# Patient Record
Sex: Female | Born: 1995 | Race: Black or African American | Hispanic: No | Smoking: Current every day smoker
Health system: Southern US, Community
[De-identification: ages and names within clinical notes are randomized; demographics above are authoritative.]

## PROBLEM LIST (undated history)

## (undated) ENCOUNTER — Emergency Department (HOSPITAL_COMMUNITY): Admission: EM | Payer: Medicaid Other | Source: Home / Self Care

## (undated) DIAGNOSIS — L309 Dermatitis, unspecified: Secondary | ICD-10-CM

## (undated) DIAGNOSIS — N83209 Unspecified ovarian cyst, unspecified side: Secondary | ICD-10-CM

## (undated) DIAGNOSIS — F32A Depression, unspecified: Secondary | ICD-10-CM

## (undated) DIAGNOSIS — F329 Major depressive disorder, single episode, unspecified: Secondary | ICD-10-CM

## (undated) DIAGNOSIS — T7840XA Allergy, unspecified, initial encounter: Secondary | ICD-10-CM

## (undated) DIAGNOSIS — J45909 Unspecified asthma, uncomplicated: Secondary | ICD-10-CM

## (undated) DIAGNOSIS — F419 Anxiety disorder, unspecified: Secondary | ICD-10-CM

## (undated) HISTORY — PX: OVARIAN CYST REMOVAL: SHX89

## (undated) HISTORY — DX: Unspecified ovarian cyst, unspecified side: N83.209

## (undated) HISTORY — DX: Allergy, unspecified, initial encounter: T78.40XA

---
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
## 2013-01-17 ENCOUNTER — Encounter: Payer: Self-pay | Admitting: Pediatrics

## 2013-01-17 ENCOUNTER — Ambulatory Visit (INDEPENDENT_AMBULATORY_CARE_PROVIDER_SITE_OTHER): Payer: Self-pay | Admitting: Clinical

## 2013-01-17 ENCOUNTER — Ambulatory Visit (INDEPENDENT_AMBULATORY_CARE_PROVIDER_SITE_OTHER): Payer: Self-pay | Admitting: Pediatrics

## 2013-01-17 VITALS — BP 98/60 | Ht 63.47 in | Wt 119.3 lb

## 2013-01-17 DIAGNOSIS — R319 Hematuria, unspecified: Secondary | ICD-10-CM

## 2013-01-17 DIAGNOSIS — L209 Atopic dermatitis, unspecified: Secondary | ICD-10-CM

## 2013-01-17 DIAGNOSIS — G4700 Insomnia, unspecified: Secondary | ICD-10-CM

## 2013-01-17 DIAGNOSIS — L2089 Other atopic dermatitis: Secondary | ICD-10-CM

## 2013-01-17 DIAGNOSIS — F411 Generalized anxiety disorder: Secondary | ICD-10-CM

## 2013-01-17 DIAGNOSIS — R69 Illness, unspecified: Secondary | ICD-10-CM

## 2013-01-17 DIAGNOSIS — Z113 Encounter for screening for infections with a predominantly sexual mode of transmission: Secondary | ICD-10-CM

## 2013-01-17 LAB — POCT URINALYSIS DIPSTICK: Urobilinogen, UA: NEGATIVE

## 2013-01-17 MED ORDER — TRIAMCINOLONE ACETONIDE 0.1 % EX CREA: TOPICAL_CREAM | Freq: Two times a day (BID) | CUTANEOUS | Status: DC

## 2013-01-17 NOTE — Patient Instructions (Addendum)
Eczema Atopic dermatitis, or eczema, is an inherited type of sensitive skin. Often people with eczema have a family history of allergies, asthma, or hay fever. It causes a red itchy rash and dry scaly skin. The itchiness may occur before the skin rash and may be very intense. It is not contagious. Eczema is generally worse during the cooler winter months and often improves with the warmth of summer. Eczema usually starts showing signs in infancy. Some children outgrow eczema, but it may last through adulthood. Flare-ups may be caused by:  Eating something or contact with something you are sensitive or allergic to.  Stress. DIAGNOSIS  The diagnosis of eczema is usually based upon symptoms and medical history. TREATMENT  Eczema cannot be cured, but symptoms usually can be controlled with treatment or avoidance of allergens (things to which you are sensitive or allergic to).  Controlling the itching and scratching.  Use over-the-counter antihistamines as directed for itching. It is especially useful at night when the itching tends to be worse.  Use over-the-counter steroid creams as directed for itching.  Scratching makes the rash and itching worse and may cause impetigo (a skin infection) if fingernails are contaminated (dirty).  Keeping the skin well moisturized with creams every day. This will seal in moisture and help prevent dryness. Lotions containing alcohol and water can dry the skin and are not recommended.  Limiting exposure to allergens.  Recognizing situations that cause stress.  Developing a plan to manage stress. HOME CARE INSTRUCTIONS   Take prescription and over-the-counter medicines as directed by your caregiver.  Do not use anything on the skin without checking with your caregiver.  Keep baths or showers short (5 minutes) in warm (not hot) water. Use mild cleansers for bathing. You may add non-perfumed bath oil to the bath water. It is best to avoid soap and bubble  bath.  Immediately after a bath or shower, when the skin is still damp, apply a moisturizing ointment to the entire body. This ointment should be a petroleum ointment. This will seal in moisture and help prevent dryness. The thicker the ointment the better. These should be unscented.  Keep fingernails cut short and wash hands often. If your child has eczema, it may be necessary to put soft gloves or mittens on your child at night.  Dress in clothes made of cotton or cotton blends. Dress lightly, as heat increases itching.  Avoid foods that may cause flare-ups. Common foods include cow's milk, peanut butter, eggs and wheat.  Keep a child with eczema away from anyone with fever blisters. The virus that causes fever blisters (herpes simplex) can cause a serious skin infection in children with eczema. SEEK MEDICAL CARE IF:   Itching interferes with sleep.  The rash gets worse or is not better within one week following treatment.  The rash looks infected (pus or soft yellow scabs).  You or your child has an oral temperature above 102 F (38.9 C).  Your baby is older than 3 months with a rectal temperature of 100.5 F (38.1 C) or higher for more than 1 day.  The rash flares up after contact with someone who has fever blisters. SEEK IMMEDIATE MEDICAL CARE IF:   Your baby is older than 3 months with a rectal temperature of 102 F (38.9 C) or higher.  Your baby is older than 3 months or younger with a rectal temperature of 100.4 F (38 C) or higher. Document Released: 06/06/2000 Document Revised: 09/01/2011 Document Reviewed: 04/11/2009 ExitCare  Patient Information 2014 [place], [place].  **Use Kenalog (triamcinolone cream) twice daily for flares. Do not use this on the face. You can use over the counter hydrocortisone cream on the face if needed. Continue to use Eucerin and Vaseline after showers. **You will be contacted with abnormal results.

## 2013-01-17 NOTE — Progress Notes (Addendum)
CC: New patient, eczema all over  HPI: Deanna [name] is a 17 yo F w/ h/o eczema since infancy who presents with diffuse eczematous rash. Per patient, she has previously been managed with Kenalog and prednisone prescribed in the ER during flares (last one month ago). As an infant, her eczema was diffuse. As she grew up, it improved to involve only her elbows, back of knees, and neck. Now it has been more diffuse for last year, involving the usual places and also now the scalp, back, face. She has been seen in the ED one month ago where she was prescribed Kenalog and a short course of steroids. These helped when she used them, but she has since run out of the medications. The symptoms returned within 1-2 weeks. She does report drainage from the rashes. It mostly appears bloody but sometimes has pus. Denies fevers but reports night sweats, which aren't new. One year ago she started using new laundry detergent. No other environmental changes. When she showers, she uses unscented [employer]. After showering, she uses Eucerin and Vaseline. She generally showers every three days, which she started doing when the eczema got worse. This has not helped.   She also reports difficulty sleeping for several years. She states that she is up through the night and goes to sleep for about two hours in the mornings. This is a change from six months ago when she slept for about four hours per day. When she is awake at night, she is often anxious. She worries some for her mother, who "goes clubbing a lot in dangerous places where she could get shot." She feels tired during the day. She sometimes takes 4x Benadryl tablets to help her sleep. She did this yesterday AM and slept till 10 PM. She says that she drinks coffee infrequently and otherwise avoids caffeine. She does take "cat naps" throughout the day. She saw a therapist for this issue about 3-4 years ago. She was diagnosed with depression and was prescribed some medicines "for  sleep", which helped. She cannot recall the name of these medications. Her father's side of the family suffers from insomnia. Both parents carry the diagnosis of bipolar disorder.   She has been wetting the bed x2 weeks. She has not had incontinence during the day though she reports urgency and frequency. She has clots when she pees during the day for the last week. No dysuria. Reports monthly periods that last 5 days at a time. Not currently on her period.  ROS: Increased thirst, weight gain.  PMH: Past Medical History  Diagnosis Date  . Eczema   . Asthma   . Allergy   . Ovarian cyst     resolved     PSH: Past Surgical History  Procedure Laterality Date  . Ovarian cyst removal       MEDICATIONS: Outpatient Encounter Prescriptions as of 01/17/2013  Medication Sig Dispense Refill  . albuterol (PROVENTIL HFA;VENTOLIN HFA) 108 (90 BASE) MCG/ACT inhaler Inhale 2 puffs into the lungs every 6 (six) hours as needed for wheezing or shortness of breath.       . triamcinolone cream (KENALOG) 0.1 % Apply topically 2 (two) times daily.  30 g  0   No facility-administered encounter medications on file as of 01/17/2013.     ALLERGIES: Allergies  Allergen Reactions  . Dairy Aid (Lactase) Hives       . Penicillins Rash     FH: Family History  Problem Relation Age of Onset  .  Asthma Mother   . Bipolar disorder Mother   . Asthma Father   . Hypertension Father   . Bipolar disorder Father   . Hypertension Paternal Grandmother   . Diabetes Paternal Grandmother   . Asthma Paternal Grandmother      SH: History   Social History  . Marital Status: Single   Occupational History  . Student   Social History Main Topics  . Smoking status: Never Smoker   . Alcohol Use: No  . Drug Use: No  . Sexually Active: Not Currently -- Female partner(s)     PHYSICAL EXAM: VS: BP 98/60  Ht 5' 3.47" (1.612 m)  Wt 119 lb 4.3 oz (54.1 kg)  BMI 20.82 kg/m2 Physical Examination: General  appearance - alert, well appearing, and in no distress Mental status - alert, oriented to person, place, and time, somewhat flat affect Eyes - pupils equal and reactive, extraocular eye movements intact, sclera anicteric Nose - normal and patent, no erythema, discharge or polyps Mouth - mucous membranes moist, pharynx normal without lesions Neck - supple, no significant adenopathy Lymphatics - no palpable lymphadenopathy Chest - clear to auscultation, no wheezes, rales or rhonchi, symmetric air entry Heart - normal rate, regular rhythm, normal S1, S2, no murmurs, rubs, clicks or gallops Abdomen - NABS, soft, nontender, nondistended, no masses or organomegaly Skin - multiple eczematous patches on posterior neck, back, chest, abdomen, arms, axillae, knees bilaterally; few excoriated areas; no purulence  SCREENING: PHQ-9: 22 - has had thoughts that she would be better off dead- last was 2 weeks ago, no plan  LABS: U/A (01/17/13): - Color: red - Clarity: cloudy - SG: 1.030 - pH: 5 - LE: 2+ - Nitrite: negative - Protein: 2+ - Glucose: normal - Ketones: neg - Urobilinogen: neg - Bilirubin: neg - Blood: 250  ASSESSMENT AND PLAN: Deanna [name] is a 17 yo F w/ h/o asthma and eczema who presents today for eczema flare and to establish care. On further history, she also suffers from significant insomnia, anxiety, and depression. She also has symptoms and labs c/w UTI.  **Atopic dermatitis: Diffuse atopic dermatitis. No e/o superimposed bacterial infection. - Prescribed Kenalog cream to be used twice daily during flares - Facial atopic dermatitis is relatively mild; recommended OTC hydrocortisone BID prn. If no improvement, can try more potent topical steroid. - Encouraged her to continue to use Eucerin and Vaseline.  **UTI, nocturnal enuresis: Symptoms and labs c/w UTI. Hematuria is likely related to those, though abnormal menstruation could also be confused for hematuria. STI could also be  causing vaginal bleeding that could be confused for hematuria. Nocturnal enuresis could be caused by UTI, diabetes mellitus, medications (though she is not currently taking any medications or substances that should cause enuresis), psychiatric issues. No glucose in the urine, which is reassuring.  - Urinalysis result returned after she had been discharged from the office. Cipro 250 mg BID x3 days has been prescribed. I attempted to reach the patient at the number listed in Epic [phone]) and one on file at the pharmacy [phone]). Neither number was connected anymore. Patient had not yet picked up her Kenalog prescription, so she will hopefully be able to pick up the Cipro as well. She is to be seen in clinic on 8/8 for f/u. - Urine culture sent - Screening for GC/Chlamydia  - Consider screening for DM in the future following treatment for UTI if nocturnal enuresis persists. No glucosuria, which is reassuring.  **Anxiety/depression: Based on her PHQ-9  score, she has severe depression. She has had passive suicidal thoughts, last two weeks ago, but is not currently having suicidal thoughts and has no plans. She has seen a therapist in the past and is not currently interested in pursuing therapy. Her friend is on an anti-depressant, which is not helping, so Deanna [name] thinks that medication may not be helpful for her either. She met with [facility] today, who helped obtain this information. - Follow-up in 1 week with Deanna [name] for physical and to check in. She will also meet with Deanna [name] at that appointment. - She will be referred to adolescent medicine (Deanna [name]) at that visit for assistance with further management of anxiety and depression.  **Insomnia: Suspect that this is related to anxiety and depression as well as poor sleep hygiene, including daytime naps. - Discussed importance of good sleep hygiene; however, I believe that this will only improve with treatment of underlying depression and  anxiety.  **Healthcare maintenance: - She is agreeable to screening for GC/Chlamydia - F/u 8/8 for physical exam  Deanna [name], M.D. Med-Peds PGY-3        ADDENDUM: Urine culture results reviewed. Culture was positive for E. Coli s/t ciprofloxacin, which was the antibiotic prescribed on 7/28.

## 2013-01-17 NOTE — Progress Notes (Signed)
Referring Provider: Dr. Leotis Shames & Dr. Renae Gloss Length of visit: 3:30pm -4:00pm (30 min)  PRESENTING CONCERNS:  Yasenia presented for an acute visit for her eczema.  During the visit, she shared with the physician her depressive symptoms including feeling depressed, difficulty sleeping, and little interest in doing things.  Gresia reported she cannot sleep because of her eczema.  GOALS:  Increase her hours of sleep by treating her eczema.  INTERVENTIONS:  LCSW built rapport and assessed for current symptoms of depression & anxiety.  LCSW had Emile complete the PHQ-SADS & reviewed it with her.  LCSW assessed for suicide risk and informed her of the various resources available to her including the crisis centers in [place].  LCSW also obtained information from Kenzie's girlfriend who was present, with Mandie's permission.  SCREENS/ASSESSMENT TOOLS COMPLETED: PHQ-SADS Results PHQ-15 for Somatic Complaints = 13 (High) GAD-7 for Anxiety = 7 (Moderate) PHQ-9 for Depression = 22 (Severe) Anxiety Attacks = None Reported it is "extremely difficult" for her to do her work, take care of things at home or get along with other people.   OUTCOME:  Haadiya was completing the PHQ-SADS when LCSW entered the room.  Becka was having difficulty reading the questions and had Gwen, her girlfriend, help her.  Novia reported she's had difficulty concentrating and has had difficulty in school with reading.  Esra is currently not in school, she dropped out of [employer].  She is living with [facility] & Gwen's mother.  Jazell reported she's been depressed since 8th grade when she was diagnosed with depression.  Remmi reported she had therapy at that time and was advised to also take medications but she chose not to since she thought it would make her "retarded."  Sarely thought that if she took the medication she would have to be in special classes because of her anger problems at that time.   Kharlie reported she does not remember what type of medication it was.  Philamena reported severe symptoms of depression.  Zenora did report thoughts of hurting herself about two weeks ago but denied any current suicidal ideations.  Alaysiah denied any attempts to hurt herself and any plans to do it.  Jadalynn reported she wants to get more sleep and it would help if her eczema was treated.  Damoni did not want counseling at this time but open to LCSW following up with her at the next visit.    Drucilla was open to establishing care at [facility] with a primary care physician and consulting with the Adolescent Specialist.  PLAN:  Scheduled a physical exam with Dr. Jena Gauss and a follow up with this LCSW on 01/28/13.  LCSW to assess further for major depressive disorder.

## 2013-01-18 LAB — GC/CHLAMYDIA PROBE AMP: GC Probe RNA: NEGATIVE

## 2013-01-18 NOTE — Progress Notes (Signed)
I saw and evaluated this patient,performing key elements of the service.I developed the management plan that is described in Dr Pandelidis's note,and I agree with the content.  Olakunle B. Camay Pedigo, MD  

## 2013-01-18 NOTE — Progress Notes (Signed)
I saw and evaluated this patient,performing key elements of the service.I developed the management plan that is described in Dr Pandelidis's note,and I agree with the content.  Olakunle B. Bronwyn Belasco, MD  

## 2013-01-20 LAB — URINE CULTURE

## 2013-01-28 ENCOUNTER — Ambulatory Visit: Payer: Self-pay | Admitting: Pediatrics

## 2013-01-28 ENCOUNTER — Other Ambulatory Visit: Payer: Self-pay | Admitting: Clinical

## 2013-02-04 ENCOUNTER — Telehealth: Payer: Self-pay | Admitting: Pediatrics

## 2013-02-04 NOTE — Telephone Encounter (Signed)
Pt was due for f/u appt with me on 8/8 and No Showed.  Called pt to re-schedule appointment and confirm that she received cipro rx for E.coli UTI.  Was unable to reach her but did reach her mother (Ms. Slezak) and discussed that she missed an appointment for a physical exam and I wanted to confirm she was able to pick up her prescription (I only mentioned eczema cream); her mother stated that she will remind patient to call and re-schedule PE and f/u re: meds.

## 2013-02-16 ENCOUNTER — Encounter: Payer: Self-pay | Admitting: Pediatrics

## 2013-10-25 ENCOUNTER — Emergency Department (HOSPITAL_COMMUNITY)
Admission: EM | Admit: 2013-10-25 | Discharge: 2013-10-25 | Disposition: A | Discharge status: Home / Self Care | Payer: Medicaid Other | Attending: Emergency Medicine | Admitting: Emergency Medicine

## 2013-10-25 ENCOUNTER — Encounter (HOSPITAL_COMMUNITY): Payer: Self-pay | Admitting: Emergency Medicine

## 2013-10-25 DIAGNOSIS — Z8742 Personal history of other diseases of the female genital tract: Secondary | ICD-10-CM | POA: Insufficient documentation

## 2013-10-25 DIAGNOSIS — J45909 Unspecified asthma, uncomplicated: Secondary | ICD-10-CM | POA: Insufficient documentation

## 2013-10-25 DIAGNOSIS — Z880 Allergy status to penicillin: Secondary | ICD-10-CM | POA: Insufficient documentation

## 2013-10-25 DIAGNOSIS — T4995XA Adverse effect of unspecified topical agent, initial encounter: Secondary | ICD-10-CM | POA: Insufficient documentation

## 2013-10-25 DIAGNOSIS — T783XXA Angioneurotic edema, initial encounter: Secondary | ICD-10-CM

## 2013-10-25 DIAGNOSIS — R0602 Shortness of breath: Secondary | ICD-10-CM | POA: Insufficient documentation

## 2013-10-25 DIAGNOSIS — Z872 Personal history of diseases of the skin and subcutaneous tissue: Secondary | ICD-10-CM | POA: Insufficient documentation

## 2013-10-25 MED ORDER — PREDNISONE 20 MG PO TABS
40.0000 mg | ORAL_TABLET | Freq: Once | ORAL | Status: AC
  Administered 2013-10-25: 40 mg via ORAL
  Filled 2013-10-25: qty 2

## 2013-10-25 MED ORDER — FAMOTIDINE 20 MG PO TABS
10.0000 mg | ORAL_TABLET | Freq: Once | ORAL | Status: AC
  Administered 2013-10-25: 10 mg via ORAL
  Filled 2013-10-25: qty 1

## 2013-10-25 MED ORDER — FAMOTIDINE 20 MG PO TABS: 20.0000 mg | ORAL_TABLET | Freq: Two times a day (BID) | ORAL | Status: DC

## 2013-10-25 MED ORDER — DIPHENHYDRAMINE HCL 25 MG PO TABS: 25.0000 mg | ORAL_TABLET | Freq: Four times a day (QID) | ORAL | Status: DC

## 2013-10-25 MED ORDER — EPINEPHRINE 0.3 MG/0.3ML IJ SOAJ
0.3000 mg | Freq: Once | INTRAMUSCULAR | Status: AC
  Administered 2013-10-25: 0.3 mg via INTRAMUSCULAR
  Filled 2013-10-25: qty 0.3

## 2013-10-25 MED ORDER — PREDNISONE 10 MG PO TABS: 20.0000 mg | ORAL_TABLET | Freq: Every day | ORAL | Status: DC

## 2013-10-25 MED ORDER — DIPHENHYDRAMINE HCL 25 MG PO CAPS
25.0000 mg | ORAL_CAPSULE | Freq: Once | ORAL | Status: AC
  Administered 2013-10-25: 25 mg via ORAL
  Filled 2013-10-25: qty 1

## 2013-10-25 NOTE — Discharge Instructions (Signed)
Angioedema Angioedema is a sudden swelling of tissues, often of the skin. It can occur on the face or genitals or in the abdomen or other body parts. The swelling usually develops over a short period and gets better in 24 to 48 hours. It often begins during the night and is found when the person wakes up. The person may also get red, itchy patches of skin (hives). Angioedema can be dangerous if it involves swelling of the air passages.  Depending on the cause, episodes of angioedema may only happen once, come back in unpredictable patterns, or repeat for several years and then gradually fade away.  CAUSES  Angioedema can be caused by an allergic reaction to various triggers. It can also result from nonallergic causes, including reactions to drugs, immune system disorders, viral infections, or an abnormal gene that is passed to you from your parents (hereditary). For some people with angioedema, the cause is unknown.  Some things that can trigger angioedema include:   Foods.   Medicines, such as ACE inhibitors, ARBs, nonsteroidal anti-inflammatory agents, or estrogen.   Latex.   Animal saliva.   Insect stings.   Dyes used in X-rays.   Mild injury.   Dental work.  Surgery.  Stress.   Sudden changes in temperature.   Exercise. SIGNS AND SYMPTOMS   Swelling of the skin.  Hives. If these are present, there is also intense itching.  Redness in the affected area.   Pain in the affected area.  Swollen lips or tongue.  Breathing problems. This may happen if the air passages swell.  Wheezing. If internal organs are involved, there may be:   Nausea.   Abdominal pain.   Vomiting.   Difficulty swallowing.   Difficulty passing urine. DIAGNOSIS   Your health care provider will examine the affected area and take a medical and family history.  Various tests may be done to help determine the cause. Tests may include:  Allergy skin tests to see if the problem  is an allergic reaction.   Blood tests to check for hereditary angioedema.   Tests to check for underlying diseases that could cause the condition.   A review of your medicines, including over the counter medicines, may be done. TREATMENT  Treatment will depend on the cause of the angioedema. Possible treatments include:   Removal of anything that triggered the condition (such as stopping certain medicines).   Medicines to treat symptoms or prevent attacks. Medicines given may include:   Antihistamines.   Epinephrine injection.   Steroids.   Hospitalization may be required for severe attacks. If the air passages are affected, it can be an emergency. Tubes may need to be placed to keep the airway open. HOME CARE INSTRUCTIONS   Only take over-the-counter or prescription medicines as directed by your health care provider.  If you were given medicines for emergency allergy treatment, always carry them with you.  Wear a medical bracelet as directed by your health care provider.   Avoid known triggers. SEEK MEDICAL CARE IF:   You have repeat attacks of angioedema.   Your attacks are more frequent or more severe despite preventive measures.   You have hereditary angioedema and are considering having children. It is important to discuss the risks of passing the condition on to your children with your health care provider. SEEK IMMEDIATE MEDICAL CARE IF:   You have severe swelling of the mouth, tongue, or lips.  You have difficulty breathing.   You have difficulty swallowing.     You faint. MAKE SURE YOU:  Understand these instructions.  Will watch your condition.  Will get help right away if you are not doing well or get worse. Document Released: 08/18/2001 Document Revised: 03/30/2013 Document Reviewed: 01/31/2013 [employer] Patient Information 2014 [employer].  

## 2013-10-25 NOTE — ED Notes (Signed)
Pt reports feels like lip swelling is decreasing.  No shortness of breath, throat or tongue swelling.

## 2013-10-25 NOTE — ED Notes (Signed)
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

## 2013-10-25 NOTE — ED Provider Notes (Signed)
CSN: [id number]     Arrival date & time 10/25/13  1847 History  This chart was scribed for Felicie Mornavid Demetrica Zipp, NP working with Rolland PorterMark James, MD, by Beverly MilchJ Harrison Collins ED Scribe. This patient was seen in room TR09C/TR09C and the patient's care was started at 8:37 PM.    Chief Complaint  Patient presents with  . Allergic Reaction    The history is provided by the patient. No language interpreter was used.   HPI Comments: Deanna [name] is a 18 y.o. female with a h/o asthma and eczema who presents to the Emergency Department complaining of a swollen lip that began yesterday as the result of a possible allergic reaction. Pt denies coming into contact with something she's allergic to or biting her lip. She states she is allergic to dairy products, coconut, and penicillin. She states her lower lip started as a small bump but has since swollen up. She also reports some itchiness and SOB, which has resolved since last night. She also states a lymph node on the left side of her neck has swollen up. She denies difficulty swallowing. Pt reports she tried to apply ice and heat with no results. Pt denies daily medications.    Past Medical History  Diagnosis Date  . Eczema   . Eczema   . Asthma   . Allergy   . Ovarian cyst     resolved    Past Surgical History  Procedure Laterality Date  . Ovarian cyst removal      Family History  Problem Relation Age of Onset  . Asthma Mother   . Bipolar disorder Mother   . Asthma Father   . Hypertension Father   . Bipolar disorder Father   . Hypertension Paternal Grandmother   . Diabetes Paternal Grandmother   . Asthma Paternal Grandmother     History  Substance Use Topics  . Smoking status: Never Smoker   . Smokeless tobacco: Not on file  . Alcohol Use: No    OB History   Grav Para Term Preterm Abortions TAB SAB Ect Mult Living                  Review of Systems  HENT: Positive for facial swelling. Negative for rhinorrhea, sore throat and trouble  swallowing.   Respiratory: Positive for shortness of breath. Negative for cough, choking and wheezing.   Gastrointestinal: Negative for nausea and vomiting.  Musculoskeletal: Negative for arthralgias and myalgias.  Allergic/Immunologic: Positive for food allergies.  Neurological: Negative for light-headedness and numbness.  All other systems reviewed and are negative.    Allergies  Dairy aid and Penicillins  Home Medications   Prior to Admission medications   Not on File    Triage Vitals: BP 101/62  Pulse 82  Temp(Src) 98.7 F (37.1 C) (Oral)  Resp 18  Ht 5\' 3"  (1.6 m)  Wt 117 lb (53.071 kg)  BMI 20.73 kg/m2  SpO2 100%  LMP 10/02/2013   Physical Exam  Nursing note and vitals reviewed. Constitutional: She is oriented to person, place, and time. She appears well-developed and well-nourished. No distress.  HENT:  Head: Normocephalic and atraumatic.  Mouth/Throat: No oropharyngeal exudate.  Angioedema to the left side of lower lip  Eyes: Conjunctivae and EOM are normal. Pupils are equal, round, and reactive to light. No scleral icterus.  Neck: Normal range of motion. Neck supple. No thyromegaly present.  Cardiovascular: Normal rate, regular rhythm and normal heart sounds.  Exam reveals no gallop and  no friction rub.   No murmur heard. Pulmonary/Chest: Effort normal and breath sounds normal. No stridor. She has no wheezes. She has no rales. She exhibits no tenderness.  Abdominal: Soft. Bowel sounds are normal. She exhibits no distension. There is no tenderness. There is no rebound.  Musculoskeletal: Normal range of motion. She exhibits no edema.  Lymphadenopathy:    She has no cervical adenopathy.  Neurological: She is alert and oriented to person, place, and time. She has normal reflexes. She exhibits normal muscle tone. Coordination normal.  Skin: Skin is warm and dry. No rash noted. She is not diaphoretic. No erythema.  Psychiatric: She has a normal mood and affect. Her  behavior is normal.    ED Course  Procedures (including critical care time)   DIAGNOSTIC STUDIES: Oxygen Saturation is 100% on RA, normal by my interpretation.     COORDINATION OF CARE: 8:43 PM- Pt advised of plan for treatment and pt agrees.   Labs Review Labs Reviewed - No data to display   Imaging Review No results found.    EKG Interpretation None      MDM   Final diagnoses:  None    Angioedema of lower lip.  Not on ACEI.  No respiratory involvement.  Discussed with Dr. Janace HoardJames--recommends adding IM epinephrine to treatment plan of antihistamines and steroids.   I personally performed the services described in this documentation, which was scribed in my presence. The recorded information has been reviewed and is accurate.    Jimmye Normanavid John Finis Hendricksen, NP 10/26/13 [phone]

## 2013-10-29 NOTE — ED Provider Notes (Addendum)
Medical screening examination/treatment/procedure(s) were performed by non-physician practitioner and as supervising physician I was immediately available for consultation/collaboration.   EKG Interpretation None         EKG Interpretation None        Rolland PorterMark Gerrit Rafalski, MD 10/29/13 1652  Rolland PorterMark Kostas Marrow, MD 11/12/13 [phone]

## 2013-11-02 ENCOUNTER — Emergency Department (HOSPITAL_COMMUNITY)
Admission: EM | Admit: 2013-11-02 | Discharge: 2013-11-02 | Disposition: A | Discharge status: Home / Self Care | Payer: Medicaid Other | Attending: Emergency Medicine | Admitting: Emergency Medicine

## 2013-11-02 ENCOUNTER — Emergency Department (HOSPITAL_COMMUNITY): Payer: Medicaid Other

## 2013-11-02 ENCOUNTER — Encounter (HOSPITAL_COMMUNITY): Payer: Self-pay | Admitting: Emergency Medicine

## 2013-11-02 DIAGNOSIS — S63509A Unspecified sprain of unspecified wrist, initial encounter: Secondary | ICD-10-CM | POA: Insufficient documentation

## 2013-11-02 DIAGNOSIS — IMO0002 Reserved for concepts with insufficient information to code with codable children: Secondary | ICD-10-CM | POA: Insufficient documentation

## 2013-11-02 DIAGNOSIS — Y92410 Unspecified street and highway as the place of occurrence of the external cause: Secondary | ICD-10-CM | POA: Insufficient documentation

## 2013-11-02 DIAGNOSIS — Z79899 Other long term (current) drug therapy: Secondary | ICD-10-CM | POA: Insufficient documentation

## 2013-11-02 DIAGNOSIS — Z3202 Encounter for pregnancy test, result negative: Secondary | ICD-10-CM | POA: Insufficient documentation

## 2013-11-02 DIAGNOSIS — S93409A Sprain of unspecified ligament of unspecified ankle, initial encounter: Secondary | ICD-10-CM | POA: Insufficient documentation

## 2013-11-02 DIAGNOSIS — S93402A Sprain of unspecified ligament of left ankle, initial encounter: Secondary | ICD-10-CM

## 2013-11-02 DIAGNOSIS — Y9389 Activity, other specified: Secondary | ICD-10-CM | POA: Insufficient documentation

## 2013-11-02 DIAGNOSIS — Z880 Allergy status to penicillin: Secondary | ICD-10-CM | POA: Insufficient documentation

## 2013-11-02 DIAGNOSIS — J45909 Unspecified asthma, uncomplicated: Secondary | ICD-10-CM | POA: Insufficient documentation

## 2013-11-02 DIAGNOSIS — Z872 Personal history of diseases of the skin and subcutaneous tissue: Secondary | ICD-10-CM | POA: Insufficient documentation

## 2013-11-02 DIAGNOSIS — Z8742 Personal history of other diseases of the female genital tract: Secondary | ICD-10-CM | POA: Insufficient documentation

## 2013-11-02 DIAGNOSIS — S63501A Unspecified sprain of right wrist, initial encounter: Secondary | ICD-10-CM

## 2013-11-02 LAB — POC URINE PREG, ED: Preg Test, Ur: NEGATIVE

## 2013-11-02 MED ORDER — IBUPROFEN 600 MG PO TABS: 600.0000 mg | ORAL_TABLET | Freq: Four times a day (QID) | ORAL | Status: DC | PRN
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
## 2013-11-02 NOTE — Discharge Instructions (Signed)
Please call and set up an appointment with hand specialist and orthopedics Please keep wrist and ankle in brace at all times Please rest, ice, elevate above heart level Please avoid any physical or strenuous activity Please take medications as prescribed Please continue to monitor symptoms closely and if symptoms are to worsen or change (fever greater than 101, chills, chest pain, shortness of breath, difficulty breathing, numbness, tingling, worsening changes to pain symptoms, fall, injury, loss of sensation, headache, dizziness) please report back to the ED immediately   Ankle Sprain An ankle sprain is an injury to the strong, fibrous tissues (ligaments) that hold your ankle bones together.  HOME CARE   Put ice on your ankle for 1 2 days or as told by your doctor.  Put ice in a plastic bag.  Place a towel between your skin and the bag.  Leave the ice on for 15-20 minutes at a time, every 2 hours while you are awake.  Only take medicine as told by your doctor.  Raise (elevate) your injured ankle above the level of your heart as much as possible for 2 3 days.  Use crutches if your doctor tells you to. Slowly put your own weight on the affected ankle. Use the crutches until you can walk without pain.  If you have a plaster splint:  Do not rest it on anything harder than a pillow for 24 hours.  Do not put weight on it.  Do not get it wet.  Take it off to shower or bathe.  If given, use an elastic wrap or support stocking for support. Take the wrap off if your toes lose feeling (numb), tingle, or turn cold or blue.  If you have an air splint:  Add or let out air to make it comfortable.  Take it off at night and to shower and bathe.  Wiggle your toes and move your ankle up and down often while you are wearing it. GET HELP RIGHT AWAY IF:   Your toes lose feeling (numb) or turn blue.  You have severe pain that is increasing.  You have rapidly increasing bruising or  puffiness (swelling).  Your toes feel very cold.  You lose feeling in your foot.  Your medicine does not help your pain. MAKE SURE YOU:   Understand these instructions.  Will watch your condition.  Will get help right away if you are not doing well or get worse. Document Released: 11/26/2007 Document Revised: 03/03/2012 Document Reviewed: 12/22/2011 [employer] Patient Information 2014 [place], [place].  Wrist Pain Wrist injuries are frequent in adults and children. A sprain is an injury to the ligaments that hold your bones together. A strain is an injury to muscle or muscle cord-like structures (tendons) from stretching or pulling. Generally, when wrists are moderately tender to touch following a fall or injury, a break in the bone (fracture) may be present. Most wrist sprains or strains are better in 3 to 5 days, but complete healing may take several weeks. HOME CARE INSTRUCTIONS   Put ice on the injured area.  Put ice in a plastic bag.  Place a towel between your skin and the bag.  Leave the ice on for 15-20 minutes, 03-04 times a day, for the first 2 days.  Keep your arm raised above the level of your heart whenever possible to reduce swelling and pain.  Rest the injured area for at least 48 hours or as directed by your caregiver.  If a splint or elastic bandage  has been applied, use it for as long as directed by your caregiver or until seen by a caregiver for a follow-up exam.  Only take over-the-counter or prescription medicines for pain, discomfort, or fever as directed by your caregiver.  Keep all follow-up appointments. You may need to follow up with a specialist or have follow-up X-rays. Improvement in pain level is not a guarantee that you did not fracture a bone in your wrist. The only way to determine whether or not you have a broken bone is by X-ray. SEEK IMMEDIATE MEDICAL CARE IF:   Your fingers are swollen, very red, white, or cold and blue.  Your fingers are  numb or tingling.  You have increasing pain.  You have difficulty moving your fingers. MAKE SURE YOU:   Understand these instructions.  Will watch your condition.  Will get help right away if you are not doing well or get worse. Document Released: 03/19/2005 Document Revised: 09/01/2011 Document Reviewed: 07/31/2010 [facility] Patient Information 2014 [place]Care, [place].   Emergency Department Resource Guide 1) Find a Doctor and Pay Out of Pocket Although you won't have to find out who is covered by your insurance plan, it is a good idea to ask around and get recommendations. You will then need to call the office and see if the doctor you have chosen will accept you as a new patient and what types of options they offer for patients who are self-pay. Some doctors offer discounts or will set up payment plans for their patients who do not have insurance, but you will need to ask so you aren't surprised when you get to your appointment.  2) Contact Your Local Health Department Not all health departments have doctors that can see patients for sick visits, but many do, so it is worth a call to see if yours does. If you don't know where your local health department is, you can check in your phone book. The CDC also has a tool to help you locate your state's health department, and many state websites also have listings of all of their local health departments.  3) Find a Walk-in Clinic If your illness is not likely to be very severe or complicated, you may want to try a walk in clinic. These are popping up all over the country in pharmacies, drugstores, and shopping centers. They're usually staffed by nurse practitioners or physician assistants that have been trained to treat common illnesses and complaints. They're usually fairly quick and inexpensive. However, if you have serious medical issues or chronic medical problems, these are probably not your best option.  No Primary Care Doctor: - Call  Health Connect at  [phone] - they can help you locate a primary care doctor that  accepts your insurance, provides certain services, etc. - Physician Referral Service- [phone]  Chronic Pain Problems: Organization         Address  Phone   Notes  [facility]  [phone] Patients need to be referred by their primary care doctor.   Medication Assistance: Organization         Address  Phone   Notes  [facility] Hospital[facility] Program [address] [place], [place] [phone] --Must be a resident of [place] -- Must have NO insurance coverage whatsoever (no Medicaid/ Medicare, etc.) -- The pt. MUST have a primary care doctor that directs their care regularly and follows them in the community   [facility]  [phone]  [employer]  [phone]    Agencies that provide inexpensive medical care: Organization         Address  Phone   Notes  [facility]  [phone]   [facility]    [phone]   [facility] [address] [place], [place] [address] [phone]   [facility] [address] [place] [place] [phone]   Planned Parenthood    [phone]   [facility]    [phone]   [facility]  [address], [place] Phone:  [phone], Fax:  [phone] Hours of Operation:  9 am - 6 pm, M-F.  Also accepts Medicaid/Medicare and self-pay.  [facility]  [address], [place] Phone: [phone], Fax: [phone]. Hours of Operation:  8:30 am - 5:30 pm, M-F.  Also accepts Medicaid and self-pay.  [facility] [place] [address] [place] Phone: [phone]   [facility] [address] Natasha Bence [place], [place] [phone], Ext. [phone] Mondays & Thursdays: 7-9 AM.  First 15 patients are seen on a first come, first serve  basis.    Medicaid-accepting [facility] Providers:  Organization         Address  Phone   Notes  [facility] [address], [place] [phone] Also accepts self-pay patients.  [facility] [address] Laurell Josephs [place], [place]  [phone]   [facility] [address], [place] [phone]   [facility] [address] [place] [phone]   Renaye Rakers [address], [place]   [phone] Only accepts [place] Access [place] patients after they have their name applied to their card.   Self-Pay (no insurance) in [facility]:  Organization         Address  Phone   Notes  Sickle Cell Patients, [facility] [address] [place], [place] [phone]   [facility] [address] [place], [place] [phone]   [facility]  [address],  [phone]   [facility]/Dr. Osei-Bonsu  [address] [place] or [address], [place] [phone] Phone number for both [place] and [place] locations is the same.  [facility] [address], [place] [phone]   [facility] [address], [place] or [address] [phone]   [facility] [address], [place] [phone], phone; [phone], fax Sees patients 1st and 3rd Saturday of every month.  Must not qualify for public or private insurance (i.e. Medicaid, Medicare, [place] Health Choice, Veterans' Benefits)  Household income should be no more than 200% of the poverty level The clinic cannot treat you if you are pregnant or think you are pregnant  Sexually transmitted diseases are not treated at the clinic.    Dental Care: Organization         Address  Phone  Notes  [facility] Department of [facility] [address] [place], [place] [phone] Accepts children up to age 81 who are enrolled in [place] or [place] Health Choice; pregnant women with a Medicaid card;  and children who have applied for Medicaid or [place] Health Choice, but were declined, whose parents can pay a reduced fee at time of service.  [facility] of [facility]  [address], [place] [phone] Accepts children up to age 21 who are enrolled in [place] or [place] Health Choice; pregnant women with a Medicaid card; and children who have applied for Medicaid or [place] Health Choice, but were declined, whose parents can pay a reduced fee at time of service.  [facility]  [address] [place], [place] [phone] Patients are seen by appointment only. Walk-ins are not accepted. [facility] will see patients 20 years of age and older. Monday - Tuesday (8am-5pm) Most Wednesdays (8:30-5pm) $30 per visit, cash only  [facility] Adult Dental Access PROGRAM  [address], [facility] [phone] Patients are seen by appointment only. Walk-ins are not accepted. [facility] will see patients 38 years of age and older. One Wednesday Evening (Monthly: Volunteer Based).  $30 per visit, cash only  [employer]  [phone] for adults; Children under age 33, call Graduate Pediatric Dentistry at [phone]. Children aged 12-14, please call [phone] to request a pediatric application.  Dental services are provided in all areas of dental care including fillings, crowns and bridges, complete and partial dentures, implants, gum treatment, root canals, and extractions. Preventive care is also provided. Treatment is provided to both adults and children. Patients are selected via a lottery and there is often a waiting list.   [facility] [address] [place]  [phone]  [email or web address]   [facility] [address] [place], [place] [phone], Ext. [phone] Second and Fourth Thursday of each month, opens at 6:30 AM; Clinic ends at 9 AM.  Patients are seen on a first-come first-served basis, and a limited number are seen during each clinic.   [facility]  [address] Ether Griffins [place], [place] [phone]   Eligibility Requirements You must have lived in [place], [place], or [place] counties for at least the last three months.   You cannot be eligible for state or federal sponsored [place], including [employer], [place], or [employer].   You generally cannot be eligible for healthcare insurance through your employer.    How to apply: Eligibility screenings are held every Tuesday and Wednesday afternoon from 1:00 pm until 4:00 pm. You do not need an appointment for the interview!  [facility] [address], [place], [place] [phone]   [facility] Health Department  [phone]   [facility] Health Department  [phone]   [facility] Health Department  [phone]    Behavioral Health Resources in the Community: Intensive Outpatient Programs Organization         Address  Phone  Notes  [facility] Services [address], [place], [place] [phone]   [facility] Outpatient [address], [place], [place] [phone]   [facility] [address] [place], [place]  [phone]   [facility] [address],  [place], [place] [phone] or [phone]   Substance Abuse Resources Organization         Address  Phone  Notes  [facility] Drug Services  [phone]   [facility]  [phone]   [facility]  [phone]   [facility]  [phone]   Residential &  Outpatient Substance Abuse Program  [phone]   Psychological Services Organization          Address  Phone  Notes  [facility] Behavioral Health  [phone]   [facility] Services  [phone]   [facility] [phone] [address], [place] [phone] or [phone]    Mobile Crisis Teams Organization         Address  Phone  Notes  [facility], Mobile Crisis Care Unit  [phone]   [facility]  [address] [place], [place] [phone]   Doristine Locks [address] [place] [phone]    Self-Help/Support Groups Organization         Address  Phone             Notes  [facility] [place] - variety of support groups  [phone]- [phone] Call for more information  Narcotics Anonymous (NA), [facility] [address], [employer] [place]  2 meetings at this location   Statistician         Address  Phone  Notes  [facility] [address],    [place]  [phone]   [facility]  [address], [place] [address], [place], [place] [phone]   [facility] [address] [place], [place] [phone] Admissions: 8am-3pm M-F  [facility] [address],    [place], [place] [phone]   [facility] [address] [place], [place], [place] [phone]   [facility] [address],  [place], [place] [phone]   [facility] - Intensive Outpatient [address] Laurell Josephs [address], [place], [place] [phone]   [facility] ([facility] [address] [place]  [place], [place] [phone] or [phone]   [facility] ([facility]) [address] [place], [place] [phone] Accepts Medicaid  [facility] [place] [address]  [place] [phone] Substance Abuse/Addiction Treatment   [facility] Organization         Address  Phone  Notes  [facility]  [phone]   Angie Fava, PhD [address] Ervin Knack [place], [place]   [phone] or [phone]   [facility] Behavioral   [address] [place], [place] [phone]   [facility] [address] [place], [place] [phone] Insurance/Medicaid/sponsorship through [facility] and Families [address]                                    [place], [place] [phone] Therapy/tele-psych/case  [facility] [address], [place] [phone]    Dr. Lolly Mustache  [phone]   [facility]  [employer] [facility] Dept. 1) [address] [place] 2) [address], [place] 3)  [address], [place] [phone]  [phone]   [facility] Child Abuse Hotline 234 828 2837 or [phone] (After Hours)
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
## 2013-11-05 NOTE — ED Provider Notes (Signed)
Medical screening examination/treatment/procedure(s) were performed by non-physician practitioner and as supervising physician I was immediately available for consultation/collaboration.    Celene KrasJon R Paschal Blanton, MD 11/05/13 [phone]

## 2016-03-26 ENCOUNTER — Emergency Department (HOSPITAL_COMMUNITY)
Admission: EM | Admit: 2016-03-26 | Discharge: 2016-03-27 | Disposition: A | Discharge status: Home / Self Care | Payer: Self-pay | Attending: Emergency Medicine | Admitting: Emergency Medicine

## 2016-03-26 ENCOUNTER — Emergency Department (HOSPITAL_COMMUNITY): Payer: Self-pay

## 2016-03-26 ENCOUNTER — Encounter (HOSPITAL_COMMUNITY): Payer: Self-pay | Admitting: *Deleted

## 2016-03-26 DIAGNOSIS — J209 Acute bronchitis, unspecified: Secondary | ICD-10-CM | POA: Insufficient documentation

## 2016-03-26 DIAGNOSIS — J45909 Unspecified asthma, uncomplicated: Secondary | ICD-10-CM | POA: Insufficient documentation

## 2016-03-26 NOTE — ED Triage Notes (Signed)
The pt has had a cough and a cold for 3 days productive cough yellow thick  No temp  C/o aching all over her body also  lmp now

## 2016-03-27 NOTE — ED Notes (Signed)
Pt departed in NAD.  

## 2016-03-27 NOTE — ED Provider Notes (Signed)
[facility]PT Provider Note   CSN: [id number] Arrival date & time: 03/26/16  2212     History   Chief Complaint Chief Complaint  Patient presents with  . Cough    HPI MAGARET JUSTO is a 20 y.o. female.  The history is provided by the patient. No language interpreter was used.  Cough    ALLISEN PIDGEON is a 20 y.o. female who presents to the Emergency Department complaining of cough.  She reports 3 days of nasal congestion, laryngitis, cough. She has subjective fevers at home. She has multiple sick contacts with similar symptoms. She has been trying Mucinex at home with no significant change in her symptoms. No abdominal pain, nausea, vomiting, diarrhea. She was recently treated with a dose of steroids for eczema. Past Medical History:  Diagnosis Date  . Allergy   . Asthma   . Eczema   . Eczema   . Ovarian cyst    resolved    Patient Active Problem List   Diagnosis Date Noted  . OVARIAN CYST, RIGHT 06/30/2008  . ECZEMA, ATOPIC DERMATITIS 08/20/2006    Past Surgical History:  Procedure Laterality Date  . OVARIAN CYST REMOVAL      OB History    No data available       Home Medications    Prior to Admission medications   Medication Sig Start Date End Date Taking? Authorizing Provider  diphenhydrAMINE (BENADRYL) 25 MG tablet Take 1 tablet (25 mg total) by mouth every 6 (six) hours. 10/25/13   Felicie Morn, NP  famotidine (PEPCID) 20 MG tablet Take 1 tablet (20 mg total) by mouth 2 (two) times daily. 10/25/13   Felicie Morn, NP  ibuprofen (ADVIL,MOTRIN) 600 MG tablet Take 1 tablet (600 mg total) by mouth every 6 (six) hours as needed. 11/02/13   Marissa Sciacca, PA-C  predniSONE (DELTASONE) 10 MG tablet Take 2 tablets (20 mg total) by mouth daily. 10/25/13   Felicie Morn, NP    Family History Family History  Problem Relation Age of Onset  . Asthma Mother   . Bipolar disorder Mother   . Asthma Father   . Hypertension Father   . Bipolar disorder Father   .  Hypertension Paternal Grandmother   . Diabetes Paternal Grandmother   . Asthma Paternal Grandmother     Social History Social History  Substance Use Topics  . Smoking status: Never Smoker  . Smokeless tobacco: Never Used  . Alcohol use No     Allergies   Dairy aid [lactase] and Penicillins   Review of Systems Review of Systems  Respiratory: Positive for cough.   All other systems reviewed and are negative.    Physical Exam Updated Vital Signs BP 111/72 (BP Location: Right Arm)   Pulse 90   Temp 98.4 F (36.9 C) (Oral)   Resp 14   LMP 03/26/2016 Comment: reviewed BEFORE imaging  SpO2 100%   Physical Exam  Constitutional: She is oriented to person, place, and time. She appears well-developed and well-nourished.  HENT:  Head: Normocephalic and atraumatic.  Right Ear: External ear normal.  Left Ear: External ear normal.  Mouth/Throat: Oropharynx is clear and moist. No oropharyngeal exudate.  Neck: Neck supple.  Cardiovascular: Normal rate and regular rhythm.   No murmur heard. Pulmonary/Chest: Effort normal and breath sounds normal. No respiratory distress.  Abdominal: Soft. There is no tenderness. There is no rebound and no guarding.  Musculoskeletal: She exhibits no edema or tenderness.  Neurological: She is  alert and oriented to person, place, and time.  Skin: Skin is warm and dry.  Psychiatric: She has a normal mood and affect. Her behavior is normal.  Nursing note and vitals reviewed.    ED Treatments / Results  Labs (all labs ordered are listed, but only abnormal results are displayed) Labs Reviewed - No data to display  EKG  EKG Interpretation None       Radiology Dg Chest 2 View  Result Date: 03/27/2016 CLINICAL DATA:  Productive cough for 3 days. EXAM: CHEST  2 VIEW COMPARISON:  None. FINDINGS: Cardiomediastinal silhouette is normal. No pleural effusions or focal consolidations. Mild bronchitic changes. Trachea projects midline and there is  no pneumothorax. Soft tissue planes and included osseous structures are non-suspicious. IMPRESSION: Mild bronchitic changes without focal consolidation. Electronically Signed   By: Awilda Metroourtnay  Bloomer M.D.   On: 03/27/2016 01:16    Procedures Procedures (including critical care time)  Medications Ordered in ED Medications - No data to display   Initial Impression / Assessment and Plan / ED Course  I have reviewed the triage vital signs and the nursing notes.  Pertinent labs & imaging results that were available during my care of the patient were reviewed by me and considered in my medical decision making (see chart for details).  Clinical Course    Patient here for fever, cough. She is nontoxic appearing on examination with no respiratory distress. No evidence of acute bacterial infectious process. His custom care for viral bronchitis with fever control, antitussives over-the-counter, outpatient follow-up and return precautions.  Final Clinical Impressions(s) / ED Diagnoses   Final diagnoses:  Acute bronchitis, unspecified organism    New Prescriptions New Prescriptions   No medications on file     Tilden FossaElizabeth Syriah Delisi, MD 03/27/16 [phone]

## 2016-06-01 ENCOUNTER — Emergency Department (HOSPITAL_COMMUNITY)
Admission: EM | Admit: 2016-06-01 | Discharge: 2016-06-02 | Disposition: A | Discharge status: Other Acute Inpatient Hospital | Payer: Federal, State, Local not specified - Other | Attending: Emergency Medicine | Admitting: Emergency Medicine

## 2016-06-01 ENCOUNTER — Inpatient Hospital Stay (HOSPITAL_COMMUNITY)
Admission: AD | Admit: 2016-06-01 | Payer: Federal, State, Local not specified - Other | Source: Intra-hospital | Admitting: Psychiatry

## 2016-06-01 DIAGNOSIS — T50902A Poisoning by unspecified drugs, medicaments and biological substances, intentional self-harm, initial encounter: Secondary | ICD-10-CM

## 2016-06-01 DIAGNOSIS — T43022A Poisoning by tetracyclic antidepressants, intentional self-harm, initial encounter: Secondary | ICD-10-CM | POA: Insufficient documentation

## 2016-06-01 DIAGNOSIS — Z79899 Other long term (current) drug therapy: Secondary | ICD-10-CM | POA: Insufficient documentation

## 2016-06-01 DIAGNOSIS — F319 Bipolar disorder, unspecified: Secondary | ICD-10-CM | POA: Diagnosis present

## 2016-06-01 DIAGNOSIS — J45909 Unspecified asthma, uncomplicated: Secondary | ICD-10-CM | POA: Insufficient documentation

## 2016-06-01 DIAGNOSIS — T434X2A Poisoning by butyrophenone and thiothixene neuroleptics, intentional self-harm, initial encounter: Secondary | ICD-10-CM | POA: Insufficient documentation

## 2016-06-01 LAB — RAPID URINE DRUG SCREEN, HOSP PERFORMED
AMPHETAMINES: NOT DETECTED
Barbiturates: NOT DETECTED
Benzodiazepines: NOT DETECTED
Cocaine: NOT DETECTED
OPIATES: NOT DETECTED
Tetrahydrocannabinol: NOT DETECTED

## 2016-06-01 LAB — CBC
HCT: 40.5 % (ref 36.0–46.0)
Hemoglobin: 14 g/dL (ref 12.0–15.0)
MCH: 30.9 pg (ref 26.0–34.0)
MCHC: 34.6 g/dL (ref 30.0–36.0)
MCV: 89.4 fL (ref 78.0–100.0)
PLATELETS: 164 10*3/uL (ref 150–400)
RBC: 4.53 MIL/uL (ref 3.87–5.11)
RDW: 12 % (ref 11.5–15.5)
WBC: 7.4 10*3/uL (ref 4.0–10.5)

## 2016-06-01 LAB — CBG MONITORING, ED: GLUCOSE-CAPILLARY: 103 mg/dL — AB (ref 65–99)

## 2016-06-01 LAB — ACETAMINOPHEN LEVEL
Acetaminophen (Tylenol), Serum: <10 ug/mL — ABNORMAL LOW (ref 10–30)
Acetaminophen (Tylenol), Serum: <10 ug/mL — ABNORMAL LOW (ref 10–30)

## 2016-06-01 LAB — COMPREHENSIVE METABOLIC PANEL
ALT: 15 U/L (ref 14–54)
AST: 20 U/L (ref 15–41)
Albumin: 4.4 g/dL (ref 3.5–5.0)
Alkaline Phosphatase: 40 U/L (ref 38–126)
Anion gap: 9 (ref 5–15)
BILIRUBIN TOTAL: 0.4 mg/dL (ref 0.3–1.2)
BUN: 15 mg/dL (ref 6–20)
CHLORIDE: 105 mmol/L (ref 101–111)
CO2: 26 mmol/L (ref 22–32)
Calcium: 9.4 mg/dL (ref 8.9–10.3)
Creatinine, Ser: 0.81 mg/dL (ref 0.44–1.00)
GFR calc non Af Amer: >60 mL/min (ref >60)
Glucose, Bld: 102 mg/dL — ABNORMAL HIGH (ref 65–99)
POTASSIUM: 3.4 mmol/L — AB (ref 3.5–5.1)
Sodium: 140 mmol/L (ref 135–145)
TOTAL PROTEIN: 7.7 g/dL (ref 6.5–8.1)

## 2016-06-01 LAB — PREGNANCY, URINE: PREG TEST UR: NEGATIVE

## 2016-06-01 LAB — SALICYLATE LEVEL

## 2016-06-01 LAB — ETHANOL: Alcohol, Ethyl (B): <5 mg/dL (ref <5)

## 2016-06-01 MED ORDER — LORAZEPAM 1 MG PO TABS: 1.0000 mg | ORAL_TABLET | Freq: Three times a day (TID) | ORAL | Status: DC | PRN

## 2016-06-01 MED ORDER — HYDROXYZINE HCL 25 MG PO TABS
25.0000 mg | ORAL_TABLET | Freq: Every day | ORAL | Status: DC
  Filled 2016-06-01: qty 1

## 2016-06-01 MED ORDER — ONDANSETRON HCL 4 MG PO TABS: 4.0000 mg | ORAL_TABLET | Freq: Three times a day (TID) | ORAL | Status: DC | PRN

## 2016-06-01 MED ORDER — MIRTAZAPINE 30 MG PO TABS
30.0000 mg | ORAL_TABLET | Freq: Every day | ORAL | Status: DC
  Filled 2016-06-01: qty 1

## 2016-06-01 MED ORDER — NICOTINE 21 MG/24HR TD PT24: 21.0000 mg | MEDICATED_PATCH | Freq: Every day | TRANSDERMAL | Status: DC

## 2016-06-01 MED ORDER — HALOPERIDOL 2 MG PO TABS: 2.0000 mg | ORAL_TABLET | Freq: Every day | ORAL | Status: DC

## 2016-06-01 MED ORDER — IBUPROFEN 200 MG PO TABS: 600.0000 mg | ORAL_TABLET | Freq: Three times a day (TID) | ORAL | Status: DC | PRN

## 2016-06-01 MED ORDER — ZOLPIDEM TARTRATE 5 MG PO TABS: 5.0000 mg | ORAL_TABLET | Freq: Every evening | ORAL | Status: DC | PRN

## 2016-06-01 MED ORDER — ALUM & MAG HYDROXIDE-SIMETH 200-200-20 MG/5ML PO SUSP: 30.0000 mL | ORAL | Status: DC | PRN

## 2016-06-01 MED ORDER — SODIUM CHLORIDE 0.9 % IV BOLUS (SEPSIS): 1000.0000 mL | Freq: Once | INTRAVENOUS | Status: DC

## 2016-06-01 MED ORDER — SODIUM CHLORIDE 0.9 % IV BOLUS (SEPSIS)
1000.0000 mL | Freq: Once | INTRAVENOUS | Status: AC
  Administered 2016-06-01: 1000 mL via INTRAVENOUS

## 2016-06-01 NOTE — ED Notes (Addendum)
Poison Control called and stated to watch patient, if patient keeps falling asleep and not alert put back on heart monitor.  Will call back around 8 pm tonight.  Nonie Hoyerenise Mills, RN

## 2016-06-01 NOTE — [facility] Counselor (Signed)
Clinician met with Dr. Matilde Bash, RN (charge nurse); Raquel Sarna RN and it was recommended that the pt has an AM Psychiatric Evaluation. Pt refuses to sign voluntarily paperwork, per Dr. Canary Brim pt denies SI. Updated disposition discussed with [place], AC.   Edd Fabian, MS, [facility], [facility] Triage Specialist [phone]

## 2016-06-01 NOTE — ED Provider Notes (Signed)
[facility]-EMERGENCY DEPT Provider Note   CSN: [id number] Arrival date & time: 06/01/16  0410     History   Chief Complaint Chief Complaint  Patient presents with  . Ingestion    HPI Deanna [name] is a 20 y.o. female.  The history is provided by the patient.  Ingestion  This is a new problem. Episode onset: unknown time ago. The problem occurs constantly. The problem has not changed since onset.Pertinent negatives include no chest pain and no abdominal pain. Nothing aggravates the symptoms. Nothing relieves the symptoms.  Patient presents with intentional overdose She reports she just "trying to sleep" due to multiple arguments with family She reports taking an unknown quantity of haldol and remeron She is unsure what time she took the medications  She reports vomiting She denies any pain complaints She denies any alcohol or drug use  Per nursing report, prior to arrival there were reports she expressed desire to harm herself  Past Medical History:  Diagnosis Date  . Allergy   . Asthma   . Eczema   . Eczema   . Ovarian cyst    resolved    Patient Active Problem List   Diagnosis Date Noted  . OVARIAN CYST, RIGHT 06/30/2008  . ECZEMA, ATOPIC DERMATITIS 08/20/2006    Past Surgical History:  Procedure Laterality Date  . OVARIAN CYST REMOVAL      OB History    No data available       Home Medications    Prior to Admission medications   Medication Sig Start Date End Date Taking? Authorizing Provider  haloperidol (HALDOL) 2 MG tablet Take 2 mg by mouth daily.   Yes Historical Provider, MD  hydrOXYzine (ATARAX/VISTARIL) 25 MG tablet Take 25 mg by mouth at bedtime. 03/21/16  Yes Historical Provider, MD  mirtazapine (REMERON) 30 MG tablet Take 30 mg by mouth at bedtime.   Yes Historical Provider, MD  diphenhydrAMINE (BENADRYL) 25 MG tablet Take 1 tablet (25 mg total) by mouth every 6 (six) hours. Patient not taking: Reported on 06/01/2016 10/25/13   Felicie Mornavid Smith, NP   famotidine (PEPCID) 20 MG tablet Take 1 tablet (20 mg total) by mouth 2 (two) times daily. Patient not taking: Reported on 06/01/2016 10/25/13   Felicie Mornavid Smith, NP  ibuprofen (ADVIL,MOTRIN) 600 MG tablet Take 1 tablet (600 mg total) by mouth every 6 (six) hours as needed. Patient not taking: Reported on 06/01/2016 11/02/13   Marissa Sciacca, PA-C  predniSONE (DELTASONE) 10 MG tablet Take 2 tablets (20 mg total) by mouth daily. Patient not taking: Reported on 06/01/2016 10/25/13   Felicie Mornavid Smith, NP    Family History Family History  Problem Relation Age of Onset  . Asthma Mother   . Bipolar disorder Mother   . Asthma Father   . Hypertension Father   . Bipolar disorder Father   . Hypertension Paternal Grandmother   . Diabetes Paternal Grandmother   . Asthma Paternal Grandmother     Social History Social History  Substance Use Topics  . Smoking status: Never Smoker  . Smokeless tobacco: Never Used  . Alcohol use No     Allergies   Dairy aid [lactase] and Penicillins   Review of Systems Review of Systems  Constitutional: Negative for fever.  Cardiovascular: Negative for chest pain.  Gastrointestinal: Positive for vomiting. Negative for abdominal pain.  All other systems reviewed and are negative.    Physical Exam Updated Vital Signs BP 91/67   Pulse 75   Resp  22   SpO2 99%   Physical Exam CONSTITUTIONAL: sleeping but easily arousable HEAD: Normocephalic/atraumatic EYES: EOMI/PERRL, no nystagmus ENMT: Mucous membranes moist NECK: supple no meningeal signs SPINE/BACK:entire spine nontender CV: S1/S2 noted, no murmurs/rubs/gallops noted LUNGS: Lungs are clear to auscultation bilaterally, no apparent distress ABDOMEN: soft, nontender, no rebound or guarding, bowel sounds noted throughout abdomen NEURO: Pt is sleeping but arousable, appropriate, answers all questions appropriately, moves all extremitiesx4.  No facial droop.   EXTREMITIES: pulses normal/equal, full  ROM SKIN: warm, color normal PSYCH: no abnormalities of mood noted, alert and oriented to situation   ED Treatments / Results  Labs (all labs ordered are listed, but only abnormal results are displayed) Labs Reviewed  COMPREHENSIVE METABOLIC PANEL - Abnormal; Notable for the following:       Result Value   Potassium 3.4 (*)    Glucose, Bld 102 (*)    All other components within normal limits  ACETAMINOPHEN LEVEL - Abnormal; Notable for the following:    Acetaminophen (Tylenol), Serum <10 (*)    All other components within normal limits  CBG MONITORING, ED - Abnormal; Notable for the following:    Glucose-Capillary 103 (*)    All other components within normal limits  ETHANOL  SALICYLATE LEVEL  CBC  RAPID URINE DRUG SCREEN, HOSP PERFORMED  ACETAMINOPHEN LEVEL  PREGNANCY, URINE  POC URINE PREG, ED    EKG  EKG Interpretation  Date/Time:  Sunday June 01 2016 04:22:28 EST Ventricular Rate:  77 PR Interval:    QRS Duration: 64 QT Interval:  377 QTC Calculation: 427 R Axis:   50 Text Interpretation:  Sinus rhythm Borderline T abnormalities, anterior leads ?u waves No previous ECGs available Confirmed by Bebe ShaggyWICKLINE  MD, Duron Meister ([id number]) on 06/01/2016 4:49:42 AM       Radiology No results found.  Procedures Procedures (including critical care time)  Medications Ordered in ED Medications  sodium chloride 0.9 % bolus 1,000 mL (1,000 mLs Intravenous Refused 06/01/16 0530)  sodium chloride 0.9 % bolus 1,000 mL (0 mLs Intravenous Stopped 06/01/16 0559)     Initial Impression / Assessment and Plan / ED Course  I have reviewed the triage vital signs and the nursing notes.  Pertinent labs & imaging results that were available during my care of the patient were reviewed by me and considered in my medical decision making (see chart for details).  Clinical Course     5:26 AM  Pt unsure when she took meds, but apparently it had been reported she took the meds around  3am Poison center recommends OBS for 6 hrs in the ED 5:50 AM Pt resting comfortably and easily arousable Continue to monitor BP 117/68   Pulse 75   Temp 98.6 F (37 C) (Oral)   Resp 22   SpO2 98%  7:17 AM Pt resting comfortably She is easily arousable Vitals appropriate BP 101/63 (BP Location: Left Arm)   Pulse 72   Temp 98.6 F (37 C) (Oral)   Resp 20   SpO2 97%  She only received 300ml IVNS prior to IV infiltrating However, pt refuses IV and is currently stable Will continue to monitor At signout to dr isaacs, f/u on repeat APAP.  She will be medically cleared at 9am   Final Clinical Impressions(s) / ED Diagnoses   Final diagnoses:  Intentional drug overdose, initial encounter [facility](HCC)    New Prescriptions New Prescriptions   No medications on file     Zadie Rhineonald Koa Zoeller, MD 06/01/16 [phone]

## 2016-06-01 NOTE — ED Notes (Addendum)
Poison control notified of ingestion of Haldol and Remeron at appx 0300 this morning. Recommendations are EKG, 4hr Tylenol level post ingestion, benzodiazepines as needed, and 6hr observation time on cardiac monitoring. MD notified of recommendations.

## 2016-06-01 NOTE — ED Notes (Signed)
Pt refused to speak to TTS
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
## 2016-06-01 NOTE — BH Assessment (Signed)
[facility] Assessment Progress Note    Case was staffed with Link SnufferHobson NP who recommended patient be admitted inpatient once medically cleared.

## 2016-06-01 NOTE — ED Notes (Signed)
Patient knows we need urine but says she is unable to urinate at this time

## 2016-06-01 NOTE — ED Notes (Signed)
Bed: RESA Expected date:  Expected time:  Means of arrival:  Comments: 20 yo F/OD

## 2016-06-01 NOTE — ED Notes (Signed)
Patient ate dinner meal and went back to sleep

## 2016-06-01 NOTE — [facility] Counselor (Signed)
Clinician attempted to complete pt's support paperwork however, pt refused. Pt wanted to know why she needed impatient treatment. Clinician explained the process, if the recommendation if inpatient treatment and she refuses to sign in voluntarily then she may be involuntarily committed. Pt reported, she has to work tomorrow, wanted to make a phone call and to speak to her nurse. Clinician discussed the updated information to pt's Verlon AuLeslie, RN and Camelia Engerri, Charity fundraiserN (charge nurse).  Gwinda Passereylese D Bennett, MS, [facility], [facility] Triage Specialist [phone]

## 2016-06-01 NOTE — ED Notes (Signed)
Offered bathroom to patient again and she refused to use the bathroom

## 2016-06-01 NOTE — ED Notes (Cosign Needed)
Received sign out from Dr. Bebe ShaggyWickline.  Pt here with intentional ingestion of Haldol and Remeron "just trying to sleep" after multiple arguments with family.  Unknown quantity or time of ingestion. Labs are unremarkable.  She denies active SI/HI.  She is now medically cleared. Will consult TTS for further management of psychiatric illness.  Results for orders placed or performed during the hospital encounter of 06/01/16  Comprehensive metabolic panel  Result Value Ref Range   Sodium 140 135 - 145 mmol/L   Potassium 3.4 (L) 3.5 - 5.1 mmol/L   Chloride 105 101 - 111 mmol/L   CO2 26 22 - 32 mmol/L   Glucose, Bld 102 (H) 65 - 99 mg/dL   BUN 15 6 - 20 mg/dL   Creatinine, Ser 1.610.81 0.44 - 1.00 mg/dL   Calcium 9.4 8.9 - 09.610.3 mg/dL   Total Protein 7.7 6.5 - 8.1 g/dL   Albumin 4.4 3.5 - 5.0 g/dL   AST 20 15 - 41 U/L   ALT 15 14 - 54 U/L   Alkaline Phosphatase 40 38 - 126 U/L   Total Bilirubin 0.4 0.3 - 1.2 mg/dL   GFR calc non Af Amer >60 >60 mL/min   GFR calc Af Amer >60 >60 mL/min   Anion gap 9 5 - 15  Ethanol  Result Value Ref Range   Alcohol, Ethyl (B) <5 <5 mg/dL  Salicylate level  Result Value Ref Range   Salicylate Lvl <7.0 2.8 - 30.0 mg/dL  Acetaminophen level  Result Value Ref Range   Acetaminophen (Tylenol), Serum <10 (L) 10 - 30 ug/mL  cbc  Result Value Ref Range   WBC 7.4 4.0 - 10.5 K/uL   RBC 4.53 3.87 - 5.11 MIL/uL   Hemoglobin 14.0 12.0 - 15.0 g/dL   HCT 04.540.5 40.936.0 - 81.146.0 %   MCV 89.4 78.0 - 100.0 fL   MCH 30.9 26.0 - 34.0 pg   MCHC 34.6 30.0 - 36.0 g/dL   RDW 91.412.0 78.211.5 - 95.615.5 %   Platelets 164 150 - 400 K/uL  Rapid urine drug screen (hospital performed)  Result Value Ref Range   Opiates NONE DETECTED NONE DETECTED   Cocaine NONE DETECTED NONE DETECTED   Benzodiazepines NONE DETECTED NONE DETECTED   Amphetamines NONE DETECTED NONE DETECTED   Tetrahydrocannabinol NONE DETECTED NONE DETECTED   Barbiturates NONE DETECTED NONE DETECTED  Acetaminophen level  Result  Value Ref Range   Acetaminophen (Tylenol), Serum <10 (L) 10 - 30 ug/mL  Pregnancy, urine  Result Value Ref Range   Preg Test, Ur NEGATIVE NEGATIVE  CBG monitoring, ED  Result Value Ref Range   Glucose-Capillary 103 (H) 65 - 99 mg/dL   No results found.    Fayrene HelperBowie Ivette Castronova, PA-C 06/01/16 [phone]

## 2016-06-01 NOTE — ED Notes (Addendum)
Pt declined another IV at this time. Dr. Bebe ShaggyWickline informed

## 2016-06-01 NOTE — ED Notes (Addendum)
Per EMS- Pt took approximately 50 pills of haldol and remeron. Pt took pills because she was angry, but not intent to harm. [employer] stated that pt got into argument with girlfriend recently and made statements about hurting herself. Alert and oriented currently, drowsy but easily arousable.

## 2016-06-01 NOTE — ED Notes (Signed)
Lab claims dark green tube was never received ( for repeat aceta level).   New dark green sent to lab.

## 2016-06-02 ENCOUNTER — Inpatient Hospital Stay (HOSPITAL_COMMUNITY)
Admission: AD | Admit: 2016-06-02 | Discharge: 2016-06-05 | DRG: 885 | Disposition: A | Discharge status: Home / Self Care | Payer: Federal, State, Local not specified - Other | Source: Intra-hospital | Attending: Psychiatry | Admitting: Psychiatry

## 2016-06-02 ENCOUNTER — Encounter (HOSPITAL_COMMUNITY): Payer: Self-pay | Admitting: *Deleted

## 2016-06-02 DIAGNOSIS — Z8249 Family history of ischemic heart disease and other diseases of the circulatory system: Secondary | ICD-10-CM

## 2016-06-02 DIAGNOSIS — F411 Generalized anxiety disorder: Secondary | ICD-10-CM | POA: Diagnosis present

## 2016-06-02 DIAGNOSIS — F319 Bipolar disorder, unspecified: Principal | ICD-10-CM | POA: Diagnosis present

## 2016-06-02 DIAGNOSIS — Z818 Family history of other mental and behavioral disorders: Secondary | ICD-10-CM

## 2016-06-02 DIAGNOSIS — F17200 Nicotine dependence, unspecified, uncomplicated: Secondary | ICD-10-CM | POA: Diagnosis present

## 2016-06-02 DIAGNOSIS — F3130 Bipolar disorder, current episode depressed, mild or moderate severity, unspecified: Secondary | ICD-10-CM | POA: Diagnosis present

## 2016-06-02 DIAGNOSIS — Z915 Personal history of self-harm: Secondary | ICD-10-CM

## 2016-06-02 DIAGNOSIS — Z79899 Other long term (current) drug therapy: Secondary | ICD-10-CM

## 2016-06-02 DIAGNOSIS — Z825 Family history of asthma and other chronic lower respiratory diseases: Secondary | ICD-10-CM | POA: Diagnosis not present

## 2016-06-02 DIAGNOSIS — Z880 Allergy status to penicillin: Secondary | ICD-10-CM

## 2016-06-02 DIAGNOSIS — T50901A Poisoning by unspecified drugs, medicaments and biological substances, accidental (unintentional), initial encounter: Secondary | ICD-10-CM

## 2016-06-02 DIAGNOSIS — Z91011 Allergy to milk products: Secondary | ICD-10-CM

## 2016-06-02 DIAGNOSIS — Z833 Family history of diabetes mellitus: Secondary | ICD-10-CM

## 2016-06-02 DIAGNOSIS — F259 Schizoaffective disorder, unspecified: Secondary | ICD-10-CM | POA: Diagnosis present

## 2016-06-02 HISTORY — DX: Major depressive disorder, single episode, unspecified: F32.9

## 2016-06-02 HISTORY — DX: Depression, unspecified: F32.A

## 2016-06-02 HISTORY — DX: Anxiety disorder, unspecified: F41.9

## 2016-06-02 MED ORDER — HYDROXYZINE HCL 25 MG PO TABS
25.0000 mg | ORAL_TABLET | Freq: Every day | ORAL | Status: DC
Start: 2016-06-02 — End: 2016-06-04
  Administered 2016-06-02 – 2016-06-03 (×2): 25 mg via ORAL
  Filled 2016-06-02 (×5): qty 1

## 2016-06-02 MED ORDER — ONDANSETRON HCL 4 MG PO TABS: 4.0000 mg | ORAL_TABLET | Freq: Three times a day (TID) | ORAL | Status: DC | PRN

## 2016-06-02 MED ORDER — CARBAMAZEPINE 200 MG PO TABS: 200.0000 mg | ORAL_TABLET | Freq: Two times a day (BID) | ORAL | Status: DC

## 2016-06-02 MED ORDER — NICOTINE 21 MG/24HR TD PT24
21.0000 mg | MEDICATED_PATCH | TRANSDERMAL | Status: DC
  Administered 2016-06-02: 21 mg via TRANSDERMAL
  Filled 2016-06-02 (×3): qty 1

## 2016-06-02 MED ORDER — NICOTINE 21 MG/24HR TD PT24
21.0000 mg | MEDICATED_PATCH | Freq: Every day | TRANSDERMAL | Status: DC
  Filled 2016-06-02: qty 1

## 2016-06-02 MED ORDER — MAGNESIUM HYDROXIDE 400 MG/5ML PO SUSP: 30.0000 mL | Freq: Every day | ORAL | Status: DC | PRN

## 2016-06-02 MED ORDER — IBUPROFEN 600 MG PO TABS: 600.0000 mg | ORAL_TABLET | Freq: Three times a day (TID) | ORAL | Status: DC | PRN

## 2016-06-02 MED ORDER — ALUM & MAG HYDROXIDE-SIMETH 200-200-20 MG/5ML PO SUSP: 30.0000 mL | ORAL | Status: DC | PRN

## 2016-06-02 MED ORDER — ACETAMINOPHEN 325 MG PO TABS: 650.0000 mg | ORAL_TABLET | Freq: Four times a day (QID) | ORAL | Status: DC | PRN

## 2016-06-02 MED ORDER — CARBAMAZEPINE 200 MG PO TABS
200.0000 mg | ORAL_TABLET | Freq: Two times a day (BID) | ORAL | Status: DC
  Administered 2016-06-02 – 2016-06-03 (×2): 200 mg via ORAL
  Filled 2016-06-02 (×5): qty 1

## 2016-06-02 NOTE — Progress Notes (Signed)
06/02/16 1359:  LRT went to pt room to offer activities, pt was sleep.  Caroll RancherMarjette Luci Bellucci, LRT/CTRS

## 2016-06-02 NOTE — Tx Team (Signed)
Initial Treatment Plan 06/02/2016 5:55 PM Deanna FlemingsNykeshia D Brase FAO:[id number]    PATIENT STRESSORS: Financial difficulties Marital or family conflict Medication change or noncompliance Occupational concerns   PATIENT STRENGTHS: Ability for insight Active sense of humor Average or above average intelligence Capable of independent living MetallurgistCommunication skills Financial means General fund of knowledge Motivation for treatment/growth Physical Health Supportive family/friends Work skills   PATIENT IDENTIFIED PROBLEMS: "depression"  "anxiety"  "panic"  "suicide thoughts"               DISCHARGE CRITERIA:  Ability to meet basic life and health needs Improved stabilization in mood, thinking, and/or behavior Medical problems require only outpatient monitoring Motivation to continue treatment in a less acute level of care Need for constant or close observation no longer present Reduction of life-threatening or endangering symptoms to within safe limits Safe-care adequate arrangements made Verbal commitment to aftercare and medication compliance  PRELIMINARY DISCHARGE PLAN: Attend aftercare/continuing care group Attend PHP/IOP Outpatient therapy Return to previous living arrangement Return to previous work or school arrangements  PATIENT/FAMILY INVOLVEMENT: This treatment plan has been presented to and reviewed with the patient, Deanna Flemingsykeshia D [name].  The patient and family have been given the opportunity to ask questions and make suggestions.  Quintella ReichertKnight, Thelia Tanksley Salt LickShephard, RN 06/02/2016, 5:55 PM

## 2016-06-02 NOTE — Plan of Care (Signed)
Problem: Safety: Goal: Periods of time without injury will increase Outcome: Progressing Patient remains safe on the unit at this time. Patient able to verbalize needs to staff. Patient on q15 min safety checks. Patient is a low fall risk. VSS.

## 2016-06-02 NOTE — Progress Notes (Signed)
Nursing Progress Note 7p-7a  D) Patient presents with flat affect and appears depressed. Patient is isolating in her bed and states "I am tired". Patient is minimal during interaction but cooperative with medication administration. Patient denies SI/HI/AVH and pain. Patient contracts for safety at this time.   A) Emotional support given. Patient on q15 min safety checks. Opportunities for questions or concerns presented to patient. Patient encouraged to identify a new goal for tomorrow.   R) Patient receptive to interaction with nurse. Patient remains safe on the unit at this time. Patient is resting in bed without complaints. Will continue to monitor.

## 2016-06-02 NOTE — BH Assessment (Signed)
[facility] Assessment Progress Note  Per Thedore MinsMojeed Akintayo, MD, this pt requires psychiatric hospitalization at this time.  Deanna Heinrichina Tate, RN, [id number].  Pt has signed Voluntary Admission and Consent for Treatment, as well as Consent to Release Information to no one, and signed forms [facility].  Pt's nurse, Diane, has been notified, and agrees to send original paperwork along with pt via [facility] [place], and to call report to [phone].  Deanna Canninghomas Kitai Purdom, MA Triage Specialist [phone]

## 2016-06-02 NOTE — Consult Note (Signed)
[facility] Consult   Reason for Consult:  Psychiatric evaluation Referring Physician:  EDP Patient Identification: Deanna [name] MRN:  [id number] Principal Diagnosis: Bipolar 1 disorder [facility]) Diagnosis:   Patient Active Problem List   Diagnosis Date Noted  . Bipolar 1 disorder (Luis Lopez) [F31.9] 06/02/2016    Priority: High  . OVARIAN CYST, RIGHT [N83.209] 06/30/2008  . ECZEMA, ATOPIC DERMATITIS [L20.89] 08/20/2006    Total Time spent with patient: 45 minutes  Subjective:   Deanna [name] is a 20 y.o. female patient admitted with Haldol overdose.  HPI: Patient who reports history of Bipolar disorder, she states that she took a bunch of Haldol after an argument with her girlfriend. Per [employer] reports, patient reportedly made a  statements about hurting herself. Patient refused to elaborate on the circumstances that lead to medication overdose. She denies drugs and alcohol abuse.  Past Psychiatric History: Bipolar disorder  Risk to Self: Suicidal Ideation: No Suicidal Intent: No Is patient at risk for suicide?: No (per patient) Suicidal Plan?: No Access to Means: No What has been your use of drugs/alcohol within the last 12 months?: denies How many times?: 0 Other Self Harm Risks: na Triggers for Past Attempts: Unknown Intentional Self Injurious Behavior: None Risk to Others: Homicidal Ideation: No Thoughts of Harm to Others: No Current Homicidal Intent: No Current Homicidal Plan: No Access to Homicidal Means: No Identified Victim:  (na) History of harm to others?: No Assessment of Violence: None Noted Violent Behavior Description:  (na) Does patient have access to weapons?: No Criminal Charges Pending?: No Does patient have a court date: No Prior Inpatient Therapy: Prior Inpatient Therapy: No Prior Therapy Dates: na Prior Therapy Facilty/Provider(s): na Reason for Treatment: na Prior Outpatient Therapy: Prior Outpatient Therapy: Yes Prior Therapy Dates:  2017 Prior Therapy Facilty/Provider(s): [facility] Reason for Treatment: MH issues Does patient have an ACCT team?: No Does patient have Intensive In-House Services?  : No Does patient have [facility] services? : Yes Does patient have P4[facility] services?: No  Past Medical History:  Past Medical History:  Diagnosis Date  . Allergy   . Asthma   . Eczema   . Eczema   . Ovarian cyst    resolved    Past Surgical History:  Procedure Laterality Date  . OVARIAN CYST REMOVAL     Family History:  Family History  Problem Relation Age of Onset  . Asthma Mother   . Bipolar disorder Mother   . Asthma Father   . Hypertension Father   . Bipolar disorder Father   . Hypertension Paternal Grandmother   . Diabetes Paternal Grandmother   . Asthma Paternal Grandmother    Family Psychiatric  History:  Social History:  History  Alcohol Use No     History  Drug Use No    Social History   Social History  . Marital status: Single    Spouse name: N/A  . Number of children: N/A  . Years of education: N/A   Social History Main Topics  . Smoking status: Never Smoker  . Smokeless tobacco: Never Used  . Alcohol use No  . Drug use: No  . Sexual activity: Not Currently    Partners: Female   Other Topics Concern  . Not on file   Social History Narrative  . No narrative on file   Additional Social History:    Allergies:   Allergies  Allergen Reactions  . D[employer] [place]       .  Penicillins Rash    Labs:  Results for orders placed or performed during the hospital encounter of 06/01/16 (from the past 48 hour(s))  Comprehensive metabolic panel     Status: Abnormal   Collection Time: 06/01/16  4:27 AM  Result Value Ref Range   Sodium 140 135 - 145 mmol/L   Potassium 3.4 (L) 3.5 - 5.1 mmol/L   Chloride 105 101 - 111 mmol/L   CO2 26 22 - 32 mmol/L   Glucose, Bld 102 (H) 65 - 99 mg/dL   BUN 15 6 - 20 mg/dL   Creatinine, Ser 0.81 0.44 - 1.00 mg/dL   Calcium 9.4 8.9 - 10.3  mg/dL   Total Protein 7.7 6.5 - 8.1 g/dL   Albumin 4.4 3.5 - 5.0 g/dL   AST 20 15 - 41 U/L   ALT 15 14 - 54 U/L   Alkaline Phosphatase 40 38 - 126 U/L   Total Bilirubin 0.4 0.3 - 1.2 mg/dL   GFR calc non Af Amer >60 >60 mL/min   GFR calc Af Amer >60 >60 mL/min    Comment: (NOTE) The eGFR has been calculated using the CKD EPI equation. This calculation has not been validated in all clinical situations. eGFR's persistently <60 mL/min signify possible Chronic Kidney Disease.    Anion gap 9 5 - 15  Ethanol     Status: None   Collection Time: 06/01/16  4:27 AM  Result Value Ref Range   Alcohol, Ethyl (B) <5 <5 mg/dL    Comment:        LOWEST DETECTABLE LIMIT FOR SERUM ALCOHOL IS 5 mg/dL FOR MEDICAL PURPOSES ONLY   Salicylate level     Status: None   Collection Time: 06/01/16  4:27 AM  Result Value Ref Range   Salicylate Lvl <4.9 2.8 - 30.0 mg/dL  Acetaminophen level     Status: Abnormal   Collection Time: 06/01/16  4:27 AM  Result Value Ref Range   Acetaminophen (Tylenol), Serum <10 (L) 10 - 30 ug/mL    Comment:        THERAPEUTIC CONCENTRATIONS VARY SIGNIFICANTLY. A RANGE OF 10-30 ug/mL MAY BE AN EFFECTIVE CONCENTRATION FOR MANY PATIENTS. HOWEVER, SOME ARE BEST TREATED AT CONCENTRATIONS OUTSIDE THIS RANGE. ACETAMINOPHEN CONCENTRATIONS >150 ug/mL AT 4 HOURS AFTER INGESTION AND >50 ug/mL AT 12 HOURS AFTER INGESTION ARE OFTEN ASSOCIATED WITH TOXIC REACTIONS.   cbc     Status: None   Collection Time: 06/01/16  4:27 AM  Result Value Ref Range   WBC 7.4 4.0 - 10.5 K/uL   RBC 4.53 3.87 - 5.11 MIL/uL   Hemoglobin 14.0 12.0 - 15.0 g/dL   HCT 40.5 36.0 - 46.0 %   MCV 89.4 78.0 - 100.0 fL   MCH 30.9 26.0 - 34.0 pg   MCHC 34.6 30.0 - 36.0 g/dL   RDW 12.0 11.5 - 15.5 %   Platelets 164 150 - 400 K/uL  CBG monitoring, ED     Status: Abnormal   Collection Time: 06/01/16  4:30 AM  Result Value Ref Range   Glucose-Capillary 103 (H) 65 - 99 mg/dL  Rapid urine drug screen  (hospital performed)     Status: None   Collection Time: 06/01/16  5:42 AM  Result Value Ref Range   Opiates NONE DETECTED NONE DETECTED   Cocaine NONE DETECTED NONE DETECTED   Benzodiazepines NONE DETECTED NONE DETECTED   Amphetamines NONE DETECTED NONE DETECTED   Tetrahydrocannabinol NONE DETECTED NONE DETECTED   Barbiturates NONE  DETECTED NONE DETECTED    Comment:        DRUG SCREEN FOR MEDICAL PURPOSES ONLY.  IF CONFIRMATION IS NEEDED FOR ANY PURPOSE, NOTIFY LAB WITHIN 5 DAYS.        LOWEST DETECTABLE LIMITS FOR URINE DRUG SCREEN Drug Class       Cutoff (ng/mL) Amphetamine      1000 Barbiturate      200 Benzodiazepine   728 Tricyclics       206 Opiates          300 Cocaine          300 THC              50   Pregnancy, urine     Status: None   Collection Time: 06/01/16  5:42 AM  Result Value Ref Range   Preg Test, Ur NEGATIVE NEGATIVE    Comment:        THE SENSITIVITY OF THIS METHODOLOGY IS >20 mIU/mL.   Acetaminophen level     Status: Abnormal   Collection Time: 06/01/16  7:40 AM  Result Value Ref Range   Acetaminophen (Tylenol), Serum <10 (L) 10 - 30 ug/mL    Comment:        THERAPEUTIC CONCENTRATIONS VARY SIGNIFICANTLY. A RANGE OF 10-30 ug/mL MAY BE AN EFFECTIVE CONCENTRATION FOR MANY PATIENTS. HOWEVER, SOME ARE BEST TREATED AT CONCENTRATIONS OUTSIDE THIS RANGE. ACETAMINOPHEN CONCENTRATIONS >150 ug/mL AT 4 HOURS AFTER INGESTION AND >50 ug/mL AT 12 HOURS AFTER INGESTION ARE OFTEN ASSOCIATED WITH TOXIC REACTIONS.     Current Facility-Administered Medications  Medication Dose Route Frequency Provider Last Rate Last Dose  . alum & mag hydroxide-simeth (MAALOX/MYLANTA) 200-200-20 MG/5ML suspension 30 mL  30 mL Oral PRN Domenic Moras, PA-C      . carbamazepine (TEGRETOL) tablet 200 mg  200 mg Oral BID PC Carlota Philley, MD      . hydrOXYzine (ATARAX/VISTARIL) tablet 25 mg  25 mg Oral QHS Domenic Moras, PA-C      . ibuprofen (ADVIL,MOTRIN) tablet 600 mg  600 mg  Oral Q8H PRN Domenic Moras, PA-C      . nicotine (NICODERM CQ - dosed in mg/24 hours) patch 21 mg  21 mg Transdermal Daily Domenic Moras, PA-C      . ondansetron [facility]) tablet 4 mg  4 mg Oral Q8H PRN Domenic Moras, PA-C      . sodium chloride 0.9 % bolus 1,000 mL  1,000 mL Intravenous Once Ripley Fraise, MD       Current Outpatient Prescriptions  Medication Sig Dispense Refill  . haloperidol (HALDOL) 2 MG tablet Take 2 mg by mouth daily.    . hydrOXYzine (ATARAX/VISTARIL) 25 MG tablet Take 25 mg by mouth at bedtime.  0  . mirtazapine (REMERON) 30 MG tablet Take 30 mg by mouth at bedtime.    . diphenhydrAMINE (BENADRYL) 25 MG tablet Take 1 tablet (25 mg total) by mouth every 6 (six) hours. (Patient not taking: Reported on 06/01/2016) 20 tablet 0  . famotidine (PEPCID) 20 MG tablet Take 1 tablet (20 mg total) by mouth 2 (two) times daily. (Patient not taking: Reported on 06/01/2016) 20 tablet 0  . ibuprofen (ADVIL,MOTRIN) 600 MG tablet Take 1 tablet (600 mg total) by mouth every 6 (six) hours as needed. (Patient not taking: Reported on 06/01/2016) 30 tablet 0  . predniSONE (DELTASONE) 10 MG tablet Take 2 tablets (20 mg total) by mouth daily. (Patient not taking: Reported on 06/01/2016) 14 tablet 0  Musculoskeletal: Strength & Muscle Tone: within normal limits Gait & Station: normal Patient leans: N/A  Psychiatric Specialty Exam: Physical Exam  Psychiatric: Her speech is normal. Her affect is angry. She is agitated and combative. Cognition and memory are normal. She expresses impulsivity. She expresses suicidal ideation.    Review of Systems  Constitutional: Negative.   HENT: Negative.   Eyes: Negative.   Respiratory: Negative.   Cardiovascular: Negative.   Gastrointestinal: Negative.   Genitourinary: Negative.   Musculoskeletal: Negative.   Skin: Negative.   Neurological: Negative.   Endo/Heme/Allergies: Negative.   Psychiatric/Behavioral: Positive for suicidal ideas.    Blood  pressure 106/58, pulse 77, temperature 98.2 F (36.8 C), temperature source Oral, resp. rate 19, height _0  (1.6 m), weight 68 kg (150 lb), SpO2 95 %.Body mass index is 26.57 kg/m.  General Appearance: Casual  Eye Contact:  Minimal  Speech:  Clear and Coherent  Volume:  Increased  Mood:  Angry and Irritable  Affect:  Labile  Thought Process:  Coherent and Descriptions of Associations: Intact  Orientation:  Full (Time, Place, and Person)  Thought Content:  Logical  Suicidal Thoughts:  patient refused to answer  Homicidal Thoughts:  No  Memory:  Immediate;   Fair Recent;   Fair Remote;   Fair  Judgement:  Poor  Insight:  Shallow  Psychomotor Activity:  Increased  Concentration:  Concentration: Fair and Attention Span: Fair  Recall:  [employer] of Knowledge:  Good  Language:  Good  Akathisia:  No  Handed:  Right  AIMS (if indicated):     Assets:  Communication Skills Desire for Improvement  ADL's:  Intact  Cognition:  WNL  Sleep:   fair     Treatment Plan Summary: Daily contact with patient to assess and evaluate symptoms and progress in treatment and Medication management  Start Tegretol 200 mg twice daily for Bipolar disorder  Disposition: Recommend psychiatric Inpatient admission when medically cleared. Supportive therapy provided about ongoing stressors.  Corena Pilgrim, MD 06/02/2016 11:10 AM

## 2016-06-02 NOTE — ED Notes (Signed)
Introduced self to patient. Pt oriented to unit expectations.  Assessed pt for:  A) Anxiety &/or agitation: Pt does not appear to be anxious and does not report anxiety. She is calm and cooperative and is very sleepy. She is able to be alert and appropriate.   S) Safety: Safety maintained with q-15-minute checks and hourly rounds by staff.  A) ADLs: Pt able to perform ADLs independently.  P) Pick-Up (room cleanliness): Pt's room clean and free of clutter.

## 2016-06-02 NOTE — ED Notes (Signed)
Pt transported to [facility] by [facility]. All belongings returned to pt who signed for same.  

## 2016-06-02 NOTE — Progress Notes (Signed)
20 yr old female, voluntary patient, [facility]  History bipolar.  [facility]  Note in chart that patient is on probation but patient continued to say there were no problems.  That she took a few pills to help her sleep after she argued with her girlfriend.  Note in chart staetd she OD on 50 pills haldol and remeron.  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  High school graduate.  Never married, no children.  No surgeries.  [facility], dermatologist, [address] [place], [place] sees [place] for asthma/eczema.  Denied physical, sexual, verbal abuse.  Tattoos on upper back, L leg/knee, R thigh, upper chest, R lower abdomen, R arm upper and lower, L ring finger.   Denied hearing, dental, vision problems.  Smokes black mild daily.  Denied heroin, cocaine, THC, no drug or alcohol use.   Fall risk information reviewed and given to patient, low fall risk.  [employer] 22 has cell phone, shoe strings, blue jacket, blue pants. Patient has been cooperative and pleasant.  Oriented to unit and given food/drink.

## 2016-06-02 NOTE — Plan of Care (Signed)
Problem: Education: Goal: Ability to make informed decisions regarding treatment will improve Outcome: Progressing Nurse discussed suicidal thoughts/depression/anxiety/coping skills with patient.    

## 2016-06-03 DIAGNOSIS — T43022A Poisoning by tetracyclic antidepressants, intentional self-harm, initial encounter: Secondary | ICD-10-CM | POA: Diagnosis not present

## 2016-06-03 DIAGNOSIS — T1491XA Suicide attempt, initial encounter: Secondary | ICD-10-CM | POA: Diagnosis not present

## 2016-06-03 DIAGNOSIS — Z880 Allergy status to penicillin: Secondary | ICD-10-CM

## 2016-06-03 DIAGNOSIS — Z818 Family history of other mental and behavioral disorders: Secondary | ICD-10-CM | POA: Diagnosis not present

## 2016-06-03 DIAGNOSIS — T434X2A Poisoning by butyrophenone and thiothixene neuroleptics, intentional self-harm, initial encounter: Secondary | ICD-10-CM | POA: Diagnosis not present

## 2016-06-03 DIAGNOSIS — Z825 Family history of asthma and other chronic lower respiratory diseases: Secondary | ICD-10-CM

## 2016-06-03 DIAGNOSIS — Z91011 Allergy to milk products: Secondary | ICD-10-CM

## 2016-06-03 DIAGNOSIS — Z833 Family history of diabetes mellitus: Secondary | ICD-10-CM

## 2016-06-03 DIAGNOSIS — Z8249 Family history of ischemic heart disease and other diseases of the circulatory system: Secondary | ICD-10-CM

## 2016-06-03 MED ORDER — MIRTAZAPINE 7.5 MG PO TABS: 7.5000 mg | ORAL_TABLET | Freq: Every day | ORAL | Status: DC

## 2016-06-03 MED ORDER — MIRTAZAPINE 15 MG PO TABS
15.0000 mg | ORAL_TABLET | Freq: Every day | ORAL | Status: DC
  Administered 2016-06-03 – 2016-06-04 (×2): 15 mg via ORAL
  Filled 2016-06-03 (×3): qty 1

## 2016-06-03 NOTE — H&P (Signed)
Psychiatric Admission Assessment Adult  Patient Identification: Deanna [name] MRN:  [id number] Date of Evaluation:  06/03/2016 Chief Complaint:  " I took some pills " Principal Diagnosis: overdose- undetermined intent  Diagnosis:   Patient Active Problem List   Diagnosis Date Noted  . Bipolar 1 disorder (HCC) [F31.9] 06/02/2016  . Bipolar affective disorder, current episode depressed (HCC) [F31.30] 06/02/2016  . OVARIAN CYST, RIGHT [N83.209] 06/30/2008  . ECZEMA, ATOPIC DERMATITIS [L20.89] 08/20/2006   History of Present Illness: 20 year old female, reports she overdosed on prescribed medications ( Remeron and Haloperidol). States she took " about five of each",was not trying to commit suicide, but was frustrated, angry , and " just  wanted to get some sleep- that's why I took them". She states that after taking them she felt " weird, with jerking movements that scared me", so asked to go to hospital. As per ED notes, [facility] stated patient had made suicidal statements in the context of argument with SO. At this time she denies any suicidal ideations and denies feeling depressed . Denies neuro-vegetative symptoms of depression. Patient states she has been diagnosed with Schizoaffective Disorder in the past, but at this time denies any clear history of mania or of depressive episodes . She describes a long history of feeling vaguely paranoid, but without hallucinations or any actual delusional ideations. She describes history of intermittent explosiveness, angry outbursts, which she states were significant but have improved as she gets older. Associated Signs/Symptoms: Depression Symptoms:  Denies any neuro-vegetative symptoms- sleep, appetite, energy level all within normal, denies anhedonia.  (Hypo) Manic Symptoms:  Does not endorse  Anxiety Symptoms:  Denies  Psychotic Symptoms:  Does not endorse hallucinations, no delusions expressed  PTSD Symptoms: Denies  Total Time spent with patient:  45 minutes  Past Psychiatric History: denies psychiatric admissions, has never attempted suicide, denies history of self cutting or self injurious ideations, states she was diagnosed with Schizoaffective Disorder a few months ago. Of note, denies any history of hallucinations, but states she often feels " paranoid, like feeling that there are people in the house ". Denies episodes of significant or severe depression, describes brief mood swings  Denies panic or agoraphobia, denies excessive anxiety, denies PTSD  Describes history of angry outbursts, explosiveness, but does not endorse any clear history of mania or hypomania. She had been prescribed Depakote, Tegretol in the past, but did not tolerate them well . Had been prescribed Remeron, Haloperidol, Vistaril recently   Is the patient at risk to self? Yes.    Has the patient been a risk to self in the past 6 months? No.  Has the patient been a risk to self within the distant past? No.  Is the patient a risk to others? No.  Has the patient been a risk to others in the past 6 months? No.  Has the patient been a risk to others within the distant past? No.   Prior Inpatient Therapy:  denies  Prior Outpatient Therapy:  [facility]  Alcohol Screening: 1. How often do you have a drink containing alcohol?: Never 9. Have you or someone else been injured as a result of your drinking?: No 10. Has a relative or friend or a doctor or another health worker been concerned about your drinking or suggested you cut down?: No Alcohol Use Disorder Identification Test Final Score (AUDIT): 0 Brief Intervention: AUDIT score less than 7 or less-screening does not suggest unhealthy drinking-brief intervention not indicated Substance Abuse History in the  last 12 months:  Denies alcohol or drug abuse  Consequences of Substance Abuse: Denies  Previous Psychotropic Medications: Haloperidol, Remeron, Vistaril x several months. In the past tried Depakote , caused hair  loss, and Tegretol, caused worsening eczema. Psychological Evaluations:  No  Past Medical History:  Past Medical History:  Diagnosis Date  . Allergy   . Anxiety   . Asthma   . Depression   . Eczema   . Eczema   . Ovarian cyst    resolved    Past Surgical History:  Procedure Laterality Date  . OVARIAN CYST REMOVAL     Family History:  Parents alive, separated, has three siblings  Family History  Problem Relation Age of Onset  . Asthma Mother   . Bipolar disorder Mother   . Asthma Father   . Hypertension Father   . Bipolar disorder Father   . Hypertension Paternal Grandmother   . Diabetes Paternal Grandmother   . Asthma Paternal Grandmother    Family Psychiatric  History: denies any psychiatric illness or substance abuse in family, denies any history of suicides or suicide attempts in family. Tobacco Screening: smokes one cigar and 1-2 cigarettes per day Social History: single,  no children, employed at a [employer], lives with roommate, currently on parole , was incarcerated up to July 2017.  History  Alcohol Use No     History  Drug Use No    Additional Social History:      Pain Medications: ibuprofen Prescriptions: benadryl  pepcid   haldol   vistaril   remeron  prednisone Over the Counter: ibuprofen History of alcohol / drug use?: No history of alcohol / drug abuse Longest period of sobriety (when/how long): none Negative Consequences of Use: Work / Programmer, multimedia (denied drug/alcohol use) Withdrawal Symptoms: Other (Comment) (denied drug/alcohol use)  Allergies:   Allergies  Allergen Reactions  . D[employer]       . Penicillins Rash    .Marland KitchenHas patient had a PCN reaction causing immediate rash, facial/tongue/throat swelling, SOB or lightheadedness with hypotension: No Has patient had a PCN reaction causing severe rash involving mucus membranes or skin necrosis: No Has patient had a PCN reaction that required hospitalization No Has patient had a  PCN reaction occurring within the last 10 years: No If all of the above answers are "NO", then may proceed with Cephalosporin use.    Lab Results: No results found for this or any previous visit (from the past 48 hour(s)).  Blood Alcohol level:  Lab Results  Component Value Date   ETH <5 06/01/2016    Metabolic Disorder Labs:  No results found for: HGBA1C, MPG No results found for: PROLACTIN No results found for: CHOL, TRIG, HDL, CHOLHDL, VLDL, LDLCALC  Current Medications: Current Facility-Administered Medications  Medication Dose Route Frequency Provider Last Rate Last Dose  . acetaminophen (TYLENOL) tablet 650 mg  650 mg Oral Q6H PRN Charm Rings, NP      . alum & mag hydroxide-simeth (MAALOX/MYLANTA) 200-200-20 MG/5ML suspension 30 mL  30 mL Oral PRN Charm Rings, NP      . carbamazepine (TEGRETOL) tablet 200 mg  200 mg Oral BID PC Charm Rings, NP   200 mg at 06/03/16 1610  . hydrOXYzine (ATARAX/VISTARIL) tablet 25 mg  25 mg Oral QHS Charm Rings, NP   25 mg at 06/02/16 2114  . ibuprofen (ADVIL,MOTRIN) tablet 600 mg  600 mg Oral Q8H PRN Charm Rings, NP      .  magnesium hydroxide (MILK OF MAGNESIA) suspension 30 mL  30 mL Oral Daily PRN Charm Rings, NP      . ondansetron [facility]) tablet 4 mg  4 mg Oral Q8H PRN Charm Rings, NP       PTA Medications: Prescriptions Prior to Admission  Medication Sig Dispense Refill Last Dose  . haloperidol (HALDOL) 2 MG tablet Take 2 mg by mouth daily.   05/31/2016 at Unknown time  . hydrOXYzine (ATARAX/VISTARIL) 25 MG tablet Take 25 mg by mouth at bedtime.  0 05/31/2016 at Unknown time  . mirtazapine (REMERON) 30 MG tablet Take 30 mg by mouth at bedtime.   05/31/2016 at Unknown time  . diphenhydrAMINE (BENADRYL) 25 MG tablet Take 1 tablet (25 mg total) by mouth every 6 (six) hours. (Patient not taking: Reported on 06/03/2016) 20 tablet 0 Not Taking at Unknown time  . famotidine (PEPCID) 20 MG tablet Take 1 tablet (20 mg total) by  mouth 2 (two) times daily. (Patient not taking: Reported on 06/03/2016) 20 tablet 0 Not Taking at Unknown time  . ibuprofen (ADVIL,MOTRIN) 600 MG tablet Take 1 tablet (600 mg total) by mouth every 6 (six) hours as needed. (Patient not taking: Reported on 06/03/2016) 30 tablet 0 Not Taking at Unknown time  . predniSONE (DELTASONE) 10 MG tablet Take 2 tablets (20 mg total) by mouth daily. (Patient not taking: Reported on 06/03/2016) 14 tablet 0 Not Taking at Unknown time    Musculoskeletal: Strength & Muscle Tone: within normal limits Gait & Station: normal Patient leans: N/A  Psychiatric Specialty Exam: Physical Exam  Review of Systems  HENT: Positive for congestion.   Eyes: Positive for blurred vision.  Respiratory: Negative.   Cardiovascular: Negative.   Gastrointestinal: Negative.   Genitourinary: Negative.   Musculoskeletal: Negative.   Skin:       eczema  Neurological: Negative for seizures.  Endo/Heme/Allergies: Negative.   All other systems reviewed and are negative.   Blood pressure 110/76, pulse (!) 130, temperature 98.6 F (37 C), temperature source Oral, resp. rate 16, height 5\' 3"  (1.6 m), weight 148 lb (67.1 kg), last menstrual period 05/23/2016, SpO2 100 %.Body mass index is 26.22 kg/m.  General Appearance: Fairly Groomed  Eye Contact:  Good  Speech:  Normal Rate  Volume:  Normal  Mood:  denies depression, describes mood as " fine "   Affect:  appropriate, reactive, mildly irritable   Thought Process:  Linear  Orientation:  Full (Time, Place, and Person)  Thought Content:  no hallucinations, no delusions, not internally preoccupied  Suicidal Thoughts:  No- denies any suicidal or self injurious ideations, denies any homicidal or violent ideations  Homicidal Thoughts:  No  Memory:  recent and remote grossly intact   Judgement:  Fair  Insight:  Fair  Psychomotor Activity:  Normal  Concentration:  Concentration: Good and Attention Span: Good  Recall:  Good  Fund  of Knowledge:  Good  Language:  Good  Akathisia:  Negative  Handed:  Right  AIMS (if indicated):     Assets:  Desire for Improvement Resilience  ADL's:  Intact  Cognition:  WNL  Sleep:  Number of Hours: 6.5    Treatment Plan Summary: Daily contact with patient to assess and evaluate symptoms and progress in treatment, Medication management, Plan inpatient admission and medications as below  Observation Level/Precautions:  15 minute checks  Laboratory:  As needed   Psychotherapy:milieu, support, group therapy    Medications:  We discussed at length  As above, patient states she feels her symptoms - primarily vague paranoia and explosive , angry episodes- have improved significantly as she gets older, and are no longer as significant or distressing to her. She does not want to continue Tegretol ( started yesterday) or resume haloperidol , states " I don't need them and I don't need the side effects that come with them". She does state Remeron was helping her- for sleep and  Felt better on it , without side effects. D/C Tegretol Restart Remeron 15 mgrs QHS  Continue Vistaril PRNs for anxiety  Consultations:  As needed   Discharge Concerns:-    Estimated LOS:4 days   Other:     Physician Treatment Plan for Primary Diagnosis: overdose- undetermined intent  Long Term Goal(s): Improvement in symptoms so as ready for discharge  Short Term Goals: Ability to verbalize feelings will improve, Ability to disclose and discuss suicidal ideas, Ability to demonstrate self-control will improve and Ability to identify and develop effective coping behaviors will improve  Physician Treatment Plan for Secondary Diagnosis: Active Problems:   Bipolar affective disorder, current episode depressed (HCC)  Long Term Goal(s): Improvement in symptoms so as ready for discharge  Short Term Goals: Ability to verbalize feelings will improve, Ability to disclose and discuss suicidal ideas, Ability to demonstrate  self-control will improve and Ability to identify and develop effective coping behaviors will improve  I certify that inpatient services furnished can reasonably be expected to improve the patient's condition.    Nehemiah MassedOBOS, Johnjoseph Rolfe, MD 12/12/20178:53 AM

## 2016-06-03 NOTE — [facility] Group Notes (Addendum)
[facility] LCSW Group Therapy  06/03/2016   1:15 PM   Type of Therapy:  Group Therapy  Participation Level:  Minimal  Participation Quality:  Limited  Affect:  Depressed and Flat  Cognitive:  Alert and Oriented  Insight:  Developing/Improving and Engaged  Engagement in Therapy:  Developing/Improving and Engaged  Modes of Intervention:  Clarification, Confrontation, Discussion, Education, Exploration, Limit-setting, Orientation, Problem-solving, Rapport Building, Dance movement psychotherapisteality Testing, Socialization and Support  Summary of Progress/Problems: The topic for group therapy was feelings about diagnosis.  Pt actively participated in group discussion on their past and current diagnosis and how they feel towards this.  Pt also identified how society and family members judge them, based on their diagnosis as well as stereotypes and stigmas.  Patient engaged in therapeutic letter writing activity but participated minimally in discussion despite CSW encouragement.   Samuella BruinKristin Willodene Stallings, MSW, LCSW Clinical Social Worker [facility] [phone]

## 2016-06-03 NOTE — Tx Team (Signed)
Interdisciplinary Treatment and Diagnostic Plan Update  06/03/2016 Time of Session: 10am Deanna [name] MRN: [id number]  Principal Diagnosis: Active Problems:   Bipolar affective disorder, current episode depressed (HCC)   Current Medications:  Current Facility-Administered Medications  Medication Dose Route Frequency Provider Last Rate Last Dose  . acetaminophen (TYLENOL) tablet 650 mg  650 mg Oral Q6H PRN Charm RingsJamison Y Lord, NP      . alum & mag hydroxide-simeth (MAALOX/MYLANTA) 200-200-20 MG/5ML suspension 30 mL  30 mL Oral PRN Charm RingsJamison Y Lord, NP      . hydrOXYzine (ATARAX/VISTARIL) tablet 25 mg  25 mg Oral QHS Charm RingsJamison Y Lord, NP   25 mg at 06/02/16 2114  . ibuprofen (ADVIL,MOTRIN) tablet 600 mg  600 mg Oral Q8H PRN Charm RingsJamison Y Lord, NP      . magnesium hydroxide (MILK OF MAGNESIA) suspension 30 mL  30 mL Oral Daily PRN Charm RingsJamison Y Lord, NP      . mirtazapine (REMERON) tablet 15 mg  15 mg Oral QHS Rockey SituFernando A Cobos, MD      . ondansetron (ZOFRAN) tablet 4 mg  4 mg Oral Q8H PRN Charm RingsJamison Y Lord, NP       PTA Medications: Prescriptions Prior to Admission  Medication Sig Dispense Refill Last Dose  . haloperidol (HALDOL) 2 MG tablet Take 2 mg by mouth daily.   05/31/2016 at Unknown time  . hydrOXYzine (ATARAX/VISTARIL) 25 MG tablet Take 25 mg by mouth at bedtime.  0 05/31/2016 at Unknown time  . mirtazapine (REMERON) 30 MG tablet Take 30 mg by mouth at bedtime.   05/31/2016 at Unknown time  . diphenhydrAMINE (BENADRYL) 25 MG tablet Take 1 tablet (25 mg total) by mouth every 6 (six) hours. (Patient not taking: Reported on 06/03/2016) 20 tablet 0 Not Taking at Unknown time  . famotidine (PEPCID) 20 MG tablet Take 1 tablet (20 mg total) by mouth 2 (two) times daily. (Patient not taking: Reported on 06/03/2016) 20 tablet 0 Not Taking at Unknown time  . ibuprofen (ADVIL,MOTRIN) 600 MG tablet Take 1 tablet (600 mg total) by mouth every 6 (six) hours as needed. (Patient not taking: Reported on 06/03/2016)  30 tablet 0 Not Taking at Unknown time  . predniSONE (DELTASONE) 10 MG tablet Take 2 tablets (20 mg total) by mouth daily. (Patient not taking: Reported on 06/03/2016) 14 tablet 0 Not Taking at Unknown time    Patient Stressors: Financial difficulties Marital or family conflict Medication change or noncompliance Occupational concerns  Patient Strengths: Ability for insight Active sense of humor Average or above average intelligence Capable of independent living MetallurgistCommunication skills Financial means General fund of knowledge Motivation for treatment/growth Physical Health Supportive family/friends Work skills  Treatment Modalities: Medication Management, Group therapy, Case management,  1 to 1 session with clinician, Psychoeducation, Recreational therapy.   Physician Treatment Plan for Primary Diagnosis: Active Problems:   Bipolar affective disorder, current episode depressed (HCC)  Long Term Goal(s): Improvement in symptoms so as ready for discharge Improvement in symptoms so as ready for discharge   Short Term Goals: Ability to verbalize feelings will improve Ability to disclose and discuss suicidal ideas Ability to demonstrate self-control will improve Ability to identify and develop effective coping behaviors will improve Ability to verbalize feelings will improve Ability to disclose and discuss suicidal ideas Ability to demonstrate self-control will improve Ability to identify and develop effective coping behaviors will improve     Medication Management: Evaluate patient's response, side effects, and tolerance of medication regimen.  Therapeutic Interventions: 1 to 1 sessions, Unit Group sessions and Medication administration.  Evaluation of Outcomes: Progressing   RN Treatment Plan for Primary Diagnosis:  Bipolar affective disorder, current episode depressed (HCC) Long Term Goal(s): Knowledge of disease and therapeutic regimen to maintain health will  improve  Short Term Goals: Ability to remain free from injury will improve, Ability to disclose and discuss suicidal ideas, Ability to identify and develop effective coping behaviors will improve and Compliance with prescribed medications will improve  Medication Management: RN will administer medications as ordered by provider, will assess and evaluate patient's response and provide education to patient for prescribed medication. RN will report any adverse and/or side effects to prescribing provider.  Therapeutic Interventions: 1 on 1 counseling sessions, Psychoeducation, Medication administration, Evaluate responses to treatment, Monitor vital signs and CBGs as ordered, Perform/monitor CIWA, COWS, AIMS and Fall Risk screenings as ordered, Perform wound care treatments as ordered.  Evaluation of Outcomes: Progressing   LCSW Treatment Plan for Primary Diagnosis:  Bipolar affective disorder, current episode depressed (HCC) Long Term Goal(s): Safe transition to appropriate next level of care at discharge, Engage patient in therapeutic group addressing interpersonal concerns.  Short Term Goals: Engage patient in aftercare planning with referrals and resources, Increase social support, Increase emotional regulation, Identify triggers associated with mental health/substance abuse issues and Increase skills for wellness and recovery  Therapeutic Interventions: Assess for all discharge needs, 1 to 1 time with Social worker, Explore available resources and support systems, Assess for adequacy in community support network, Educate family and significant other(s) on suicide prevention, Complete Psychosocial Assessment, Interpersonal group therapy.  Evaluation of Outcomes: Progressing   Progress in Treatment :  Attending groups: Continuing to assess  Participating in groups: Continuing to assess  Taking medication as prescribed: Yes, MD continuing to assess for appropriate medication  regimen  Toleration medication: Yes  Family/Significant other contact made: No, patient has declined for collateral contact   Patient understands diagnosis: Yes  Discussing patient identified problems/goals with staff: Yes  Medical problems stabilized or resolved: Yes  Denies suicidal/homicidal ideation: Yes, denies  Issues/concerns per patient self-inventory: None reported  Other: N/A  New problem(s) identified: None reported at this time    New Short Term/Long Term Goal(s): None at this time    Discharge Plan or Barriers: Patient plans to return home [facility]    Reason for Continuation of Hospitalization: Anxiety Depression Medication stabilization Suicidal Ideations Withdrawal symptoms  Estimated Length of Stay: 2-3 days    Attendees: Patient: 06/03/2016    Physician: Dr. Jama Flavorsobos 06/03/2016    Nursing: Gilmer MorElizabeth I., Joslyn Devonaroline Beaudry, RN 06/03/2016    RN Care Manager: Onnie BoerJennifer Clark, RN 06/03/2016    Social Worker: Belenda CruiseKristin Nary Sneed, LCSW 06/03/2016    Recreational Therapist:  06/03/2016    Other: Armandina StammerAgnes Nwoko, NP; Gray BernhardtMay Augustin, NP;  06/03/2016     Samuella BruinKristin Pansie Guggisberg, LCSW Clinical Social Worker [facility] [phone]

## 2016-06-03 NOTE — [facility] Suicide Risk Assessment (Signed)
[facility] INPATIENT:  Family/Significant Other Suicide Prevention Education  Suicide Prevention Education:  Patient Refusal for Family/Significant Other Suicide Prevention Education: The patient Deanna [name] has refused to provide written consent for family/significant other to be provided Family/Significant Other Suicide Prevention Education during admission and/or prior to discharge.  Physician notified. SPE reviewed with patient and brochure provided. Patient encouraged to return to hospital if having suicidal thoughts, patient verbalized his/her understanding and has no further questions at this time.   Deanna [name] 06/03/2016, 11:07 AM

## 2016-06-03 NOTE — [facility] Suicide Risk Assessment (Signed)
[facility]   Nursing information obtained from:  Patient Demographic factors:  Adolescent or young adult, Low socioeconomic status Current Mental Status:  NA Loss Factors:  Loss of significant relationship, Financial problems / change in socioeconomic status Historical Factors:  NA Risk Reduction Factors:  Employed  Total Time spent with patient: 45 minutes Principal Problem: overdose, undetermined intent  Diagnosis:   Patient Active Problem List   Diagnosis Date Noted  . Bipolar 1 disorder (HCC) [F31.9] 06/02/2016  . Bipolar affective disorder, current episode depressed (HCC) [F31.30] 06/02/2016  . OVARIAN CYST, RIGHT [N83.209] 06/30/2008  . ECZEMA, ATOPIC DERMATITIS [L20.89] 08/20/2006     Continued Clinical Symptoms:  Alcohol Use Disorder Identification Test Final Score (AUDIT): 0 The "Alcohol Use Disorders Identification Test", Guidelines for Use in Primary Care, Second Edition.  World Science writerHealth Organization [facility](WHO). Score between 0-7:  no or low risk or alcohol related problems. Score between 8-15:  moderate risk of alcohol related problems. Score between 16-19:  high risk of alcohol related problems. Score 20 or above:  warrants further diagnostic evaluation for alcohol dependence and treatment.   CLINICAL FACTORS:  20 year old female, admitted following an overdose on prescribed Haldol and Remeron. At this time denies suicidal intention, states she was trying to get some sleep, but ED notes indicate patient had made suicidal statements in the context of argument with SO. Patient has been diagnosed with Schizoaffective Disorder in the past, but at this time denies any clear history of severe depressive episodes, manic or hypomanic episodes, or of overt psychotic symptoms.      Psychiatric Specialty Exam: Physical Exam  ROS  Blood pressure 110/76, pulse (!) 130, temperature 98.6 F (37 C), temperature source Oral, resp. rate 16, height 5\' 3"  (1.6  m), weight 148 lb (67.1 kg), last menstrual period 05/23/2016, SpO2 100 %.Body mass index is 26.22 kg/m.  See admit note MSE    COGNITIVE FEATURES THAT CONTRIBUTE TO RISK:  Closed-mindedness and Loss of executive function    SUICIDE RISK:  Moderate   PLAN OF CARE: Patient will be admitted to inpatient psychiatric unit for stabilization and safety. Will provide and encourage milieu participation. Provide medication management and maked adjustments as needed.  Will follow daily.    I certify that inpatient services furnished can reasonably be expected to improve the patient's condition.  Nehemiah MassedOBOS, Sharene Krikorian, MD 06/03/2016, 10:53 AM

## 2016-06-03 NOTE — Progress Notes (Signed)
Patient ID: Deanna [name], female   DOB: 12/23/1995, 20 y.o.   MRN: [id number]  Pt currently presents with a flat affect and guarded behavior. Pt forwards little to Clinical research associatewriter. Pt reports that she does not have a goal for today or tomorrow and does not wish to be here. Pt states "I have to get back to my job." Pt reports good sleep with current medication regimen. Pt remains on the phone for most of the night tonight.  Pt provided with medications per providers orders. Pt's labs and vitals were monitored throughout the night. Pt supported emotionally and encouraged to express concerns and questions. Pt educated on medications.  Pt's safety ensured with 15 minute and environmental checks. Pt currently denies SI/HI and A/V hallucinations. Pt verbally agrees to seek staff if SI/HI or A/VH occurs and to consult with staff before acting on any harmful thoughts. Will continue POC.

## 2016-06-03 NOTE — Progress Notes (Signed)
D: Patient presents with flat, blunted affect; her mood is depressed.  She rates her depression as a 5; denies any hopelessness.  Patient denies any thoughts of self harm.  Patient maintains that she was trying to hurt herself when she took the medications.  Patient is observed in the dayroom interacting with her peers.  She tends to isolate at times.  Her goal today is to "get back to work."   A: Continue to monitor medication management and MD orders.  Safety checks completed every 15 minutes per protocol.  Offer support and encouragement as needed. R: Patient is receptive to staff; her behavior is appropriate.

## 2016-06-03 NOTE — [facility] Group Notes (Signed)
Recovery Group:  This group is centered on recovery and using coping skills.  It is also goal oriented and led by nursing staff.  Patient invited; declined to attend.  Patient is isolative and withdrawn.

## 2016-06-03 NOTE — [facility] Counselor (Signed)
Adult Comprehensive Assessment  Patient ID: Deanna [name], female   DOB: [birth year], 20 y.o.   MRN: [id number]  Information Source: Information source: Patient  Current Stressors:  Educational / Learning stressors: N/A Employment / Job issues: Works at [employer] since Sept. 2017 Family Relationships: Gets along well with family Surveyor, quantityinancial / Lack of resources (include bankruptcy): Denies Housing / Lack of housing: [place] Physical health (include injuries & life threatening diseases): Denies Social relationships: Denies Substance abuse: Denies Bereavement / Loss: Denies  Living/Environment/Situation:  Living Arrangements: Non-relatives/Friends Living conditions (as described by patient or guardian): [place] How long has patient lived in current situation?: Nov. 2017 What is atmosphere in current home: Comfortable  Family History:  Marital status: Single Are you sexually active?: No Does patient have children?: No  Childhood History:  By whom was/is the patient raised?: Both parents Description of patient's relationship with caregiver when they were a child: Good relationship with parents Patient's description of current relationship with people who raised him/her: Good relationship with parents Does patient have siblings?: Yes Number of Siblings: 3 Description of patient's current relationship with siblings: Gets along well with siblings Did patient suffer any verbal/emotional/physical/sexual abuse as a child?: No Did patient suffer from severe childhood neglect?: No Has patient ever been sexually abused/assaulted/raped as an adolescent or adult?: No Was the patient ever a victim of a crime or a disaster?: No Witnessed domestic violence?: No Has patient been effected by domestic violence as an adult?: No  Education:  Highest grade of school patient has completed: 12 Currently a  student?: No  Employment/Work Situation:   Employment situation: Employed Where is patient currently employed?: Works at [employer] since Sept. 2017 Patient's job has been impacted by current illness: No What is the longest time patient has a held a job?: unknown Has patient ever been in the [employer] and Companymilitary?: No  Financial Resources:   Surveyor, quantityinancial resources: Income from employment Does patient have a representative payee or guardian?: No  Alcohol/Substance Abuse:   What has been your use of drugs/alcohol within the last 12 months?: Denies If attempted suicide, did drugs/alcohol play a role in this?: No Alcohol/Substance Abuse Treatment Hx: Denies past history  Social Support System:   Forensic psychologistatient's Community Support System: Fair Museum/gallery exhibitions officerDescribe Community Support System: Family and roommate Type of faith/religion: Unknown  Leisure/Recreation:   Leisure and Hobbies: unknown  Strengths/Needs:   What things does the patient do well?: unknown In what areas does patient struggle / problems for patient: unknown  Discharge Plan:   Does patient have access to transportation?: Yes Will patient be returning to same living situation after discharge?: Yes Currently receiving community mental health services: Yes (From Whom) Museum/gallery curator([facility]) If no, would patient like referral for services when discharged?: No Does patient have financial barriers related to discharge medications?: No  Summary/Recommendations:   Patient is a 20 year old female who presented to the hospital with a suspected overdose on prescription medications, though patient denies a suicide attempt. Primary triggers for admission include an argument with her roommate. Patient will benefit from crisis stabilization, medication evaluation, group therapy and psycho education in addition to case management for discharge planning. At discharge, it is recommended that Pt remain compliant with established discharge plan and continued treatment.   Deanna [name]  L Deanna [name]. 06/03/2016

## 2016-06-03 NOTE — Progress Notes (Signed)
At patient's request, CSW spoke with patient's employer at [employer] to inform him of patient's hospitalization.  Samuella BruinKristin Lorieann Argueta, LCSW Clinical Social Worker [facility] [phone]

## 2016-06-04 DIAGNOSIS — T43022A Poisoning by tetracyclic antidepressants, intentional self-harm, initial encounter: Secondary | ICD-10-CM | POA: Diagnosis not present

## 2016-06-04 DIAGNOSIS — T1491XA Suicide attempt, initial encounter: Secondary | ICD-10-CM | POA: Diagnosis not present

## 2016-06-04 DIAGNOSIS — Z818 Family history of other mental and behavioral disorders: Secondary | ICD-10-CM | POA: Diagnosis not present

## 2016-06-04 DIAGNOSIS — T434X2A Poisoning by butyrophenone and thiothixene neuroleptics, intentional self-harm, initial encounter: Secondary | ICD-10-CM | POA: Diagnosis not present

## 2016-06-04 MED ORDER — HYDROXYZINE HCL 25 MG PO TABS
25.0000 mg | ORAL_TABLET | Freq: Four times a day (QID) | ORAL | Status: DC | PRN
  Filled 2016-06-04: qty 10

## 2016-06-04 MED ORDER — NAPHAZOLINE-GLYCERIN 0.012-0.2 % OP SOLN
1.0000 [drp] | Freq: Four times a day (QID) | OPHTHALMIC | Status: DC | PRN
  Administered 2016-06-04: 2 [drp] via OPHTHALMIC
  Filled 2016-06-04: qty 15

## 2016-06-04 NOTE — Progress Notes (Signed)
Patient ID: Deanna FlemingsNykeshia D [name], female   DOB: 07/11/1995, 20 y.o.   MRN: [id number]  Pt currently presents with a masked affect and guarded behavior. Pt forwards little to Clinical research associatewriter. Pt reports to writer that their goal is to "go home." Pt states "I am fine, I had a good day. But I have one every day." Pt reports good sleep with current medication regimen. Pt interacts minimally with staff and peers. Resistant to care.   Pt provided with medications per providers orders. Pt's labs and vitals were monitored throughout the night. Pt supported emotionally and encouraged to express concerns and questions. Pt educated on medications.  Pt's safety ensured with 15 minute and environmental checks. Pt currently denies SI/HI and A/V hallucinations. Pt verbally agrees to seek staff if SI/HI or A/VH occurs and to consult with staff before acting on any harmful thoughts. Will continue POC.

## 2016-06-04 NOTE — Progress Notes (Signed)
[facility] MD Progress Note  06/04/2016 4:22 PM Deanna [name]  MRN:  [id number] Subjective:  Patient reports she is feeling "OK", and at this time minimizes depression, denies any suicidal ideations . States she is hoping for discharge soon, because " I have a job and I do not want to lose it."  At this time denies psychotic symptoms or feeling paranoid. States " I think I felt paranoid after I got out of jail I was accustomed to being in a bay with 30 other women, and then I went to being alone after I got out". States her vague feelings of paranoia have subsided, and at this time denies feeling fearful or paranoid. Denies medication side effects. Objective : I have discussed case with treatment team and have met with patient . Patient presents with improved range of affect. Denies suicidal ideations, denies psychotic symptoms, and does not appear internally preoccupied. Presents calm and better related today. She is focused on being discharged soon as she states that if she does not return to work soon she may lose her job and that her probation status requires she not be homeless. States " If I lose my job, I would end up being homeless , and I don't want to go back to jail". No disruptive or agitated behaviors on unit. Limited milieu participation.  . Principal Problem: Overdose- undetermined intent  Diagnosis:   Patient Active Problem List   Diagnosis Date Noted  . Bipolar 1 disorder (HCC) [F31.9] 06/02/2016  . Bipolar affective disorder, current episode depressed (HCC) [F31.30] 06/02/2016  . OVARIAN CYST, RIGHT [N83.209] 06/30/2008  . ECZEMA, ATOPIC DERMATITIS [L20.89] 08/20/2006   Total Time spent with patient: 20 minutes  Past Medical History:  Past Medical History:  Diagnosis Date  . Allergy   . Anxiety   . Asthma   . Depression   . Eczema   . Eczema   . Ovarian cyst    resolved    Past Surgical History:  Procedure Laterality Date  . OVARIAN CYST REMOVAL     Family  History:  Family History  Problem Relation Age of Onset  . Asthma Mother   . Bipolar disorder Mother   . Asthma Father   . Hypertension Father   . Bipolar disorder Father   . Hypertension Paternal Grandmother   . Diabetes Paternal Grandmother   . Asthma Paternal Grandmother     Social History:  History  Alcohol Use No     History  Drug Use No    Social History   Social History  . Marital status: Single    Spouse name: N/A  . Number of children: N/A  . Years of education: N/A   Social History Main Topics  . Smoking status: Current Every Day Smoker    Packs/day: 1.00    Years: 5.00    Types: Cigarettes  . Smokeless tobacco: Never Used  . Alcohol use No  . Drug use: No  . Sexual activity: Yes    Partners: Female    Birth control/ protection: None   Other Topics Concern  . None   Social History Narrative  . None   Additional Social History:    Pain Medications: ibuprofen Prescriptions: benadryl  pepcid   haldol   vistaril   remeron  prednisone Over the Counter: ibuprofen History of alcohol / drug use?: No history of alcohol / drug abuse Longest period of sobriety (when/how long): none Negative Consequences of Use: Work / School (denied drug/alcohol use)   Withdrawal Symptoms: Other (Comment) (denied drug/alcohol use)  Sleep: Good  Appetite:  Good  Current Medications: Current Facility-Administered Medications  Medication Dose Route Frequency Provider Last Rate Last Dose  . acetaminophen (TYLENOL) tablet 650 mg  650 mg Oral Q6H PRN Jamison Y Lord, NP      . alum & mag hydroxide-simeth (MAALOX/MYLANTA) 200-200-20 MG/5ML suspension 30 mL  30 mL Oral PRN Jamison Y Lord, NP      . hydrOXYzine (ATARAX/VISTARIL) tablet 25 mg  25 mg Oral QHS Jamison Y Lord, NP   25 mg at 06/03/16 2129  . ibuprofen (ADVIL,MOTRIN) tablet 600 mg  600 mg Oral Q8H PRN Jamison Y Lord, NP      . magnesium hydroxide (MILK OF MAGNESIA) suspension 30 mL  30 mL Oral Daily PRN Jamison Y Lord,  NP      . mirtazapine (REMERON) tablet 15 mg  15 mg Oral QHS  A , MD   15 mg at 06/03/16 2129  . ondansetron (ZOFRAN) tablet 4 mg  4 mg Oral Q8H PRN Jamison Y Lord, NP        Lab Results: No results found for this or any previous visit (from the past 48 hour(s)).  Blood Alcohol level:  Lab Results  Component Value Date   ETH <5 06/01/2016    Metabolic Disorder Labs: No results found for: HGBA1C, MPG No results found for: PROLACTIN No results found for: CHOL, TRIG, HDL, CHOLHDL, VLDL, LDLCALC  Physical Findings: AIMS: Facial and Oral Movements Muscles of Facial Expression: None, normal Lips and Perioral Area: None, normal Jaw: None, normal Tongue: None, normal,Extremity Movements Upper (arms, wrists, hands, fingers): None, normal Lower (legs, knees, ankles, toes): None, normal, Trunk Movements Neck, shoulders, hips: None, normal, Overall Severity Severity of abnormal movements (highest score from questions above): None, normal Incapacitation due to abnormal movements: None, normal Patient's awareness of abnormal movements (rate only patient's report): No Awareness, Dental Status Current problems with teeth and/or dentures?: No Does patient usually wear dentures?: No  CIWA:  CIWA-Ar Total: 1 COWS:  COWS Total Score: 2  Musculoskeletal: Strength & Muscle Tone: within normal limits Gait & Station: normal Patient leans: N/A  Psychiatric Specialty Exam: Physical Exam  ROS denies headache, denies chest pain, denies nausea or vomiting , reports chronic eczema  Blood pressure 105/70, pulse (!) 107, temperature 97.9 F (36.6 C), temperature source Oral, resp. rate 18, height 5' 3" (1.6 m), weight 148 lb (67.1 kg), last menstrual period 05/23/2016, SpO2 100 %.Body mass index is 26.22 kg/m.  General Appearance: improved grooming   Eye Contact:  Good  Speech:  Normal Rate  Volume:  Normal  Mood:  improving mood  Affect:  more reactive, smiles at times appropriately   Thought Process:  Linear  Orientation:  Full (Time, Place, and Person)  Thought Content:  denies hallucinations, no delusions expressed, does not appear internally preoccupied   Suicidal Thoughts:  No denies any suicidal or self injurious ideations   Homicidal Thoughts:  No denies any homicidal or violent ideations  Memory:  recent and remote grossly intact   Judgement: improving   Insight:  Fair  Psychomotor Activity:  Normal  Concentration:  Concentration: Good and Attention Span: Good  Recall:  Good  Fund of Knowledge:  Good  Language:  Good  Akathisia:  Negative  Handed:  Right  AIMS (if indicated):     Assets:  Communication Skills Desire for Improvement Resilience  ADL's:  Intact  Cognition:  WNL  Sleep:    Number of Hours: 6   Assessment - patient denies significant depression, psychotic symptoms, or any suicidal ideations. Described history of some paranoid ideations, such as feeling someone had broken in to her home, but states this feeling has subsided and attributes it to adjusting back to normal life/routine after being released from jail. At this time not presenting with any paranoia or psychosis. Focused on being discharged soon, returning to work soon  Treatment Plan Summary: Daily contact with patient to assess and evaluate symptoms and progress in treatment, Medication management, Plan inpatient treatment  and medications as below Encourage increased group and milieu participation to work on coping skills and symptom reduction Continue Remeron 15 mgrs QHS for depression Continue Vistaril 25 mgrs Q 6 hours PRN for anxiety We discussed possible antipsychotic medication management for paranoia, mood/ antidepressant  Augmentation- not interested at this time, states she does not feel she needs this type of medication Treatment team working on disposition planning options Neita Garnet, MD 06/04/2016, 4:22 PM

## 2016-06-04 NOTE — Progress Notes (Signed)
D: Patient remains flat and guarded.  She reveals little to staff except that she's "feeling ok."  Patient is focused on discharge and getting back to her job.  She rates her depression, hopelessness and anxiety as a 0.  She denies any thoughts of self harm.  She is visible on the unit and is interacting with her peers.  She reports she is sleeping well and her appetite is good.  Reports her energy level and concentration are good.  She denies any physical pain and appears to be in no distress.  Her mood is stable; she is calm and cooperative. A: Continue to monitor medication management and MD orders.  Safety checks completed every 15 minutes per protocol.  Offer support and encouragement as needed. R: Patient remains minimal with staff; she is guarded and withdrawn.  She tends to isolate at times.

## 2016-06-04 NOTE — Progress Notes (Signed)
D: Patient has been visible in the milieu today.  She remains guarded and minimal, however, she appears with brighter affect today.  Patient has been complaining of "blurred vision."  Ordered eye drops per NP and will administer for dry eyes.  Patient denies any depressive symptoms or thoughts of self harm.  Patient has been in the day room interacting with her peers.  Her goal today is "to get back to work."  Patient is focused on discharge.   A: Continue to monitor medication management and MD orders.  Safety checks completed every 15 minutes per protocol.  Offer support and encouragement as needed. R: Patient is receptive to staff; her behavior is appropriate.

## 2016-06-04 NOTE — Progress Notes (Signed)
Recreation Therapy Notes  Date: 06/04/16 Time: 0930 Location: 300 Hall Dayroom  Group Topic: Stress Management  Goal Area(s) Addresses:  Patient will verbalize importance of using healthy stress management.  Patient will identify positive emotions associated with healthy stress management.   Intervention: Calm App  Activity :  Letting Go Meditation.  LRT introduced the stress management technique of meditation.  LRT played the meditation to allow patients to fully engage in the meditation.  Patients were to follow along as meditation played.   Education:  Stress Management, Discharge Planning.   Education Outcome: Acknowledges edcuation/In group clarification offered/Needs additional education  Clinical Observations/Feedback: Pt did not attend group.   Prapti Grussing, LRT/CTRS         Hendrix Console A 06/04/2016 12:40 PM 

## 2016-06-05 DIAGNOSIS — T434X2A Poisoning by butyrophenone and thiothixene neuroleptics, intentional self-harm, initial encounter: Secondary | ICD-10-CM | POA: Diagnosis not present

## 2016-06-05 DIAGNOSIS — Z818 Family history of other mental and behavioral disorders: Secondary | ICD-10-CM | POA: Diagnosis not present

## 2016-06-05 DIAGNOSIS — T50901A Poisoning by unspecified drugs, medicaments and biological substances, accidental (unintentional), initial encounter: Secondary | ICD-10-CM

## 2016-06-05 DIAGNOSIS — T43022A Poisoning by tetracyclic antidepressants, intentional self-harm, initial encounter: Secondary | ICD-10-CM | POA: Diagnosis not present

## 2016-06-05 DIAGNOSIS — T1491XA Suicide attempt, initial encounter: Secondary | ICD-10-CM | POA: Diagnosis not present

## 2016-06-05 MED ORDER — MIRTAZAPINE 15 MG PO TABS: 15.0000 mg | ORAL_TABLET | Freq: Every day | ORAL | 0 refills | Status: DC

## 2016-06-05 MED ORDER — HYDROXYZINE HCL 25 MG PO TABS: 25.0000 mg | ORAL_TABLET | Freq: Four times a day (QID) | ORAL | 0 refills | Status: DC | PRN

## 2016-06-05 NOTE — Progress Notes (Signed)
Discharge Note:  Patient discharged home with family member.  Patient denied SI and HI.  Denied A/V hallucinations.  Suicide prevention information given and discussed with patient who stated she understood and had no questions.  Patient stated she received all her belongings, clothing, misc items, toiletries, medications.  [facility].  All required discharge information given at discharge.

## 2016-06-05 NOTE — [facility] Suicide Risk Assessment (Signed)
[facility]   Principal Problem:  Overdose /undetermined intent Discharge Diagnoses:  Patient Active Problem List   Diagnosis Date Noted  . Bipolar 1 disorder (HCC) [F31.9] 06/02/2016  . Bipolar affective disorder, current episode depressed (HCC) [F31.30] 06/02/2016  . OVARIAN CYST, RIGHT [N83.209] 06/30/2008  . ECZEMA, ATOPIC DERMATITIS [L20.89] 08/20/2006    Total Time spent with patient: 30 minutes  Musculoskeletal: Strength & Muscle Tone: within normal limits Gait & Station: normal Patient leans: N/A  Psychiatric Specialty Exam: ROS  Blood pressure 109/66, pulse (!) 103, temperature 97.8 F (36.6 C), temperature source Oral, resp. rate 18, height 5\' 3"  (1.6 m), weight 148 lb (67.1 kg), last menstrual period 05/23/2016, SpO2 100 %.Body mass index is 26.22 kg/m.  General Appearance: Well Groomed  Eye Contact::  Good  Speech:  Normal Rate409  Volume:  Normal  Mood:  improved mood, states she is feeling "OK"  Affect:  Appropriate  Thought Process:  Linear  Orientation:  Full (Time, Place, and Person)  Thought Content:  denies any hallucinations, no delusions   Suicidal Thoughts:  No denies any suicidal or self injurious ideations, denies any homicidal or violent ideations  Homicidal Thoughts:  No  Memory:  recent and remote grossly intact   Judgement:  improved  Insight:  improving  Psychomotor Activity:  Normal  Concentration:  Good  Recall:  good  Fund of Knowledge:Good  Language: Good  Akathisia:  Negative  Handed:  Right  AIMS (if indicated):     Assets:  Desire for Improvement Resilience  Sleep:  Number of Hours: 6.75  Cognition: WNL  ADL's:  Intact   Mental Status Per Nursing Assessment::   On Admission:  NA  Demographic Factors:  20 year old single female , no children, employed, lives with roommate  Loss Factors: Relationship stressors   Historical Factors: Has been diagnosed with Schizoaffective Disorder in the past, no  prior psychiatric admissions   Risk Reduction Factors:   Sense of responsibility to family, Employed, Living with another person, especially a relative and Positive coping skills or problem solving skills  Continued Clinical Symptoms:  Alert, attentive, well related, pleasant , good eye contact, mood is improved compared to admission and at this time presents euthymic, with full range of affect, no thought disorder, no suicidal ideations, no homicidal ideations, no psychotic symptoms, future oriented .  Behavior on unit calm and in good control  Cognitive Features That Contribute To Risk:  No gross cognitive deficits noted upon discharge. Is alert , attentive, and oriented x 3   Suicide Risk:  Mild:  Suicidal ideation of limited frequency, intensity, duration, and specificity.  There are no identifiable plans, no associated intent, mild dysphoria and related symptoms, good self-control (both objective and subjective assessment), few other risk factors, and identifiable protective factors, including available and accessible social support.  Follow-up Information    [facility] Follow up.   Specialty:  Behavioral Health Why:  Please go to walk-in clinic within 7 days of discharge Monday-Friday at 8am for hospital discharge appt. Please take your hospital discharge paperwork. Contact information: [address] [place] [address] [phone]           Plan Of Care/Follow-up recommendations:  Activity:  as tolerated Diet:  Regular Tests:  NA Other:  See below Patient is leaving unit in good spirits  Plans to return home Follow up as above  Nehemiah MassedOBOS, FERNANDO, MD 06/05/2016, 11:42 AM

## 2016-06-05 NOTE — Discharge Summary (Signed)
Physician Discharge Summary Note  Patient:  Deanna [name] is an 20 y.o., female MRN:  [id number] DOB:  03-Dec-1995 Patient phone:  [phone] (home)  Patient address:   [address] [place] [address],  Total Time spent with patient: 30 minutes  Date of Admission:  06/02/2016 Date of Discharge: 06/05/2016  Reason for Admission:  Overdose on prescription meds  Principal Problem: Bipolar affective disorder, current episode depressed [facility]) Discharge Diagnoses: Patient Active Problem List   Diagnosis Date Noted  . Bipolar affective disorder, current episode depressed (HCC) [F31.30] 06/02/2016    Priority: High  . Drug overdose [T50.901A]   . Bipolar 1 disorder (HCC) [F31.9] 06/02/2016  . OVARIAN CYST, RIGHT [N83.209] 06/30/2008  . ECZEMA, ATOPIC DERMATITIS [L20.89] 08/20/2006    Past Psychiatric History: see HPI  Past Medical History:  Past Medical History:  Diagnosis Date  . Allergy   . Anxiety   . Asthma   . Depression   . Eczema   . Eczema   . Ovarian cyst    resolved    Past Surgical History:  Procedure Laterality Date  . OVARIAN CYST REMOVAL     Family History:  Family History  Problem Relation Age of Onset  . Asthma Mother   . Bipolar disorder Mother   . Asthma Father   . Hypertension Father   . Bipolar disorder Father   . Hypertension Paternal Grandmother   . Diabetes Paternal Grandmother   . Asthma Paternal Grandmother    Family Psychiatric  History: see HPI Social History:  History  Alcohol Use No     History  Drug Use No    Social History   Social History  . Marital status: Single    Spouse name: N/A  . Number of children: N/A  . Years of education: N/A   Social History Main Topics  . Smoking status: Current Every Day Smoker    Packs/day: 1.00    Years: 5.00    Types: Cigarettes  . Smokeless tobacco: Never Used  . Alcohol use No  . Drug use: No  . Sexual activity: Yes    Partners: Female    Birth control/ protection: None    Other Topics Concern  . None   Social History Narrative  . None    Hospital Course:  Deanna [name], 20 year old female, reports she overdosed on prescribed medications ( Remeron and Haloperidol).  As per ED notes, [facility] stated patient had made suicidal statements in the context of argument with SO. Patient states she has been diagnosed with Schizoaffective Disorder in the past, but at this time denies any clear history of mania or of depressive episodes.  Deanna [name] was admitted for Bipolar affective disorder, current episode depressed (HCC) and crisis management.  Patient was treated with medications with their indications listed below in detail under Medication List.  Medical problems were identified and treated as needed.  Home medications were restarted as appropriate.  Improvement was monitored by observation and Deanna [name] daily report of symptom reduction.  Emotional and mental status was monitored by daily self inventory reports completed by Deanna [name] and clinical staff.  Patient reported continued improvement, denied any new concerns.  Patient had been compliant on medications and denied side effects.  Support and encouragement was provided.        Deanna [name] was evaluated by the treatment team for stability and plans for continued recovery upon discharge.  Patient was offered further treatment options  upon discharge including Residential, Intensive Outpatient and Outpatient treatment. Patient will follow up with agency listed below for medication management and counseling.  Encouraged patient to maintain satisfactory support network and home environment.  Advised to adhere to medication compliance and outpatient treatment follow up.  Prescriptions provided.       Deanna [name] motivation was an integral factor for scheduling further treatment.  Employment, transportation, bed availability, health status, family support, and any pending legal issues were also  considered during patient's hospital stay.  Upon completion of this admission the patient was both mentally and medically stable for discharge denying suicidal/homicidal ideation, auditory/visual/tactile hallucinations, delusional thoughts and paranoia.      Physical Findings: AIMS: Facial and Oral Movements Muscles of Facial Expression: None, normal Lips and Perioral Area: None, normal Jaw: None, normal Tongue: None, normal,Extremity Movements Upper (arms, wrists, hands, fingers): None, normal Lower (legs, knees, ankles, toes): None, normal, Trunk Movements Neck, shoulders, hips: None, normal, Overall Severity Severity of abnormal movements (highest score from questions above): None, normal Incapacitation due to abnormal movements: None, normal Patient's awareness of abnormal movements (rate only patient's report): No Awareness, Dental Status Current problems with teeth and/or dentures?: No Does patient usually wear dentures?: No  CIWA:  CIWA-Ar Total: 1 COWS:  COWS Total Score: 1  Musculoskeletal: Strength & Muscle Tone: within normal limits Gait & Station: normal, shuffle Patient leans: N/A  Psychiatric Specialty Exam: Physical Exam  Nursing note and vitals reviewed.   Review of Systems  Constitutional: Negative.   HENT: Negative.   Eyes: Negative.   Respiratory: Negative.   Cardiovascular: Negative.   Gastrointestinal: Negative.   Genitourinary: Negative.   Musculoskeletal: Negative.   Skin: Negative.   Neurological: Negative.  Negative for dizziness.  Endo/Heme/Allergies: Negative.   Psychiatric/Behavioral: Negative.   All other systems reviewed and are negative.   Blood pressure 103/62, pulse 79, temperature 97.8 F (36.6 C), temperature source Oral, resp. rate 18, height 5\' 3"  (1.6 m), weight 67.1 kg (148 lb), last menstrual period 05/23/2016, SpO2 100 %.Body mass index is 26.22 kg/m.    Have you used any form of tobacco in the last 30 days? (Cigarettes,  Smokeless Tobacco, Cigars, and/or Pipes): Yes  Has this patient used any form of tobacco in the last 30 days? (Cigarettes, Smokeless Tobacco, Cigars, and/or Pipes) Yes, N/A  Blood Alcohol level:  Lab Results  Component Value Date   ETH <5 06/01/2016    Metabolic Disorder Labs:  No results found for: HGBA1C, MPG No results found for: PROLACTIN No results found for: CHOL, TRIG, HDL, CHOLHDL, VLDL, LDLCALC  See Psychiatric Specialty Exam and Suicide Risk Assessment completed by Attending Physician prior to discharge.  Discharge destination:  Home  Is patient on multiple antipsychotic therapies at discharge:  No   Has Patient had three or more failed trials of antipsychotic monotherapy by history:  No  Recommended Plan for Multiple Antipsychotic Therapies: NA    Medication List    STOP taking these medications   diphenhydrAMINE 25 MG tablet Commonly known as:  BENADRYL   famotidine 20 MG tablet Commonly known as:  PEPCID   haloperidol 2 MG tablet Commonly known as:  HALDOL   ibuprofen 600 MG tablet Commonly known as:  ADVIL,MOTRIN   predniSONE 10 MG tablet Commonly known as:  DELTASONE     TAKE these medications     Indication  hydrOXYzine 25 MG tablet Commonly known as:  ATARAX/VISTARIL Take 1 tablet (25 mg total) by mouth every  6 (six) hours as needed for anxiety. What changed:  when to take this  reasons to take this  Indication:  Anxiety Neurosis   mirtazapine 15 MG tablet Commonly known as:  REMERON Take 1 tablet (15 mg total) by mouth at bedtime. What changed:  medication strength  how much to take  Indication:  Major Depressive Disorder      Follow-up Information    [facility]   Specialty:  Behavioral Health Why:  Please go to walk-in clinic within 7 days of discharge Monday-Friday at 8am for hospital discharge appt. Please take your hospital discharge paperwork. Contact informationElpidio Eric: [address] [place]  [id number] [phone]           Follow-up recommendations:  Activity:  as tol Diet:  as tol  Comments:  1.  Take all your medications as prescribed.   2.  Report any adverse side effects to outpatient provider. 3.  Patient instructed to not use alcohol or illegal drugs while on prescription medicines. 4.  In the event of worsening symptoms, instructed patient to call 911, the crisis hotline or go to nearest emergency room for evaluation of symptoms.  Signed: Lindwood QuaSheila May Agustin, NP [facility] 06/05/2016, 2:53 PM   Patient seen, Suicide Assessment Completed.  Disposition Plan Reviewed

## 2016-06-05 NOTE — Progress Notes (Signed)
  [facility] :  Will you be returning to the same living situation after discharge:  Yes,  Pt returning home At discharge, do you have transportation home?: Yes,  grandmother to pick up Do you have the ability to pay for your medications: Yes,  Pt provided with samples and prescriptions  Release of information consent forms completed and in the chart;  Patient's signature needed at discharge.  Patient to Follow up at: Follow-up Information    [facility].   Specialty:  Behavioral Health Why:  Please go to walk-in clinic within 7 days of discharge Monday-Friday at 8am for hospital discharge appt. Please take your hospital discharge paperwork. Contact information: [address] [place] [address] [phone]           Next level of care provider has access to [facility] Link:no  Safety Planning and Suicide Prevention discussed: Yes,  with Pt; declined family contact  Have you used any form of tobacco in the last 30 days? (Cigarettes, Smokeless Tobacco, Cigars, and/or Pipes): Yes  Has patient been referred to the Quitline?: Patient refused referral  Patient has been referred for addiction treatment: Yes  Verdene LennertLauren C Mischelle Reeg 06/05/2016, 10:45 AM

## 2016-06-05 NOTE — Progress Notes (Signed)
Patient denied SI and HI, contracts for safety.  Denied A/V hallucinations.  Denied pain.  Emotional support and encouragement given patient.  Safety maintained with 15 minute checks. Looking forward to discharge today.

## 2016-06-05 NOTE — [facility] Group Notes (Signed)
Patient attend group. Her day was a 10. Pt said she get to go home tomorrow. Pt states  She's  I ready to go home.

## 2016-06-05 NOTE — [facility] Group Notes (Signed)

## 2016-07-01 ENCOUNTER — Emergency Department (HOSPITAL_COMMUNITY)
Admission: EM | Admit: 2016-07-01 | Discharge: 2016-07-02 | Disposition: A | Discharge status: Home / Self Care | Payer: Medicaid Other | Attending: Emergency Medicine | Admitting: Emergency Medicine

## 2016-07-01 ENCOUNTER — Encounter (HOSPITAL_COMMUNITY): Payer: Self-pay

## 2016-07-01 DIAGNOSIS — L0390 Cellulitis, unspecified: Secondary | ICD-10-CM

## 2016-07-01 DIAGNOSIS — J45909 Unspecified asthma, uncomplicated: Secondary | ICD-10-CM | POA: Insufficient documentation

## 2016-07-01 DIAGNOSIS — F17210 Nicotine dependence, cigarettes, uncomplicated: Secondary | ICD-10-CM | POA: Insufficient documentation

## 2016-07-01 DIAGNOSIS — L03818 Cellulitis of other sites: Secondary | ICD-10-CM | POA: Insufficient documentation

## 2016-07-01 DIAGNOSIS — L309 Dermatitis, unspecified: Secondary | ICD-10-CM | POA: Insufficient documentation

## 2016-07-01 NOTE — ED Triage Notes (Signed)
Pt has complaint of rash getting beginning today. Pt has eczema and states "it could be just flaring up" Airway intact and breath sounds clear. Pt denies coming in contact with anything she's allergic too. VSS.

## 2016-07-02 MED ORDER — CLINDAMYCIN HCL 150 MG PO CAPS: 300.0000 mg | ORAL_CAPSULE | Freq: Four times a day (QID) | ORAL | 0 refills | Status: AC

## 2016-07-02 MED ORDER — METHYLPREDNISOLONE 4 MG PO TBPK: ORAL_TABLET | ORAL | 0 refills | Status: DC

## 2016-07-02 MED ORDER — DIPHENHYDRAMINE HCL 25 MG PO CAPS
25.0000 mg | ORAL_CAPSULE | Freq: Once | ORAL | Status: AC
  Administered 2016-07-02: 25 mg via ORAL
  Filled 2016-07-02: qty 1

## 2016-07-02 NOTE — ED Notes (Signed)
ED Provider at bedside. 

## 2016-07-02 NOTE — Discharge Instructions (Signed)
Please take clindamycin 2 capsules by mouth 4 times daily for 7 days. It is very important that you take Medrol Dosepak as prescribed and taper over 6 days. Continue treatment given to by dermatologist. Keep areas of rash hydrated Vaseline. Follow up with primary care physician or [facility] [place] [facility] in 2-5 days if symptoms worsen.  Contact a health care provider if: Your itching interferes with sleep. Your rash gets worse or is not better within one week of starting treatment. You have a fever. You have a rash flare-up after having contact with someone who has cold sores or fever blisters. Get help right away if: You develop pus or soft yellow scabs in the rash area. Your symptoms get worse. You feel very sleepy. You develop vomiting or diarrhea that persists. You notice red streaks coming from the infected area. Your red area gets larger or turns dark in color.

## 2016-07-02 NOTE — ED Provider Notes (Signed)
[facility]-EM[facility]Y DEPT Provider Note   CSN: [id number] Arrival date & time: 07/01/16  2146     History   Chief Complaint Chief Complaint  Patient presents with  . Rash  . Eczema    HPI Deanna [name] is a 21 y.o. female with history of eczema presents with worsening rash throughout body for 6 months. Patient states that she has had this itching that has worsened since July. Patient denies any pain at this time but does complain of her itching. Patient states that she has seen a dermatologist about 6 weeks ago where she was given treatment for eczema including triamcinolone, a steroid shot and Benadryl. Patient states that these things did not really help her symptoms very much. 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 She states that Vaseline mildly relieves her symptoms. She also states that Benadryl mildly relieves her symptoms especially at night. She states that her itching is so bad that she thinks she causes more damage from her scratching. Patient reports leaving prison in July stating that she did not have any symptoms like this in prison and since she left has been having worsening rash. She states that she changed her detergent to sensitive skin, soap is for sensitive skin, creams are all for sensitive skin. Patient denies any new medications besides one given by her dermatologist. Patient denies any recent history of STDs. Patient denies fevers, chills, nausea, vomiting, chest pain, shortness of breath, throat closing, trouble breathing, and no recent travel.  HPI  Past Medical History:  Diagnosis Date  . Allergy   . Anxiety   . Asthma   . Depression   . Eczema   . Eczema   . Ovarian cyst    resolved    Patient Active Problem List   Diagnosis Date Noted  . Drug overdose   . Bipolar 1 disorder (HCC) 06/02/2016  . Bipolar  affective disorder, current episode depressed (HCC) 06/02/2016  . OVARIAN CYST, RIGHT 06/30/2008  . ECZEMA, ATOPIC DERMATITIS 08/20/2006    Past Surgical History:  Procedure Laterality Date  . OVARIAN CYST REMOVAL      OB History    No data available       Home Medications    Prior to Admission medications   Medication Sig Start Date End Date Taking? Authorizing Provider  clindamycin (CLEOCIN) 150 MG capsule Take 2 capsules (300 mg total) by mouth 4 (four) times daily. 07/02/16 07/09/16  Kyjuan Gause Manuel Aaisha Sliter, [place]  hydrOXYzine (ATARAX/VISTARIL) 25 MG tablet Take 1 tablet (25 mg total) by mouth every 6 (six) hours as needed for anxiety. 06/05/16   Adonis Brook, NP  methylPREDNISolone (MEDROL DOSEPAK) 4 MG TBPK tablet Taper over 6 days 07/02/16   [facility] [place], [place]  mirtazapine (REMERON) 15 MG tablet Take 1 tablet (15 mg total) by mouth at bedtime. 06/05/16   Adonis Brook, NP    Family History Family History  Problem Relation Age of Onset  . Asthma Mother   . Bipolar disorder Mother   . Asthma Father   . Hypertension Father   . Bipolar disorder Father   . Hypertension Paternal Grandmother   . Diabetes Paternal Grandmother   . Asthma Paternal Grandmother     Social History Social History  Substance Use Topics  . Smoking status: Current Every Day Smoker  Packs/day: 1.00    Years: 5.00    Types: Cigarettes  . Smokeless tobacco: Never Used  . Alcohol use No     Allergies   Dairy aid [lactase] and Penicillins   Review of Systems Review of Systems  Constitutional: Negative for chills and fever.  HENT: Negative for facial swelling, trouble swallowing and voice change.   Respiratory: Negative for shortness of breath.   Cardiovascular: Negative for chest pain.  Gastrointestinal: Negative for abdominal pain, diarrhea, nausea and vomiting.  Musculoskeletal: Negative for arthralgias, gait problem, joint swelling, neck pain and neck stiffness.  Skin:  Positive for rash and wound (Over rash- Likely due to itching).  Neurological: Negative for speech difficulty, weakness and numbness.     Physical Exam Updated Vital Signs BP 115/77 (BP Location: Right Arm)   Pulse 70   Temp 98.5 F (36.9 C) (Oral)   Resp 18   LMP 06/30/2016 (Exact Date)   SpO2 100%   Physical Exam  Constitutional: She is oriented to person, place, and time. She appears well-developed and well-nourished.  HENT:  Head: Normocephalic and atraumatic.  Nose: Nose normal.  Mouth/Throat: Oropharynx is clear and moist.  Eyes: Conjunctivae and EOM are normal. Pupils are equal, round, and reactive to light.  Neck: Normal range of motion. Neck supple.  Cardiovascular: Normal rate and regular rhythm.   Pulmonary/Chest: Effort normal.  Neurological: She is alert and oriented to person, place, and time.  Skin: Skin is warm. Capillary refill takes less than 2 seconds. Rash noted.  Dry, scaly rash noted throughout body. This includes upper extremities, lower extremities and torso. Area is noted where may have break in skin from excessive scratching and itching. No visible discharge in any site, or significant surrounding erythema.   Psychiatric: She has a normal mood and affect. Her behavior is normal.     ED Treatments / Results  Labs (all labs ordered are listed, but only abnormal results are displayed) Labs Reviewed - No data to display  EKG  EKG Interpretation None       Radiology No results found.  Procedures Procedures (including critical care time)  Medications Ordered in ED Medications  diphenhydrAMINE (BENADRYL) capsule 25 mg (25 mg Oral Given 07/02/16 0110)     Initial Impression / Assessment and Plan / ED Course  I have reviewed the triage vital signs and the nursing notes.  Pertinent labs & imaging results that were available during my care of the patient were reviewed by me and considered in my medical decision making (see chart for  details).  Clinical Course   Presents with Hx of  asthma, allergies and persistent, waxing and waning rash in the flexural folds for many years.  Hx and PE consistent with Eczema.  Pt instructed to continue medications prescribed to her by her dermatologist, which include antihistamine and triamcinolone. Encouraged to avoid hot baths/showers, continue using Aquaphor after every bath/shower and preferably BID, drink plenty of fluids.  Pt given Rx for Medrol Dosepak for exacerbation of symptoms and clindamycin for any possible cellulitis secondary to her scratching. Patient is afebrile, vital signs stable, and no apparent distress. Patient encouraged to f/u with PCP and I have also discussed reasons to return immediately to the ER.  Patient expresses understanding and agrees with plan.  Final Clinical Impressions(s) / ED Diagnoses   Final diagnoses:  Eczema, unspecified type  Cellulitis, unspecified cellulitis site    New Prescriptions Discharge Medication List as of 07/02/2016  1:49 AM  START taking these medications   Details  clindamycin (CLEOCIN) 150 MG capsule Take 2 capsules (300 mg total) by mouth 4 (four) times daily., Starting Wed 07/02/2016, Until Wed 07/09/2016, Print    methylPREDNISolone (MEDROL DOSEPAK) 4 MG TBPK tablet Taper over 6 days, [address] [place], [place] 07/02/16 1610    Arby Barrette, MD 07/03/16 1231

## 2016-07-16 ENCOUNTER — Emergency Department (HOSPITAL_COMMUNITY)
Admission: EM | Admit: 2016-07-16 | Discharge: 2016-07-16 | Disposition: A | Discharge status: Home / Self Care | Payer: Medicaid Other | Attending: Emergency Medicine | Admitting: Emergency Medicine

## 2016-07-16 ENCOUNTER — Encounter (HOSPITAL_COMMUNITY): Payer: Self-pay | Admitting: Emergency Medicine

## 2016-07-16 DIAGNOSIS — F17210 Nicotine dependence, cigarettes, uncomplicated: Secondary | ICD-10-CM | POA: Insufficient documentation

## 2016-07-16 DIAGNOSIS — J45909 Unspecified asthma, uncomplicated: Secondary | ICD-10-CM | POA: Insufficient documentation

## 2016-07-16 DIAGNOSIS — L309 Dermatitis, unspecified: Secondary | ICD-10-CM

## 2016-07-16 DIAGNOSIS — L0100 Impetigo, unspecified: Secondary | ICD-10-CM | POA: Insufficient documentation

## 2016-07-16 MED ORDER — CLOBETASOL PROPIONATE 0.05 % EX CREA: 1.0000 "application " | TOPICAL_CREAM | Freq: Two times a day (BID) | CUTANEOUS | 0 refills | Status: DC

## 2016-07-16 MED ORDER — CEPHALEXIN 500 MG PO CAPS: 500.0000 mg | ORAL_CAPSULE | Freq: Four times a day (QID) | ORAL | 0 refills | Status: DC

## 2016-07-16 MED ORDER — HYDROCORTISONE 1 % EX CREA: TOPICAL_CREAM | CUTANEOUS | 0 refills | Status: DC

## 2016-07-16 NOTE — ED Triage Notes (Signed)
Patient reports itchy dry skin rashes at neck and upper lip onset his week , respirations unlabored / no oral swelling .

## 2016-07-16 NOTE — Discharge Instructions (Signed)
To find a primary care or specialty doctor please call [phone] or [phone] to access "[facility] Find a Doctor Service."  You may also go on the [facility] website at [email or web address]  There are also multiple [facility], [facility], [facility] and [facility] practices throughout [place] that are frequently accepting new patients. You may find a clinic that is close to your home and contact them.  [facility] -  [address] [place] [id number] [phone]   [facility] -  [address]  [place] [address] [phone]   [facility] - [address] [place] [address]  [place] [id number] [phone]    [facility]:   [facility]  [address] [place]  [phone]   Dr. Mertha FindersJohn H. Hall Jr, MD  [address]  [phone]  [facility]  [address]  [phone]   [facility]  [address]  [phone]  Janalyn Harderafeen Stuart MD  [address]  [phone]  [facility] SauerMccoy Bruce  [address]  [phone]  [place] Amy Y MD  [address]  [phone]  [facility]  [address]  [phone]

## 2016-07-16 NOTE — ED Provider Notes (Signed)
TIME SEEN: 5:25 AM  CHIEF COMPLAINT: "I have a rash around the mouth"  HPI: Patient is a 21 year old female with history of allergies, asthma and eczema who presents to the emergency department with a rash around her mouth. The past several days. Reports it has been draining yellow drainage and crusting. No fevers or chills. No rash on her palms, soles or inside of her mouth. States it started off a small bumps around her lips similar to her eczema but has spread and now looks different. No new soaps, lotions or detergents. Does not have a PCP or dermatologist.  ROS: See HPI Constitutional: no fever  Eyes: no drainage  ENT: no runny nose   Cardiovascular:  no chest pain  Resp: no SOB  GI: no vomiting GU: no dysuria Integumentary: no rash  Allergy: no hives  Musculoskeletal: no leg swelling  Neurological: no slurred speech ROS otherwise negative  PAST MEDICAL HISTORY/PAST SURGICAL HISTORY:  Past Medical History:  Diagnosis Date  . Allergy   . Anxiety   . Asthma   . Depression   . Eczema   . Eczema   . Ovarian cyst    resolved    MEDICATIONS:  Prior to Admission medications   Medication Sig Start Date End Date Taking? Authorizing Provider  hydrOXYzine (ATARAX/VISTARIL) 25 MG tablet Take 1 tablet (25 mg total) by mouth every 6 (six) hours as needed for anxiety. 06/05/16   Adonis Brook, NP  methylPREDNISolone (MEDROL DOSEPAK) 4 MG TBPK tablet Taper over 6 days 07/02/16   [facility] [place], [place]  mirtazapine (REMERON) 15 MG tablet Take 1 tablet (15 mg total) by mouth at bedtime. 06/05/16   Adonis Brook, NP    ALLERGIES:  Allergies  Allergen Reactions  . Dairy Aid [employer]       . Penicillins Rash    .Marland KitchenHas patient had a PCN reaction causing immediate rash, facial/tongue/throat swelling, SOB or lightheadedness with hypotension: No Has patient had a PCN reaction causing severe rash involving mucus membranes or skin necrosis: No Has patient had a PCN reaction  that required hospitalization No Has patient had a PCN reaction occurring within the last 10 years: No If all of the above answers are "NO", then may proceed with Cephalosporin use.     SOCIAL HISTORY:  Social History  Substance Use Topics  . Smoking status: Current Every Day Smoker    Packs/day: 1.00    Years: 5.00    Types: Cigarettes  . Smokeless tobacco: Never Used  . Alcohol use No    FAMILY HISTORY: Family History  Problem Relation Age of Onset  . Asthma Mother   . Bipolar disorder Mother   . Asthma Father   . Hypertension Father   . Bipolar disorder Father   . Hypertension Paternal Grandmother   . Diabetes Paternal Grandmother   . Asthma Paternal Grandmother     EXAM: BP 112/69   Pulse 72   Temp 98 F (36.7 C)   Resp 18   LMP 06/30/2016 (Approximate)   SpO2 100%  CONSTITUTIONAL: Alert and oriented and responds appropriately to questions. Well-appearing; well-nourished HEAD: Normocephalic EYES: Conjunctivae clear, PERRL, EOMI ENT: normal nose; no rhinorrhea; moist mucous membranes; eczematous -like lesions around patient's mouth with superimposed signs of infection. It appears to be impetigo with crusting and yellow drainage. No significant erythema, swelling or induration of the face. No Ludwig's angina. No trismus or drooling. Normal phonation. No dental caries or dental pain on exam. Tongue sits flat  in the bottom of the mouth. No rash on her mucous membranes. Appears well-hydrated. NECK: Supple, no meningismus, no nuchal rigidity, reactive anterior cervical lymphadenopathy appreciated CARD: RRR; S1 and S2 appreciated; no murmurs, no clicks, no rubs, no gallops RESP: Normal chest excursion without splinting or tachypnea; breath sounds clear and equal bilaterally; no wheezes, no rhonchi, no rales, no hypoxia or respiratory distress, speaking full sentences ABD/GI: Normal bowel sounds; non-distended; soft, non-tender, no rebound, no guarding, no peritoneal signs,  no hepatosplenomegaly BACK:  The back appears normal and is non-tender to palpation, there is no CVA tenderness EXT: Normal ROM in all joints; non-tender to palpation; no edema; normal capillary refill; no cyanosis, no calf tenderness or swelling    SKIN: Normal color for age and race; warm; scattered eczematous lesions to her extremities. No rash on her palms or mucous membranes. No petechiae or purpura. No blisters or desquamation. No hives. NEURO: Moves all extremities equally, sensation to light touch intact diffusely, cranial nerves II through XII intact, normal speech PSYCH: The patient's mood and manner are appropriate. Grooming and personal hygiene are appropriate.  MEDICAL DECISION MAKING: Patient here with eczema. Has been seen many times in the emergency department for the same. Has multiple eczematous lesions without signs of superimposed infection to her extremities. We will have her start clobetasol cream for this and follow-up with a dermatologist. I do not feel oral steroids are indicated.    Patient appears to have eczematous lesions around her mouth with superimposed infection. It looks like impetigo. We'll start her on Keflex. Have advised her not to put any steroids on her face until this infection has completely resolved and then she can put hydrocortisone once a day for just one week. Have recommended close outpatient follow-up with a PCP and dermatologist. Discussed return cautions with her. Recommended alternating Tylenol and Motrin for pain. No signs of a life-threatening rash on exam.   At this time, I do not feel there is any life-threatening condition present. I have reviewed and discussed all results (EKG, imaging, lab, urine as appropriate) and exam findings with patient/family. I have reviewed nursing notes and appropriate previous records.  I feel the patient is safe to be discharged home without further emergent workup and can continue workup as an outpatient as needed.  Discussed usual and customary return precautions. Patient/family verbalize understanding and are comfortable with this plan.  Outpatient follow-up has been provided. All questions have been answered.     Layla MawKristen N Ashlyne Olenick, DO 07/16/16 [phone]

## 2016-08-19 ENCOUNTER — Emergency Department (HOSPITAL_COMMUNITY)
Admission: EM | Admit: 2016-08-19 | Discharge: 2016-08-19 | Disposition: A | Discharge status: Home / Self Care | Payer: Medicaid Other | Attending: Emergency Medicine | Admitting: Emergency Medicine

## 2016-08-19 ENCOUNTER — Encounter (HOSPITAL_COMMUNITY): Payer: Self-pay | Admitting: Emergency Medicine

## 2016-08-19 DIAGNOSIS — L0291 Cutaneous abscess, unspecified: Secondary | ICD-10-CM

## 2016-08-19 DIAGNOSIS — L02416 Cutaneous abscess of left lower limb: Secondary | ICD-10-CM | POA: Insufficient documentation

## 2016-08-19 DIAGNOSIS — J45909 Unspecified asthma, uncomplicated: Secondary | ICD-10-CM | POA: Insufficient documentation

## 2016-08-19 DIAGNOSIS — F17210 Nicotine dependence, cigarettes, uncomplicated: Secondary | ICD-10-CM | POA: Insufficient documentation

## 2016-08-19 DIAGNOSIS — L309 Dermatitis, unspecified: Secondary | ICD-10-CM

## 2016-08-19 MED ORDER — TRIAMCINOLONE ACETONIDE 0.1 % EX CREA: 1.0000 "application " | TOPICAL_CREAM | Freq: Two times a day (BID) | CUTANEOUS | 0 refills | Status: DC

## 2016-08-19 MED ORDER — LIDOCAINE-EPINEPHRINE (PF) 2 %-1:200000 IJ SOLN
10.0000 mL | Freq: Once | INTRAMUSCULAR | Status: AC
  Administered 2016-08-19: 10 mL
  Filled 2016-08-19: qty 20

## 2016-08-19 MED ORDER — SODIUM BICARBONATE 4 % IV SOLN
5.0000 mL | Freq: Once | INTRAVENOUS | Status: AC
  Administered 2016-08-19: 5 mL via SUBCUTANEOUS
  Filled 2016-08-19: qty 5

## 2016-08-19 MED ORDER — CLINDAMYCIN HCL 150 MG PO CAPS: 450.0000 mg | ORAL_CAPSULE | Freq: Three times a day (TID) | ORAL | 0 refills | Status: AC

## 2016-08-19 NOTE — ED Provider Notes (Signed)
[facility]-EMERGENCY DEPT Provider Note   CSN: [id number] Arrival date & time: 08/19/16  1400   By signing my name below, I, Freida Busmaniana Omoyeni, attest that this documentation has been prepared under the direction and in the presence of Laroy Mustard, [place]. Electronically Signed: Freida Busmaniana Omoyeni, Scribe. 08/19/2016. 3:22 PM.  History   Chief Complaint Chief Complaint  Patient presents with  . Abscess   The history is provided by the patient. No language interpreter was used.     HPI Comments:  Deanna [name] is a 21 y.o. female who presents to the Emergency Department complaining of "boils" on her BLE x 4 days. She reports associated throbbing pain to the site but notes the boil on the LLE is worse than the right. Pt rates her pain a 9/10.  She reports h/o similar symptoms while incarcerated. She sates she squeezed the site and mild yellow drainage came from it. She reports taking leftover antibiotics with no change. No fever, chills, abdominal pain, nausea, or vomiting.   She also endorses worsening eczema "due to the season change". She states the gets this often and is now having another flare up.   Past Medical History:  Diagnosis Date  . Allergy   . Anxiety   . Asthma   . Depression   . Eczema   . Eczema   . Ovarian cyst    resolved    Patient Active Problem List   Diagnosis Date Noted  . Drug overdose   . Bipolar 1 disorder (HCC) 06/02/2016  . Bipolar affective disorder, current episode depressed (HCC) 06/02/2016  . OVARIAN CYST, RIGHT 06/30/2008  . ECZEMA, ATOPIC DERMATITIS 08/20/2006    Past Surgical History:  Procedure Laterality Date  . OVARIAN CYST REMOVAL      OB History    No data available       Home Medications    Prior to Admission medications   Medication Sig Start Date End Date Taking? Authorizing Provider  clindamycin (CLEOCIN) 150 MG capsule Take 3 capsules (450 mg total) by mouth 3 (three) times daily. 08/19/16 08/24/16  Ignazio Kincaid Manuel Araceli Coufal, [place]    triamcinolone cream (KENALOG) 0.1 % Apply 1 application topically 2 (two) times daily. 08/19/16   Adams Hinch Orson AloeManuel Artis Beggs, [place]    Family History Family History  Problem Relation Age of Onset  . Asthma Mother   . Bipolar disorder Mother   . Asthma Father   . Hypertension Father   . Bipolar disorder Father   . Hypertension Paternal Grandmother   . Diabetes Paternal Grandmother   . Asthma Paternal Grandmother     Social History Social History  Substance Use Topics  . Smoking status: Current Every Day Smoker    Packs/day: 1.00    Years: 5.00    Types: Cigarettes  . Smokeless tobacco: Never Used  . Alcohol use No     Allergies   Dairy aid [lactase] and Penicillins   Review of Systems Review of Systems  Constitutional: Negative for chills and fever.  Respiratory: Negative for shortness of breath.   Cardiovascular: Negative for chest pain.  Gastrointestinal: Negative for abdominal pain, nausea and vomiting.  Skin: Positive for rash.   Physical Exam Updated Vital Signs BP 107/73 (BP Location: Right Arm)   Pulse 102   Temp 98.2 F (36.8 C) (Oral)   Resp 16   Ht 5\' 3"  (1.6 m)   Wt 65.8 kg   LMP 07/09/2016 (Exact Date)   SpO2 100%   BMI 25.69 kg/m  Physical Exam  Constitutional: She is oriented to person, place, and time. She appears well-developed and well-nourished. No distress.  HENT:  Head: Normocephalic and atraumatic.  Eyes: Conjunctivae are normal.  Cardiovascular: Normal rate.   Pulmonary/Chest: Effort normal.  Abdominal: She exhibits no distension.  Neurological: She is alert and oriented to person, place, and time.  Skin: Skin is warm and dry.  4cm diameter of redness with pinpoint center with previous drainage on the LLE. Moderate tenderness to palpation RLE ~3cm diameter redness and mild TTP   Scaly rash over neck and arms. No vesicles, bullae, target lesions.   Psychiatric: She has a normal mood and affect.  Nursing note and vitals  reviewed.    ED Treatments / Results  DIAGNOSTIC STUDIES:  Oxygen Saturation is 100% on RA, normal by my interpretation.    COORDINATION OF CARE:  3:18 PM Discussed treatment plan with pt at bedside and pt agreed to plan.  Labs (all labs ordered are listed, but only abnormal results are displayed) Labs Reviewed - No data to display  EKG  EKG Interpretation None       Radiology No results found.  Procedures [place] bedside Date/Time: 08/19/2016 3:23 PM Performed by: Alvina Chou Authorized by: Alvina Chou  Consent: Verbal consent obtained. Written consent not obtained. Consent given by: patient Local anesthesia used: no  Anesthesia: Local anesthesia used: no  Sedation: Patient sedated: no Comments: Bedside US of LLE shows fluctuance consistent with abscess. Right lower leg lesion does not appear to be amenable for drainage.   Marland Kitchen.Incision and Drainage Date/Time: 08/19/2016 3:42 PM Performed by: Alvina Chou Authorized by: Alvina Chou   Consent:    Consent obtained:  Verbal   Consent given by:  Patient   Risks discussed:  Bleeding, incomplete drainage, infection, damage to other organs and pain   Alternatives discussed:  No treatment and delayed treatment Location:    Type:  Abscess   Size:  4 cm   Location:  Lower extremity   Lower extremity location:  Leg   Leg location:  L lower leg Pre-procedure details:    Skin preparation:  Betadine Anesthesia (see MAR for exact dosages):    Anesthesia method:  Local infiltration   Local anesthetic:  Lidocaine 2% WITH epi and sodium bicarbonate Procedure type:    Complexity:  Simple Procedure details:    Needle aspiration: no     Incision types:  Single straight   Incision depth:  Dermal   Scalpel blade:  11   Wound management:  Probed and deloculated and irrigated with saline   Drainage:  Bloody and purulent   Drainage amount:  Moderate   Wound treatment:  Wound  left open   Packing materials:  1/4 in gauze   Amount 1/4":  8 cm Post-procedure details:    Patient tolerance of procedure:  Tolerated well, no immediate complications   (including critical care time)    Medications Ordered in ED Medications  lidocaine-EPINEPHrine (XYLOCAINE W/EPI) 2 %-1:200000 (PF) injection 10 mL (10 mLs Infiltration Given 08/19/16 1618)  sodium bicarbonate (NEUT) 4 % injection 5 mL (5 mLs Subcutaneous Given 08/19/16 1623)     Initial Impression / Assessment and Plan / ED Course  I have reviewed the triage vital signs and the nursing notes.  Pertinent labs & imaging results that were available during my care of the patient were reviewed by me and considered in my medical decision making (see chart for details).  Patient with skin abscess. Incision and drainage performed in the ED today to left lower leg. Right lower leg lesion does not appear to be amenable for drainage via ultrasound. Abscess was large enough to warrant packing, placed about 8 cm, and given instructions to remove in about 2 days when she comes in for the wound check. Wound recheck in 2 days. Supportive care and return precautions discussed.  Pt sent home with clindamycin.    Pt with Hx of asthma, allergies and persistent, waxing and waning rash in the flexural folds for many years.  Hx and exam consistent with Eczema.  Pt instructed to begin antihistamine such as zyrtec, avoid hot baths/showers, use Eucerin or Aquaphor after every bath/shower and preferably BID, drink plenty of fluids and use triamcinolone cream on the rash for itching.  Pt given Rx for triamcinolone and recommend f/u with PCP and or [facility].  I have also discussed reasons to return immediately to the ER.  Patient expresses understanding and agrees with plan. I have also given her financial resource guide to establish care with a PCP.    Final Clinical Impressions(s) / ED Diagnoses   Final diagnoses:  Abscess   Eczema, unspecified type    New Prescriptions New Prescriptions   CLINDAMYCIN (CLEOCIN) 150 MG CAPSULE    Take 3 capsules (450 mg total) by mouth 3 (three) times daily.   TRIAMCINOLONE CREAM (KENALOG) 0.1 %    Apply 1 application topically 2 (two) times daily.   I personally performed the services described in this documentation, which was scribed in my presence. The recorded information has been reviewed and is accurate.    [address] [place], [place] 08/19/16 1731    Gerhard Munch, MD 08/19/16 2049

## 2016-08-19 NOTE — ED Notes (Signed)
EDP at bedside  

## 2016-08-19 NOTE — Discharge Instructions (Signed)
Please take clindamycin 3 times daily for 5 days. Use warm compress to the area and keep clean and dry. Please follow-up with her care provider using financial resource guide provided. Begin antihistamine such as zyrtec, avoid hot baths/showers, use Eucerin or Aquaphor after every bath/shower and preferably twice a day, drink plenty of fluids and use triamcinolone cream on the rash for itching.   Contact a health care provider if: Your cyst or abscess returns. You have a fever. You have more redness, swelling, or pain around your incision. You have more fluid or blood coming from your incision. Your incision feels warm to the touch. You have pus or a bad smell coming from your incision. Get help right away if: You have severe pain or bleeding. You cannot eat or drink without vomiting. You have decreased urine output. You become short of breath. You have chest pain. You cough up blood. The area where the incision and drainage occurred becomes numb or it tingles.

## 2016-08-19 NOTE — ED Triage Notes (Signed)
Pt from home with c/o bilateral LE abscesses with redness and swelling starting 4 days.  Pt reports a hx of the same and only given ABX.  NAD, A&O.

## 2016-08-21 ENCOUNTER — Encounter (HOSPITAL_COMMUNITY): Payer: Self-pay

## 2016-08-21 ENCOUNTER — Emergency Department (HOSPITAL_COMMUNITY)
Admission: EM | Admit: 2016-08-21 | Discharge: 2016-08-21 | Disposition: A | Discharge status: Home / Self Care | Payer: Medicaid Other | Attending: Emergency Medicine | Admitting: Emergency Medicine

## 2016-08-21 DIAGNOSIS — Z79899 Other long term (current) drug therapy: Secondary | ICD-10-CM | POA: Insufficient documentation

## 2016-08-21 DIAGNOSIS — Z5189 Encounter for other specified aftercare: Secondary | ICD-10-CM

## 2016-08-21 DIAGNOSIS — Z4801 Encounter for change or removal of surgical wound dressing: Secondary | ICD-10-CM | POA: Insufficient documentation

## 2016-08-21 DIAGNOSIS — F17210 Nicotine dependence, cigarettes, uncomplicated: Secondary | ICD-10-CM | POA: Insufficient documentation

## 2016-08-21 DIAGNOSIS — J45909 Unspecified asthma, uncomplicated: Secondary | ICD-10-CM | POA: Insufficient documentation

## 2016-08-21 NOTE — ED Triage Notes (Signed)
Pt had would packed on LLE. She is here to have packing removed. No other complaints noted.

## 2016-08-21 NOTE — Discharge Instructions (Signed)
Keep wound clean using soap and water, pat dry. You may take Tylenol or ibuprofen as prescribed over-the-counter as needed for pain relief. Please follow up with a primary care provider from the Resource Guide provided below as needed. Please return to the Emergency Department if symptoms worsen or new onset of fever, redness, swelling, warmth, drainage, vomiting.

## 2016-08-21 NOTE — ED Notes (Signed)
Pt stable, understands discharge instructions, and reasons for return. Signature pad not available.

## 2016-08-21 NOTE — ED Provider Notes (Signed)
[facility]PT Provider Note   CSN: [id number] Arrival date & time: 08/21/16  1714   By signing my name below, I, Soijett Blue, attest that this documentation has been prepared under the direction and in the presence of Melburn Hake, PA-C Electronically Signed: Soijett Blue, ED Scribe. 08/21/16. 8:05 PM.  History   Chief Complaint Chief Complaint  Patient presents with  . Wound Check    HPI Deanna [name] is a 21 y.o. female who presents to the Emergency Department for wound recheck. Pt had packing placed to her LLE due to an abscess to her LLE that she had I&D while in the ED. Pt has been clindamycin with improvement of symptoms. Denies fever, chills, redness, swelling, drainage or any other symptoms.   Per pt chart review: Pt was seen in the ED on 08/19/2016 for abscess to LLE. Pt had I&D completed and was prescribed clindamycin for her symptoms.    The history is provided by the patient. No language interpreter was used.    Past Medical History:  Diagnosis Date  . Allergy   . Anxiety   . Asthma   . Depression   . Eczema   . Eczema   . Ovarian cyst    resolved    Patient Active Problem List   Diagnosis Date Noted  . Drug overdose   . Bipolar 1 disorder (HCC) 06/02/2016  . Bipolar affective disorder, current episode depressed (HCC) 06/02/2016  . OVARIAN CYST, RIGHT 06/30/2008  . ECZEMA, ATOPIC DERMATITIS 08/20/2006    Past Surgical History:  Procedure Laterality Date  . OVARIAN CYST REMOVAL      OB History    No data available       Home Medications    Prior to Admission medications   Medication Sig Start Date End Date Taking? Authorizing Provider  clindamycin (CLEOCIN) 150 MG capsule Take 3 capsules (450 mg total) by mouth 3 (three) times daily. 08/19/16 08/24/16  Francisco Manuel Espina, [place]  triamcinolone cream (KENALOG) 0.1 % Apply 1 application topically 2 (two) times daily. 08/19/16   Francisco Orson Aloe, [place]    Family History Family  History  Problem Relation Age of Onset  . Asthma Mother   . Bipolar disorder Mother   . Asthma Father   . Hypertension Father   . Bipolar disorder Father   . Hypertension Paternal Grandmother   . Diabetes Paternal Grandmother   . Asthma Paternal Grandmother     Social History Social History  Substance Use Topics  . Smoking status: Current Every Day Smoker    Packs/day: 1.00    Years: 5.00    Types: Cigarettes  . Smokeless tobacco: Never Used  . Alcohol use No     Allergies   Dairy aid [lactase] and Penicillins   Review of Systems Review of Systems  Constitutional: Negative for chills and fever.  Skin: Positive for wound (healing I&D site to LLE). Negative for color change.     Physical Exam Updated Vital Signs BP 108/68 (BP Location: Left Arm)   Pulse 93   Temp 97.6 F (36.4 C) (Oral)   Resp 18   LMP 07/13/2016 (Exact Date)   SpO2 100%   Physical Exam  Constitutional: She is oriented to person, place, and time. She appears well-developed and well-nourished.  HENT:  Head: Normocephalic and atraumatic.  Eyes: Conjunctivae and EOM are normal. Right eye exhibits no discharge. Left eye exhibits no discharge. No scleral icterus.  Neck: Normal range of motion. Neck supple.  Cardiovascular: Normal rate and intact distal pulses.   Pulmonary/Chest: Effort normal.  Musculoskeletal: Normal range of motion. She exhibits no edema or tenderness.  FROM of LLE with 5/5 strength. 2+ pt pulse. Sensation grossly intact.   Neurological: She is alert and oriented to person, place, and time.  Skin: Skin is warm and dry. No erythema.  Small incision noted to left medial mid shin/calf with packing in place. Packing mildly soaked with sanguinous drainage. No surrounding swelling, erythema, or warmth.   Nursing note and vitals reviewed.    ED Treatments / Results  DIAGNOSTIC STUDIES: Oxygen Saturation is 100% on RA, nl by my interpretation.    COORDINATION OF CARE: 7:57 PM  Discussed treatment plan with pt at bedside which includes complete abx Rx and pt agreed to plan.   Procedures .Foreign Body Removal Date/Time: 08/21/2016 8:21 PM Performed by: Barrett HenleNADEAU, NICOLE ELIZABETH Authorized by: Barrett HenleNADEAU, NICOLE ELIZABETH  Consent: Verbal consent obtained. Risks and benefits: risks, benefits and alternatives were discussed Consent given by: patient Intake: left lower leg. Patient restrained: no Patient cooperative: yes Complexity: simple 1 objects recovered. Objects recovered: packing Post-procedure assessment: foreign body removed Patient tolerance: Patient tolerated the procedure well with no immediate complications   (including critical care time)  Medications Ordered in ED Medications - No data to display   Initial Impression / Assessment and Plan / ED Course  I have reviewed the triage vital signs and the nursing notes.   Patient presents for wound recheck. She reports having an abscess drained to her left lower leg on 08/19/16 in which packing was placed. Patient was advised to return to the ED today to have packing removed. She states she has been taking her prescription of clindamycin as prescribed. Denies fever. Reports swelling and redness have significantly improved. VSS. Exam revealed incision wound with packing in place, no evidence of associated cellulitis. Packing removed without any complete locations. Plan to discharge patient home with wound care. Discussed return precautions. Advised patient to continue taking her prescription of clindamycin until completed.  Final Clinical Impressions(s) / ED Diagnoses   Final diagnoses:  Visit for wound check    New Prescriptions New Prescriptions   No medications on file   I personally performed the services described in this documentation, which was scribed in my presence. The recorded information has been reviewed and is accurate.     Satira Sarkicole Elizabeth CortlandNadeau, [place] 08/21/16 2022    Gerhard Munchobert  Lockwood, MD 08/21/16 2036

## 2016-08-21 NOTE — ED Notes (Signed)
Pt stable, understands discharge instructions, and reasons for return.   

## 2016-11-25 ENCOUNTER — Emergency Department (HOSPITAL_BASED_OUTPATIENT_CLINIC_OR_DEPARTMENT_OTHER): Payer: Medicaid Other

## 2016-11-25 ENCOUNTER — Encounter (HOSPITAL_BASED_OUTPATIENT_CLINIC_OR_DEPARTMENT_OTHER): Payer: Self-pay | Admitting: *Deleted

## 2016-11-25 ENCOUNTER — Emergency Department (HOSPITAL_BASED_OUTPATIENT_CLINIC_OR_DEPARTMENT_OTHER)
Admission: EM | Admit: 2016-11-25 | Discharge: 2016-11-25 | Disposition: A | Discharge status: Home / Self Care | Payer: Medicaid Other | Attending: Physician Assistant | Admitting: Physician Assistant

## 2016-11-25 DIAGNOSIS — J45909 Unspecified asthma, uncomplicated: Secondary | ICD-10-CM | POA: Insufficient documentation

## 2016-11-25 DIAGNOSIS — B9789 Other viral agents as the cause of diseases classified elsewhere: Secondary | ICD-10-CM

## 2016-11-25 DIAGNOSIS — R112 Nausea with vomiting, unspecified: Secondary | ICD-10-CM | POA: Insufficient documentation

## 2016-11-25 DIAGNOSIS — J069 Acute upper respiratory infection, unspecified: Secondary | ICD-10-CM | POA: Insufficient documentation

## 2016-11-25 DIAGNOSIS — F17210 Nicotine dependence, cigarettes, uncomplicated: Secondary | ICD-10-CM | POA: Insufficient documentation

## 2016-11-25 LAB — RAPID STREP SCREEN (MED CTR MEBANE ONLY): STREPTOCOCCUS, GROUP A SCREEN (DIRECT): NEGATIVE

## 2016-11-25 MED ORDER — AEROCHAMBER PLUS FLO-VU MEDIUM MISC
1.0000 | Freq: Once | Status: AC
  Administered 2016-11-25: 1
  Filled 2016-11-25: qty 1

## 2016-11-25 MED ORDER — ONDANSETRON 4 MG PO TBDP: 4.0000 mg | ORAL_TABLET | Freq: Three times a day (TID) | ORAL | 0 refills | Status: DC | PRN

## 2016-11-25 MED ORDER — ALBUTEROL SULFATE HFA 108 (90 BASE) MCG/ACT IN AERS
2.0000 | INHALATION_SPRAY | Freq: Once | RESPIRATORY_TRACT | Status: AC
  Administered 2016-11-25: 2 via RESPIRATORY_TRACT
  Filled 2016-11-25: qty 6.7

## 2016-11-25 MED ORDER — ACETAMINOPHEN 500 MG PO TABS
1000.0000 mg | ORAL_TABLET | Freq: Once | ORAL | Status: AC
  Administered 2016-11-25: 1000 mg via ORAL
  Filled 2016-11-25: qty 2

## 2016-11-25 MED ORDER — ALBUTEROL SULFATE HFA 108 (90 BASE) MCG/ACT IN AERS: 2.0000 | INHALATION_SPRAY | Freq: Four times a day (QID) | RESPIRATORY_TRACT | 2 refills | Status: DC | PRN

## 2016-11-25 MED ORDER — ONDANSETRON 4 MG PO TBDP
4.0000 mg | ORAL_TABLET | Freq: Once | ORAL | Status: AC
  Administered 2016-11-25: 4 mg via ORAL
  Filled 2016-11-25: qty 1

## 2016-11-25 NOTE — ED Notes (Signed)
Pt verbalizes understanding of d/c instructions and denies any further needs at this time. 

## 2016-11-25 NOTE — ED Triage Notes (Signed)
Cough, cold symptoms, vomiting since last night. Body aches. Headache.

## 2016-11-25 NOTE — ED Provider Notes (Signed)
[facility] Provider Note   CSN: [id number] Arrival date & time: 11/25/16  2046  By signing my name below, I, Diona Browner, attest that this documentation has been prepared under the direction and in the presence of Mackuen, Cindee Salt, MD. Electronically Signed: Diona Browner, ED Scribe. 11/25/16. 10:43 PM.  History   Chief Complaint Chief Complaint  Patient presents with  . Cough    HPI Deanna [name] is a 21 y.o. female who presents to the Emergency Department complaining of a nonproductive cough that started last night (11/24/16). Associated sx include body aches, HA, nausea, vomiting, loss of appetite, photophobia, fever, chills, sore throat, and rhinorrhea. She has been taking ibuprofen with mild relief. No other modifying factors noted. Pt denies dysuria.  The history is provided by the patient. No language interpreter was used.    Past Medical History:  Diagnosis Date  . Allergy   . Anxiety   . Asthma   . Depression   . Eczema   . Eczema   . Ovarian cyst    resolved    Patient Active Problem List   Diagnosis Date Noted  . Drug overdose   . Bipolar 1 disorder (HCC) 06/02/2016  . Bipolar affective disorder, current episode depressed (HCC) 06/02/2016  . OVARIAN CYST, RIGHT 06/30/2008  . ECZEMA, ATOPIC DERMATITIS 08/20/2006    Past Surgical History:  Procedure Laterality Date  . OVARIAN CYST REMOVAL      OB History    No data available       Home Medications    Prior to Admission medications   Medication Sig Start Date End Date Taking? Authorizing Provider  triamcinolone cream (KENALOG) 0.1 % Apply 1 application topically 2 (two) times daily. 08/19/16   Alvina Chou, PA    Family History Family History  Problem Relation Age of Onset  . Asthma Mother   . Bipolar disorder Mother   . Asthma Father   . Hypertension Father   . Bipolar disorder Father   . Hypertension Paternal Grandmother   . Diabetes Paternal  Grandmother   . Asthma Paternal Grandmother     Social History Social History  Substance Use Topics  . Smoking status: Current Every Day Smoker    Packs/day: 1.00    Years: 5.00    Types: Cigarettes  . Smokeless tobacco: Never Used  . Alcohol use No     Allergies   Dairy aid [lactase] and Penicillins   Review of Systems Review of Systems  Constitutional: Positive for appetite change, chills and fever.  HENT: Positive for rhinorrhea and sore throat.   Eyes: Positive for photophobia.  Respiratory: Positive for cough.   Gastrointestinal: Positive for nausea and vomiting.  Genitourinary: Negative for dysuria.  Musculoskeletal: Positive for arthralgias and myalgias.  Neurological: Positive for headaches.     Physical Exam Updated Vital Signs BP 102/64 (BP Location: Left Arm)   Pulse 96   Temp 99.1 F (37.3 C) (Oral)   Resp 18   Ht 5\' 3"  (1.6 m)   Wt 128 lb (58.1 kg)   LMP 11/06/2016   SpO2 100%   BMI 22.67 kg/m   Physical Exam  Constitutional: She is oriented to person, place, and time. She appears well-developed and well-nourished.  HENT:  Head: Normocephalic.  Erythema to posterior external larynx.   Eyes: EOM are normal.  Neck: Normal range of motion.  Pulmonary/Chest: Effort normal.  Mild crackle to right lower lung.   Abdominal: She exhibits no distension.  Musculoskeletal: Normal range of motion.  Neurological: She is alert and oriented to person, place, and time.  Psychiatric: She has a normal mood and affect.  Nursing note and vitals reviewed.    ED Treatments / Results  DIAGNOSTIC STUDIES: Oxygen Saturation is 100% on RA, normal by my interpretation.   COORDINATION OF CARE: 10:43 PM-Discussed next steps with pt which includes taking ibuprofen, tylenol and Zofran. Using an inhaler, and staying hydrated. Pt verbalized understanding and is agreeable with the plan.   Labs (all labs ordered are listed, but only abnormal results are  displayed) Labs Reviewed  RAPID STREP SCREEN (NOT AT [facility])  CULTURE, GROUP A STREP [facility])    EKG  EKG Interpretation None       Radiology Dg Chest 2 View  Result Date: 11/25/2016 CLINICAL DATA:  Cough and cold symptoms with vomiting since last night. EXAM: CHEST  2 VIEW COMPARISON:  03/26/2016 FINDINGS: Lungs are clear. Cardiomediastinal silhouette, bones and soft tissues are normal. IMPRESSION: No active cardiopulmonary disease. Electronically Signed   By: Elberta Fortisaniel  Boyle M.D.   On: 11/25/2016 21:42    Procedures Procedures (including critical care time)  Medications Ordered in ED Medications  ondansetron (ZOFRAN-ODT) disintegrating tablet 4 mg (not administered)  acetaminophen (TYLENOL) tablet 1,000 mg (1,000 mg Oral Given 11/25/16 2103)  albuterol (PROVENTIL HFA;VENTOLIN HFA) 108 (90 Base) MCG/ACT inhaler 2 puff (2 puffs Inhalation Given 11/25/16 2247)  AEROCHAMBER PLUS FLO-VU MEDIUM MISC 1 each (1 each Other Given 11/25/16 2248)     Initial Impression / Assessment and Plan / ED Course  I have reviewed the triage vital signs and the nursing notes.  Pertinent labs & imaging results that were available during my care of the patient were reviewed by me and considered in my medical decision making (see chart for details).     I personally performed the services described in this documentation, which was scribed in my presence. The recorded information has been reviewed and is accurate.  ' 21 year old female presenting with cold symptoms. Cough, fever, nausea. CXR normal. We'll treat with supportive care.  Patient is comfortable, ambulatory, and taking PO at time of discharge.  Patient expressed understanding about return precautions.     Final Clinical Impressions(s) / ED Diagnoses   Final diagnoses:  None    New Prescriptions New Prescriptions   No medications on file     Abelino DerrickMackuen, Courteney Lyn, MD 11/25/16 2326

## 2016-11-25 NOTE — Discharge Instructions (Signed)
Please use ibuprofen and tylenol to help with your symptoms and fever. Use zofran to help with naueas and stay hydrated with gatorade. Return if worsening shortness of breath or trouble staying hydrated.

## 2016-11-28 LAB — CULTURE, GROUP A STREP (THRC)

## 2017-01-13 ENCOUNTER — Emergency Department (HOSPITAL_BASED_OUTPATIENT_CLINIC_OR_DEPARTMENT_OTHER)
Admission: EM | Admit: 2017-01-13 | Discharge: 2017-01-13 | Disposition: A | Discharge status: Home / Self Care | Payer: Medicaid Other | Attending: Emergency Medicine | Admitting: Emergency Medicine

## 2017-01-13 ENCOUNTER — Encounter (HOSPITAL_BASED_OUTPATIENT_CLINIC_OR_DEPARTMENT_OTHER): Payer: Self-pay | Admitting: *Deleted

## 2017-01-13 DIAGNOSIS — N9489 Other specified conditions associated with female genital organs and menstrual cycle: Secondary | ICD-10-CM

## 2017-01-13 DIAGNOSIS — L292 Pruritus vulvae: Secondary | ICD-10-CM | POA: Insufficient documentation

## 2017-01-13 DIAGNOSIS — F17210 Nicotine dependence, cigarettes, uncomplicated: Secondary | ICD-10-CM | POA: Insufficient documentation

## 2017-01-13 DIAGNOSIS — J45909 Unspecified asthma, uncomplicated: Secondary | ICD-10-CM | POA: Insufficient documentation

## 2017-01-13 DIAGNOSIS — R102 Pelvic and perineal pain: Secondary | ICD-10-CM | POA: Insufficient documentation

## 2017-01-13 LAB — URINALYSIS, MICROSCOPIC (REFLEX)

## 2017-01-13 LAB — URINALYSIS, ROUTINE W REFLEX MICROSCOPIC
Glucose, UA: NEGATIVE mg/dL
Ketones, ur: 15 mg/dL — AB
Leukocytes, UA: NEGATIVE
Nitrite: NEGATIVE
Protein, ur: NEGATIVE mg/dL
Specific Gravity, Urine: 1.031 — ABNORMAL HIGH (ref 1.005–1.030)
pH: 5.5 (ref 5.0–8.0)

## 2017-01-13 LAB — GC/CHLAMYDIA PROBE AMP (~~LOC~~) NOT AT ARMC
CHLAMYDIA, DNA PROBE: NEGATIVE
NEISSERIA GONORRHEA: NEGATIVE

## 2017-01-13 LAB — WET PREP, GENITAL
Clue Cells Wet Prep HPF POC: NONE SEEN
SPERM: NONE SEEN
Trich, Wet Prep: NONE SEEN
Yeast Wet Prep HPF POC: NONE SEEN

## 2017-01-13 LAB — PREGNANCY, URINE: PREG TEST UR: NEGATIVE

## 2017-01-13 MED ORDER — IBUPROFEN 600 MG PO TABS: 600.0000 mg | ORAL_TABLET | Freq: Four times a day (QID) | ORAL | 0 refills | Status: DC | PRN

## 2017-01-13 MED ORDER — DIPHENHYDRAMINE HCL 25 MG PO CAPS
25.0000 mg | ORAL_CAPSULE | Freq: Once | ORAL | Status: AC
  Administered 2017-01-13: 25 mg via ORAL
  Filled 2017-01-13: qty 1

## 2017-01-13 MED ORDER — HYDROXYZINE HCL 25 MG PO TABS: 25.0000 mg | ORAL_TABLET | Freq: Four times a day (QID) | ORAL | 0 refills | Status: DC

## 2017-01-13 MED ORDER — DESONIDE 0.05 % EX CREA: TOPICAL_CREAM | Freq: Two times a day (BID) | CUTANEOUS | 0 refills | Status: DC

## 2017-01-13 MED ORDER — IBUPROFEN 400 MG PO TABS
600.0000 mg | ORAL_TABLET | Freq: Once | ORAL | Status: AC
  Administered 2017-01-13: 04:00:00 600 mg via ORAL
  Filled 2017-01-13: qty 1

## 2017-01-13 NOTE — ED Triage Notes (Signed)
Pt reports "my poo poo hurts, its all swelled up and red" reports white vaginal DC as well. Pt request to be tested for everything. Pt states that her sx began 30 min PTA after showering with a new soap

## 2017-01-13 NOTE — Discharge Instructions (Signed)
You were seen today for labial swelling. Given your history of eczema, this is likely related to a contact dermatitis or irritant. Soak in a warm bath without additives 2-3 times per day. Apply steroid cream as provided. Ibuprofen and Atarax for pain and itching. Avoid new soaps or detergents. Wear cotton underwear. [facility] symptoms recur or persist.

## 2017-01-13 NOTE — ED Provider Notes (Signed)
[facility]   CSN: [id number] Arrival date & time: 01/13/17  0303     History   Chief Complaint Chief Complaint  Patient presents with  . Groin Swelling    HPI Deanna [name] is a 21 y.o. female.  HPI This is a 21 year old female with a history of eczema, asthma who presents with vulvar swelling and itching. Patient reports onset of symptoms this evening. She initially had itching in the vulvar region. It worsened after a shower. She reports that she has been using Dial soap for one week. She denies any new detergents, lotions or other irritants. She is a lesbian and denies any penetrative sex. Denies use of sex toys or latex condoms. She reports 8 out of 10 pain. She did take Benadryl prior to coming. She denies any vaginal discharge or urinary symptoms. Patient does report that she has had similar symptoms in the past that have self resolved.  Past Medical History:  Diagnosis Date  . Allergy   . Anxiety   . Asthma   . Depression   . Eczema   . Eczema   . Ovarian cyst    resolved    Patient Active Problem List   Diagnosis Date Noted  . Drug overdose   . Bipolar 1 disorder (HCC) 06/02/2016  . Bipolar affective disorder, current episode depressed (HCC) 06/02/2016  . OVARIAN CYST, RIGHT 06/30/2008  . ECZEMA, ATOPIC DERMATITIS 08/20/2006    Past Surgical History:  Procedure Laterality Date  . OVARIAN CYST REMOVAL      OB History    No data available       Home Medications    Prior to Admission medications   Medication Sig Start Date End Date Taking? Authorizing Provider  albuterol (PROVENTIL HFA;VENTOLIN HFA) 108 (90 Base) MCG/ACT inhaler Inhale 2 puffs into the lungs every 6 (six) hours as needed for wheezing or shortness of breath. 11/25/16   Mackuen, Courteney Lyn, MD  desonide (DESOWEN) 0.05 % cream Apply topically 2 (two) times daily. 01/13/17   Marien Manship, Mayer Masker, MD  hydrOXYzine (ATARAX/VISTARIL) 25 MG tablet Take 1 tablet (25  mg total) by mouth every 6 (six) hours. 01/13/17   Mihcael Ledee, Mayer Masker, MD  ibuprofen (ADVIL,MOTRIN) 600 MG tablet Take 1 tablet (600 mg total) by mouth every 6 (six) hours as needed. 01/13/17   Crucita Lacorte, Mayer Masker, MD  triamcinolone cream (KENALOG) 0.1 % Apply 1 application topically 2 (two) times daily. 08/19/16   Alvina Chou, PA    Family History Family History  Problem Relation Age of Onset  . Asthma Mother   . Bipolar disorder Mother   . Asthma Father   . Hypertension Father   . Bipolar disorder Father   . Hypertension Paternal Grandmother   . Diabetes Paternal Grandmother   . Asthma Paternal Grandmother     Social History Social History  Substance Use Topics  . Smoking status: Current Every Day Smoker    Packs/day: 1.00    Years: 5.00    Types: Cigarettes  . Smokeless tobacco: Never Used  . Alcohol use No     Allergies   Dairy aid [lactase] and Penicillins   Review of Systems Review of Systems  Constitutional: Negative for fever.  Gastrointestinal: Negative for abdominal pain, nausea and vomiting.  Genitourinary: Positive for vaginal pain. Negative for decreased urine volume, dysuria, pelvic pain, vaginal bleeding and vaginal discharge.  Skin: Negative for rash.  All other systems reviewed and are negative.  Physical Exam Updated Vital Signs BP (!) 91/48 (BP Location: Right Arm)   Pulse 87   Temp 97.8 F (36.6 C) (Oral)   Resp 18   LMP 12/30/2016   SpO2 98%   Physical Exam  Constitutional: She is oriented to person, place, and time. She appears well-developed and well-nourished. No distress.  HENT:  Head: Normocephalic and atraumatic.  Cardiovascular: Normal rate and regular rhythm.   Pulmonary/Chest: Effort normal. No respiratory distress.  Genitourinary:  Genitourinary Comments: Diffuse swelling of the labia majora and minora, no obvious erythema, rash, excoriations, tenderness with insertion of speculum, moderate vaginal discharge noted    Neurological: She is alert and oriented to person, place, and time.  Skin: Skin is warm and dry.  Psychiatric: She has a normal mood and affect.  Nursing note and vitals reviewed.    ED Treatments / Results  Labs (all labs ordered are listed, but only abnormal results are displayed) Labs Reviewed  WET PREP, GENITAL - Abnormal; Notable for the following:       Result Value   WBC, Wet Prep HPF POC MODERATE (*)    All other components within normal limits  URINALYSIS, ROUTINE W REFLEX MICROSCOPIC - Abnormal; Notable for the following:    Specific Gravity, Urine 1.031 (*)    Hgb urine dipstick SMALL (*)    Bilirubin Urine SMALL (*)    Ketones, ur 15 (*)    All other components within normal limits  URINALYSIS, MICROSCOPIC (REFLEX) - Abnormal; Notable for the following:    Bacteria, UA FEW (*)    Squamous Epithelial / LPF 0-5 (*)    All other components within normal limits  PREGNANCY, URINE  GC/CHLAMYDIA PROBE AMP ([place]) NOT AT [facility]    EKG  EKG Interpretation None       Radiology No results found.  Procedures Procedures (including critical care time)  Medications Ordered in ED Medications  ibuprofen (ADVIL,MOTRIN) tablet 600 mg (600 mg Oral Given 01/13/17 0337)  diphenhydrAMINE (BENADRYL) capsule 25 mg (25 mg Oral Given 01/13/17 [id number])     Initial Impression / Assessment and Plan / ED Course  I have reviewed the triage vital signs and the nursing notes.  Pertinent labs & imaging results that were available during my care of the patient were reviewed by me and considered in my medical decision making (see chart for details).     Patient presents with swelling of the vulva and labia. She is nontoxic-appearing. Given her history of asthma and eczema, consider contact dermatitis as the most likely all. Patient does report a history of similar symptoms in the past. She was screened for STDs. Initial testing is reassuring. Will discharge home with steroid cream,  hydroxyzine and ibuprofen as needed for pain. Recommend sitz baths. Women's follow-up provided.  After history, exam, and medical workup I feel the patient has been appropriately medically screened and is safe for discharge home. Pertinent diagnoses were discussed with the patient. Patient was given return precautions.   Final Clinical Impressions(s) / ED Diagnoses   Final diagnoses:  Labial swelling    New Prescriptions New Prescriptions   DESONIDE (DESOWEN) 0.05 % CREAM    Apply topically 2 (two) times daily.   HYDROXYZINE (ATARAX/VISTARIL) 25 MG TABLET    Take 1 tablet (25 mg total) by mouth every 6 (six) hours.   IBUPROFEN (ADVIL,MOTRIN) 600 MG TABLET    Take 1 tablet (600 mg total) by mouth every 6 (six) hours as needed.  Shon Baton, MD 01/13/17 [phone]

## 2017-01-29 ENCOUNTER — Emergency Department (HOSPITAL_BASED_OUTPATIENT_CLINIC_OR_DEPARTMENT_OTHER)
Admission: EM | Admit: 2017-01-29 | Discharge: 2017-01-29 | Disposition: A | Discharge status: Home / Self Care | Payer: Medicaid Other | Attending: Emergency Medicine | Admitting: Emergency Medicine

## 2017-01-29 ENCOUNTER — Encounter (HOSPITAL_BASED_OUTPATIENT_CLINIC_OR_DEPARTMENT_OTHER): Payer: Self-pay | Admitting: *Deleted

## 2017-01-29 DIAGNOSIS — K602 Anal fissure, unspecified: Secondary | ICD-10-CM | POA: Insufficient documentation

## 2017-01-29 DIAGNOSIS — F17210 Nicotine dependence, cigarettes, uncomplicated: Secondary | ICD-10-CM | POA: Insufficient documentation

## 2017-01-29 DIAGNOSIS — Z91010 Allergy to peanuts: Secondary | ICD-10-CM | POA: Insufficient documentation

## 2017-01-29 DIAGNOSIS — J45909 Unspecified asthma, uncomplicated: Secondary | ICD-10-CM | POA: Insufficient documentation

## 2017-01-29 DIAGNOSIS — K640 First degree hemorrhoids: Secondary | ICD-10-CM | POA: Insufficient documentation

## 2017-01-29 NOTE — ED Triage Notes (Signed)
Pt with rectal itching and bleeding, bleeding has been present for 2-3 years and itching a while now worsening in last few days.

## 2017-01-29 NOTE — ED Provider Notes (Signed)
[facility]   CSN: [id number] Arrival date & time: 01/29/17  0205     History   Chief Complaint Chief Complaint  Patient presents with  . Rectal Bleeding    HPI Deanna [name] is a 21 y.o. female.  The history is provided by the patient.  Rectal Bleeding  Quality:  Bright red Amount:  Scant Timing:  Rare Chronicity:  New Context: constipation   Context: not anal fissures and not anal penetration   Relieved by:  Nothing Worsened by:  Nothing Ineffective treatments:  None tried Associated symptoms: no abdominal pain, no fever and no vomiting   Risk factors: no anticoagulant use and no hx of IBD     Past Medical History:  Diagnosis Date  . Allergy   . Anxiety   . Asthma   . Depression   . Eczema   . Eczema   . Ovarian cyst    resolved    Patient Active Problem List   Diagnosis Date Noted  . Drug overdose   . Bipolar 1 disorder (HCC) 06/02/2016  . Bipolar affective disorder, current episode depressed (HCC) 06/02/2016  . OVARIAN CYST, RIGHT 06/30/2008  . ECZEMA, ATOPIC DERMATITIS 08/20/2006    Past Surgical History:  Procedure Laterality Date  . OVARIAN CYST REMOVAL      OB History    No data available       Home Medications    Prior to Admission medications   Medication Sig Start Date End Date Taking? Authorizing Provider  albuterol (PROVENTIL HFA;VENTOLIN HFA) 108 (90 Base) MCG/ACT inhaler Inhale 2 puffs into the lungs every 6 (six) hours as needed for wheezing or shortness of breath. 11/25/16   Mackuen, Cindee Salt, MD    Family History Family History  Problem Relation Age of Onset  . Asthma Mother   . Bipolar disorder Mother   . Asthma Father   . Hypertension Father   . Bipolar disorder Father   . Hypertension Paternal Grandmother   . Diabetes Paternal Grandmother   . Asthma Paternal Grandmother     Social History Social History  Substance Use Topics  . Smoking status: Current Every Day Smoker   Packs/day: 1.00    Years: 5.00    Types: Cigarettes  . Smokeless tobacco: Never Used  . Alcohol use No     Allergies   Dairy aid [lactase]; Peanut (diagnostic); and Penicillins   Review of Systems Review of Systems  Constitutional: Negative for fever.  Gastrointestinal: Positive for constipation and hematochezia. Negative for abdominal pain and vomiting.  All other systems reviewed and are negative.    Physical Exam Updated Vital Signs BP 111/67   Pulse 81   Temp 98.2 F (36.8 C) (Oral)   Resp 18   Ht 5\' 3"  (1.6 m)   Wt 59 kg (130 lb)   LMP 12/30/2016   SpO2 99%   BMI 23.03 kg/m   Physical Exam  Constitutional: She is oriented to person, place, and time. She appears well-developed and well-nourished. No distress.  HENT:  Head: Normocephalic and atraumatic.  Nose: Nose normal.  Eyes: Conjunctivae and EOM are normal.  Neck: Normal range of motion. Neck supple. No JVD present.  Cardiovascular: Normal rate, regular rhythm, normal heart sounds and intact distal pulses.   Pulmonary/Chest: Effort normal and breath sounds normal. No respiratory distress. She has no wheezes. She has no rales.  Abdominal: Soft. Bowel sounds are normal. She exhibits no mass. There is no tenderness. There  is no rebound and no guarding.  Genitourinary:  Genitourinary Comments: Anal fissure internal hemorrhoid  Musculoskeletal: Normal range of motion.  Neurological: She is alert and oriented to person, place, and time.  Skin: Skin is warm and dry. Capillary refill takes less than 2 seconds.  Psychiatric: She has a normal mood and affect.     ED Treatments / Results   Procedures Procedures (including critical care time)   Final Clinical Impressions(s) / ED Diagnoses  Anal fissure likely secondary to straining. Start miralax.    She is very well appearing and has been observed in the ED.  I do not see any indication at this time for labs or imaging as this happened once and we have an  obvious source.  Strict return precautions given for continued bleeding,  Abdominal pain, weakness, inability to tolerate oral liquids or foods, shortness of breath, changes in vision or thinking, weakness or numbness or any concerns. No signs of systemic illness or infection. The patient is nontoxic-appearing on exam and vital signs are within normal limits.     I have reviewed the triage vital signs and the nursing notes. Pertinent labs &imaging results that were available during my care of the patient were reviewed by me and considered in my medical decision making (see chart for details).  After history, exam, and medical workup I feel the patient has been appropriately medically screened and is safe for discharge home. Pertinent diagnoses were discussed with the patient. Patient was given return precautions.    Mukund Weinreb, MD 01/29/17 [phone]

## 2017-01-29 NOTE — Discharge Instructions (Signed)
Miralax once a day

## 2017-03-05 ENCOUNTER — Emergency Department (HOSPITAL_BASED_OUTPATIENT_CLINIC_OR_DEPARTMENT_OTHER)
Admission: EM | Admit: 2017-03-05 | Discharge: 2017-03-05 | Disposition: A | Discharge status: Home / Self Care | Payer: Self-pay | Attending: Emergency Medicine | Admitting: Emergency Medicine

## 2017-03-05 ENCOUNTER — Encounter (HOSPITAL_BASED_OUTPATIENT_CLINIC_OR_DEPARTMENT_OTHER): Payer: Self-pay

## 2017-03-05 DIAGNOSIS — J020 Streptococcal pharyngitis: Secondary | ICD-10-CM | POA: Insufficient documentation

## 2017-03-05 DIAGNOSIS — F17210 Nicotine dependence, cigarettes, uncomplicated: Secondary | ICD-10-CM | POA: Insufficient documentation

## 2017-03-05 DIAGNOSIS — Z91010 Allergy to peanuts: Secondary | ICD-10-CM | POA: Insufficient documentation

## 2017-03-05 DIAGNOSIS — J45909 Unspecified asthma, uncomplicated: Secondary | ICD-10-CM | POA: Insufficient documentation

## 2017-03-05 LAB — RAPID STREP SCREEN (MED CTR MEBANE ONLY): STREPTOCOCCUS, GROUP A SCREEN (DIRECT): POSITIVE — AB

## 2017-03-05 MED ORDER — ACETAMINOPHEN 325 MG PO TABS
650.0000 mg | ORAL_TABLET | Freq: Once | ORAL | Status: AC
  Administered 2017-03-05: 650 mg via ORAL
  Filled 2017-03-05: qty 2

## 2017-03-05 MED ORDER — CLINDAMYCIN HCL 150 MG PO CAPS
300.0000 mg | ORAL_CAPSULE | Freq: Once | ORAL | Status: AC
  Administered 2017-03-05: 300 mg via ORAL
  Filled 2017-03-05: qty 2

## 2017-03-05 MED ORDER — CLINDAMYCIN HCL 300 MG PO CAPS: 300.0000 mg | ORAL_CAPSULE | Freq: Four times a day (QID) | ORAL | 0 refills | Status: DC

## 2017-03-05 MED ORDER — IBUPROFEN 800 MG PO TABS
800.0000 mg | ORAL_TABLET | Freq: Once | ORAL | Status: AC
  Administered 2017-03-05: 800 mg via ORAL
  Filled 2017-03-05: qty 1

## 2017-03-05 MED ORDER — IBUPROFEN 800 MG PO TABS: 800.0000 mg | ORAL_TABLET | Freq: Three times a day (TID) | ORAL | 0 refills | Status: DC

## 2017-03-05 NOTE — ED Notes (Signed)
Pt verbalizes understanding of d/c instructions and denies any further needs at this time. 

## 2017-03-05 NOTE — ED Notes (Signed)
ED Provider at bedside. 

## 2017-03-05 NOTE — ED Triage Notes (Addendum)
Pt c/o head, face, throat and generalized body pain for one day with three episodes of vomiting today, has not tried any OTC meds for pain.  Pt is swearing in triage, requested pt to use more appropriate language

## 2017-03-05 NOTE — ED Provider Notes (Addendum)
[facility] Provider Note   CSN: [id number] Arrival date & time: 03/05/17  0041     History   Chief Complaint Chief Complaint  Patient presents with  . Headache    HPI Deanna [name] is a 21 y.o. female.  The history is provided by the patient.  Sore Throat  This is a new problem. The current episode started yesterday. The problem occurs constantly. The problem has not changed since onset.Associated symptoms include headaches. Pertinent negatives include no chest pain, no abdominal pain and no shortness of breath. Associated symptoms comments: Also has a frontal headache that started tonight. Nothing aggravates the symptoms. Nothing relieves the symptoms. She has tried nothing for the symptoms. The treatment provided no relief.  Headache   This is a new problem. The current episode started 6 to 12 hours ago. The problem occurs constantly. The problem has not changed since onset.The headache is associated with nothing. The pain is located in the frontal region. The quality of the pain is described as dull. The pain is moderate. The pain does not radiate. Pertinent negatives include no shortness of breath. She has tried nothing for the symptoms. The treatment provided no relief.    Past Medical History:  Diagnosis Date  . Allergy   . Anxiety   . Asthma   . Depression   . Eczema   . Eczema   . Ovarian cyst    resolved    Patient Active Problem List   Diagnosis Date Noted  . Drug overdose   . Bipolar 1 disorder (HCC) 06/02/2016  . Bipolar affective disorder, current episode depressed (HCC) 06/02/2016  . OVARIAN CYST, RIGHT 06/30/2008  . ECZEMA, ATOPIC DERMATITIS 08/20/2006    Past Surgical History:  Procedure Laterality Date  . OVARIAN CYST REMOVAL      OB History    No data available       Home Medications    Prior to Admission medications   Medication Sig Start Date End Date Taking? Authorizing Provider  albuterol (PROVENTIL HFA;VENTOLIN HFA)  108 (90 Base) MCG/ACT inhaler Inhale 2 puffs into the lungs every 6 (six) hours as needed for wheezing or shortness of breath. 11/25/16   Mackuen, Courteney Lyn, MD  clindamycin (CLEOCIN) 300 MG capsule Take 1 capsule (300 mg total) by mouth 4 (four) times daily. X 7 days 03/05/17   Stacee Earp, MD  ibuprofen (ADVIL,MOTRIN) 800 MG tablet Take 1 tablet (800 mg total) by mouth 3 (three) times daily. 03/05/17   Aaleyah Witherow, MD    Family History Family History  Problem Relation Age of Onset  . Asthma Mother   . Bipolar disorder Mother   . Asthma Father   . Hypertension Father   . Bipolar disorder Father   . Hypertension Paternal Grandmother   . Diabetes Paternal Grandmother   . Asthma Paternal Grandmother     Social History Social History  Substance Use Topics  . Smoking status: Current Every Day Smoker    Packs/day: 1.00    Years: 5.00    Types: Cigarettes  . Smokeless tobacco: Never Used  . Alcohol use No     Allergies   Dairy aid [lactase]; Peanut (diagnostic); and Penicillins   Review of Systems Review of Systems  HENT: Positive for sore throat. Negative for congestion, drooling, facial swelling, sinus pain, sinus pressure, trouble swallowing and voice change.   Eyes: Negative for photophobia and visual disturbance.  Respiratory: Negative for shortness of breath.   Cardiovascular: Negative  for chest pain.  Gastrointestinal: Negative for abdominal pain.  Musculoskeletal: Negative for neck pain.  Neurological: Positive for headaches.  All other systems reviewed and are negative.    Physical Exam Updated Vital Signs BP 105/67 (BP Location: Left Arm)   Pulse 100   Temp 99.1 F (37.3 C) (Oral)   Resp 18   Ht  (1.6 m)   Wt 59 kg (130 lb)   LMP 01/31/2017   SpO2 100%   BMI 23.03 kg/m   Physical Exam  Constitutional: She is oriented to person, place, and time. She appears well-developed and well-nourished.  HENT:  Head: Normocephalic and atraumatic.    Mouth/Throat: Oropharynx is clear and moist. No oropharyngeal exudate.  Eyes: Pupils are equal, round, and reactive to light. Conjunctivae and EOM are normal.  No proptosis  Neck: Normal range of motion. Neck supple. No JVD present.  Cardiovascular: Normal rate, regular rhythm, normal heart sounds and intact distal pulses.   Pulmonary/Chest: Effort normal and breath sounds normal. No stridor. She has no wheezes. She has no rales.  Abdominal: Soft. Bowel sounds are normal. She exhibits no mass. There is no tenderness. There is no rebound and no guarding.  Musculoskeletal: Normal range of motion.  Lymphadenopathy:    She has no cervical adenopathy.  Neurological: She is alert and oriented to person, place, and time.  Skin: Skin is warm and dry. Capillary refill takes less than 2 seconds.  Psychiatric: She has a normal mood and affect.     ED Treatments / Results   Vitals:   03/05/17 0050 03/05/17 0207  BP: 105/67   Pulse: 100   Resp: 18   Temp: 100.2 F (37.9 C) 99.1 F (37.3 C)  SpO2: 100%     Labs (all labs ordered are listed, but only abnormal results are displayed) Labs Reviewed  RAPID STREP SCREEN (NOT AT [facility]) - Abnormal; Notable for the following:       Result Value   Streptococcus, Group A Screen (Direct) POSITIVE (*)    All other components within normal limits    Procedures Procedures (including critical care time)  Medications Ordered in ED Medications  acetaminophen (TYLENOL) tablet 650 mg (650 mg Oral Given 03/05/17 0055)  clindamycin (CLEOCIN) capsule 300 mg (300 mg Oral Given 03/05/17 0300)  ibuprofen (ADVIL,MOTRIN) tablet 800 mg (800 mg Oral Given 03/05/17 0300)       Final Clinical Impressions(s) / ED Diagnoses   Final diagnoses:  Strep throat   The patient is very well appearing and has been observed in the ED.  Strict return precautions given for facial swelling, worsening sore throat, voice change, difficulty swallowing shortness of breath,  worsening headache, neck stiffness, chest pain, dyspnea on exertion, new weakness or numbness changes in vision or speech,  Inability to tolerate liquids or food, changes in voice cough, altered mental status or any concerns. No signs of systemic illness or infection. The patient is nontoxic-appearing on exam and vital signs are within normal limits.    I have reviewed the triage vital signs and the nursing notes. Pertinent labs &imaging results that were available during my care of the patient were reviewed by me and considered in my medical decision making (see chart for details).  After history, exam, and medical workup I feel the patient has been appropriately medically screened and is safe for discharge home. Pertinent diagnoses were discussed with the patient. Patient was given return precautions.      New Prescriptions New Prescriptions  CLINDAMYCIN (CLEOCIN) 300 MG CAPSULE    Take 1 capsule (300 mg total) by mouth 4 (four) times daily. X 7 days   IBUPROFEN (ADVIL,MOTRIN) 800 MG TABLET    Take 1 tablet (800 mg total) by mouth 3 (three) times daily.     Chiquitta Matty, MD 03/05/17 [id number]    Cy BlamerPalumbo, Traci Plemons, MD 03/05/17 [id number]

## 2017-05-05 ENCOUNTER — Emergency Department (HOSPITAL_BASED_OUTPATIENT_CLINIC_OR_DEPARTMENT_OTHER): Payer: Self-pay

## 2017-05-05 ENCOUNTER — Other Ambulatory Visit: Payer: Self-pay

## 2017-05-05 ENCOUNTER — Encounter (HOSPITAL_BASED_OUTPATIENT_CLINIC_OR_DEPARTMENT_OTHER): Payer: Self-pay

## 2017-05-05 ENCOUNTER — Emergency Department (HOSPITAL_BASED_OUTPATIENT_CLINIC_OR_DEPARTMENT_OTHER)
Admission: EM | Admit: 2017-05-05 | Discharge: 2017-05-06 | Disposition: A | Discharge status: Home / Self Care | Payer: Self-pay | Attending: Emergency Medicine | Admitting: Emergency Medicine

## 2017-05-05 DIAGNOSIS — R51 Headache: Secondary | ICD-10-CM | POA: Insufficient documentation

## 2017-05-05 DIAGNOSIS — Y998 Other external cause status: Secondary | ICD-10-CM | POA: Insufficient documentation

## 2017-05-05 DIAGNOSIS — S0083XA Contusion of other part of head, initial encounter: Secondary | ICD-10-CM | POA: Insufficient documentation

## 2017-05-05 DIAGNOSIS — Z87891 Personal history of nicotine dependence: Secondary | ICD-10-CM | POA: Insufficient documentation

## 2017-05-05 DIAGNOSIS — R42 Dizziness and giddiness: Secondary | ICD-10-CM | POA: Insufficient documentation

## 2017-05-05 DIAGNOSIS — S63501A Unspecified sprain of right wrist, initial encounter: Secondary | ICD-10-CM | POA: Insufficient documentation

## 2017-05-05 DIAGNOSIS — J45909 Unspecified asthma, uncomplicated: Secondary | ICD-10-CM | POA: Insufficient documentation

## 2017-05-05 DIAGNOSIS — Y929 Unspecified place or not applicable: Secondary | ICD-10-CM | POA: Insufficient documentation

## 2017-05-05 DIAGNOSIS — S0990XA Unspecified injury of head, initial encounter: Secondary | ICD-10-CM | POA: Insufficient documentation

## 2017-05-05 DIAGNOSIS — Y939 Activity, unspecified: Secondary | ICD-10-CM | POA: Insufficient documentation

## 2017-05-05 DIAGNOSIS — Z91010 Allergy to peanuts: Secondary | ICD-10-CM | POA: Insufficient documentation
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
## 2017-05-05 NOTE — ED Notes (Signed)
Patient transported to X-ray 

## 2017-05-05 NOTE — ED Notes (Signed)
[facility] at bedside

## 2017-05-06 MED ORDER — IBUPROFEN 800 MG PO TABS: 800.0000 mg | ORAL_TABLET | Freq: Three times a day (TID) | ORAL | 0 refills | Status: DC

## 2017-05-06 MED ORDER — TRAMADOL HCL 50 MG PO TABS: 50.0000 mg | ORAL_TABLET | Freq: Four times a day (QID) | ORAL | 0 refills | Status: DC | PRN

## 2017-07-22 ENCOUNTER — Other Ambulatory Visit: Payer: Self-pay

## 2017-07-22 ENCOUNTER — Emergency Department (HOSPITAL_BASED_OUTPATIENT_CLINIC_OR_DEPARTMENT_OTHER)
Admission: EM | Admit: 2017-07-22 | Discharge: 2017-07-22 | Disposition: A | Discharge status: Home / Self Care | Payer: Self-pay | Attending: Emergency Medicine | Admitting: Emergency Medicine

## 2017-07-22 ENCOUNTER — Encounter (HOSPITAL_BASED_OUTPATIENT_CLINIC_OR_DEPARTMENT_OTHER): Payer: Self-pay

## 2017-07-22 DIAGNOSIS — Z91010 Allergy to peanuts: Secondary | ICD-10-CM | POA: Insufficient documentation

## 2017-07-22 DIAGNOSIS — N760 Acute vaginitis: Secondary | ICD-10-CM | POA: Insufficient documentation

## 2017-07-22 DIAGNOSIS — B9689 Other specified bacterial agents as the cause of diseases classified elsewhere: Secondary | ICD-10-CM

## 2017-07-22 DIAGNOSIS — J45909 Unspecified asthma, uncomplicated: Secondary | ICD-10-CM | POA: Insufficient documentation

## 2017-07-22 DIAGNOSIS — Z87891 Personal history of nicotine dependence: Secondary | ICD-10-CM | POA: Insufficient documentation

## 2017-07-22 LAB — URINALYSIS, ROUTINE W REFLEX MICROSCOPIC
Bilirubin Urine: NEGATIVE
Glucose, UA: NEGATIVE mg/dL
Hgb urine dipstick: NEGATIVE
Ketones, ur: NEGATIVE mg/dL
Leukocytes, UA: NEGATIVE
NITRITE: NEGATIVE
PH: 8.5 — AB (ref 5.0–8.0)
Protein, ur: NEGATIVE mg/dL
Specific Gravity, Urine: 1.015 (ref 1.005–1.030)

## 2017-07-22 LAB — WET PREP, GENITAL
Sperm: NONE SEEN
Trich, Wet Prep: NONE SEEN
Yeast Wet Prep HPF POC: NONE SEEN

## 2017-07-22 LAB — PREGNANCY, URINE: Preg Test, Ur: NEGATIVE

## 2017-07-22 MED ORDER — METRONIDAZOLE 500 MG PO TABS: 500.0000 mg | ORAL_TABLET | Freq: Two times a day (BID) | ORAL | 0 refills | Status: DC

## 2017-07-22 NOTE — ED Notes (Signed)
Pt verbalizes understanding of d/c instructions and denies any further needs at this time. 

## 2017-07-22 NOTE — ED Notes (Signed)
Pt c/o thin vaginal discharge once this month and again last month.  She denies pain, denies concerns for STDs

## 2017-07-22 NOTE — ED Triage Notes (Signed)
C/o vaginal d/c x 1 month-NAD-steady gait 

## 2017-07-22 NOTE — Discharge Instructions (Signed)
Take Flagyl twice daily for 1 week.  Do not drink alcohol while taking this medication.  Use a mild, unscented soap.  You will be called if you test positive for gonorrhea or chlamydia.  Please follow-up with the health department for treatment if you are called with a positive result.  Please return to the emergency department if you develop any new or worsening symptoms.

## 2017-07-23 NOTE — ED Provider Notes (Signed)
[facility] EMERGENCY DEPARTMENT Provider Note   CSN: [id number] Arrival date & time: 07/22/17  2052     History   Chief Complaint Chief Complaint  Patient presents with  . Vaginal Discharge    HPI Deanna [name] is a 22 y.o. female with a one-month history of vaginal discharge.  She describes a foul odor and white color.  She denies pain.  She does report changing his soap recently for her eczema and has been using it around her vaginal area.  She denies any concern for STD exposure.  She denies any abnormal vaginal bleeding or urinary symptoms.  LMP 07/09/2017 which was normal.  She has not tried any medications over-the-counter.  HPI  Past Medical History:  Diagnosis Date  . Allergy   . Anxiety   . Asthma   . Depression   . Eczema   . Eczema   . Ovarian cyst    resolved    Patient Active Problem List   Diagnosis Date Noted  . Drug overdose   . Bipolar 1 disorder (HCC) 06/02/2016  . Bipolar affective disorder, current episode depressed (HCC) 06/02/2016  . OVARIAN CYST, RIGHT 06/30/2008  . ECZEMA, ATOPIC DERMATITIS 08/20/2006    Past Surgical History:  Procedure Laterality Date  . OVARIAN CYST REMOVAL      OB History    No data available       Home Medications    Prior to Admission medications   Medication Sig Start Date End Date Taking? Authorizing Provider  metroNIDAZOLE (FLAGYL) 500 MG tablet Take 1 tablet (500 mg total) by mouth 2 (two) times daily. 07/22/17   Emi HolesLaw, Journi Moffa M, PA-C    Family History Family History  Problem Relation Age of Onset  . Asthma Mother   . Bipolar disorder Mother   . Asthma Father   . Hypertension Father   . Bipolar disorder Father   . Hypertension Paternal Grandmother   . Diabetes Paternal Grandmother   . Asthma Paternal Grandmother     Social History Social History   Tobacco Use  . Smoking status: Former Smoker    Packs/day: 0.00    Years: 0.00    Pack years: 0.00  . Smokeless tobacco: Never  Used  Substance Use Topics  . Alcohol use: No  . Drug use: No     Allergies   Dairy aid [lactase]; Peanut (diagnostic); and Penicillins   Review of Systems Review of Systems  Constitutional: Negative for chills and fever.  HENT: Negative for facial swelling and sore throat.   Respiratory: Negative for shortness of breath.   Cardiovascular: Negative for chest pain.  Gastrointestinal: Negative for abdominal pain, nausea and vomiting.  Genitourinary: Positive for vaginal discharge. Negative for dysuria and vaginal bleeding.  Musculoskeletal: Negative for back pain.  Skin: Negative for rash and wound.  Neurological: Negative for headaches.  Psychiatric/Behavioral: The patient is not nervous/anxious.      Physical Exam Updated Vital Signs BP 107/66 (BP Location: Right Arm)   Pulse 92   Temp 98.7 F (37.1 C) (Oral)   Resp 18   Ht 5\' 3"  (1.6 m)   Wt 64 kg (141 lb)   LMP 07/09/2017   SpO2 98%   BMI 24.98 kg/m   Physical Exam  Constitutional: She appears well-developed and well-nourished. No distress.  HENT:  Head: Normocephalic and atraumatic.  Mouth/Throat: Oropharynx is clear and moist. No oropharyngeal exudate.  Eyes: Conjunctivae are normal. Pupils are equal, round, and reactive to light.  Right eye exhibits no discharge. Left eye exhibits no discharge. No scleral icterus.  Neck: Normal range of motion. Neck supple. No thyromegaly present.  Cardiovascular: Normal rate, regular rhythm, normal heart sounds and intact distal pulses. Exam reveals no gallop and no friction rub.  No murmur heard. Pulmonary/Chest: Effort normal and breath sounds normal. No stridor. No respiratory distress. She has no wheezes. She has no rales.  Abdominal: Soft. Bowel sounds are normal. She exhibits no distension. There is no tenderness. There is no rebound and no guarding.  Genitourinary: Uterus is not tender. Cervix exhibits discharge. Right adnexum displays no tenderness. Left adnexum  displays no tenderness. Vaginal discharge found.  Musculoskeletal: She exhibits no edema.  Lymphadenopathy:    She has no cervical adenopathy.  Neurological: She is alert. Coordination normal.  Skin: Skin is warm and dry. No rash noted. She is not diaphoretic. No pallor.  Psychiatric: She has a normal mood and affect.  Nursing note and vitals reviewed.    ED Treatments / Results  Labs (all labs ordered are listed, but only abnormal results are displayed) Labs Reviewed  WET PREP, GENITAL - Abnormal; Notable for the following components:      Result Value   Clue Cells Wet Prep HPF POC PRESENT (*)    WBC, Wet Prep HPF POC MODERATE (*)    All other components within normal limits  URINALYSIS, ROUTINE W REFLEX MICROSCOPIC - Abnormal; Notable for the following components:   pH 8.5 (*)    All other components within normal limits  PREGNANCY, URINE  GC/CHLAMYDIA PROBE AMP ([place]) NOT AT [facility]    EKG  EKG Interpretation None       Radiology No results found.  Procedures Procedures (including critical care time)  Medications Ordered in ED Medications - No data to display   Initial Impression / Assessment and Plan / ED Course  I have reviewed the triage vital signs and the nursing notes.  Pertinent labs & imaging results that were available during my care of the patient were reviewed by me and considered in my medical decision making (see chart for details).     Patient with bacterial vaginosis, suspect from change in soap.  UA is negative.  Wet prep shows clue cells and moderate WBCs.  GC/chlamydia sent and pending.  Patient declined HIV and RPR testing.  Will treat with Flagyl.  Patient advised she would be called with any positive results and that she should follow-up with health department for treatment, if positive.  Return precautions discussed.  Patient understands and agrees with plan.  Patient vitals stable throughout ED course and discharged in satisfactory  condition.  Final Clinical Impressions(s) / ED Diagnoses   Final diagnoses:  Bacterial vaginosis    ED Discharge Orders        Ordered    metroNIDAZOLE (FLAGYL) 500 MG tablet  2 times daily     07/22/17 [address] [place], PA-C 07/23/17 0031    Vanetta Mulders, MD 07/28/17 [phone]

## 2017-07-24 LAB — GC/CHLAMYDIA PROBE AMP (~~LOC~~) NOT AT ARMC
Chlamydia: NEGATIVE
Neisseria Gonorrhea: NEGATIVE

## 2017-10-08 ENCOUNTER — Other Ambulatory Visit: Payer: Self-pay

## 2017-10-08 DIAGNOSIS — Z79899 Other long term (current) drug therapy: Secondary | ICD-10-CM | POA: Diagnosis not present

## 2017-10-08 DIAGNOSIS — J45909 Unspecified asthma, uncomplicated: Secondary | ICD-10-CM | POA: Diagnosis not present

## 2017-10-08 DIAGNOSIS — Y999 Unspecified external cause status: Secondary | ICD-10-CM | POA: Insufficient documentation

## 2017-10-08 DIAGNOSIS — S63502A Unspecified sprain of left wrist, initial encounter: Secondary | ICD-10-CM | POA: Insufficient documentation

## 2017-10-08 DIAGNOSIS — Y92481 Parking lot as the place of occurrence of the external cause: Secondary | ICD-10-CM | POA: Insufficient documentation

## 2017-10-08 DIAGNOSIS — Z91010 Allergy to peanuts: Secondary | ICD-10-CM | POA: Diagnosis not present

## 2017-10-08 DIAGNOSIS — F17200 Nicotine dependence, unspecified, uncomplicated: Secondary | ICD-10-CM | POA: Insufficient documentation

## 2017-10-08 DIAGNOSIS — S6992XA Unspecified injury of left wrist, hand and finger(s), initial encounter: Secondary | ICD-10-CM | POA: Diagnosis present

## 2017-10-08 DIAGNOSIS — Y9389 Activity, other specified: Secondary | ICD-10-CM | POA: Insufficient documentation
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
## 2017-10-09 ENCOUNTER — Emergency Department (HOSPITAL_BASED_OUTPATIENT_CLINIC_OR_DEPARTMENT_OTHER): Payer: No Typology Code available for payment source

## 2017-10-09 ENCOUNTER — Encounter (HOSPITAL_BASED_OUTPATIENT_CLINIC_OR_DEPARTMENT_OTHER): Payer: Self-pay

## 2017-10-09 ENCOUNTER — Emergency Department (HOSPITAL_BASED_OUTPATIENT_CLINIC_OR_DEPARTMENT_OTHER)
Admission: EM | Admit: 2017-10-09 | Discharge: 2017-10-09 | Disposition: A | Discharge status: Home / Self Care | Payer: No Typology Code available for payment source | Attending: Emergency Medicine | Admitting: Emergency Medicine

## 2017-10-09 DIAGNOSIS — S63502A Unspecified sprain of left wrist, initial encounter: Secondary | ICD-10-CM

## 2017-10-09 MED ORDER — NAPROXEN 500 MG PO TABS: 500.0000 mg | ORAL_TABLET | Freq: Two times a day (BID) | ORAL | 0 refills | Status: DC

## 2017-10-09 NOTE — Discharge Instructions (Signed)
Your x-rays negative.  Wear the splint and follow-up with your doctor.  Return to the ED if you develop new numbness, weakness, tingling or any other concerns.
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
## 2017-10-29 ENCOUNTER — Encounter (HOSPITAL_BASED_OUTPATIENT_CLINIC_OR_DEPARTMENT_OTHER): Payer: Self-pay

## 2017-10-29 ENCOUNTER — Emergency Department (HOSPITAL_BASED_OUTPATIENT_CLINIC_OR_DEPARTMENT_OTHER)
Admission: EM | Admit: 2017-10-29 | Discharge: 2017-10-29 | Disposition: A | Discharge status: Home / Self Care | Payer: Self-pay | Attending: Emergency Medicine | Admitting: Emergency Medicine

## 2017-10-29 ENCOUNTER — Emergency Department (HOSPITAL_BASED_OUTPATIENT_CLINIC_OR_DEPARTMENT_OTHER): Payer: Self-pay

## 2017-10-29 ENCOUNTER — Other Ambulatory Visit: Payer: Self-pay

## 2017-10-29 DIAGNOSIS — F419 Anxiety disorder, unspecified: Secondary | ICD-10-CM | POA: Insufficient documentation

## 2017-10-29 DIAGNOSIS — F17200 Nicotine dependence, unspecified, uncomplicated: Secondary | ICD-10-CM | POA: Insufficient documentation

## 2017-10-29 DIAGNOSIS — Y929 Unspecified place or not applicable: Secondary | ICD-10-CM | POA: Insufficient documentation

## 2017-10-29 DIAGNOSIS — F329 Major depressive disorder, single episode, unspecified: Secondary | ICD-10-CM | POA: Insufficient documentation

## 2017-10-29 DIAGNOSIS — Z79899 Other long term (current) drug therapy: Secondary | ICD-10-CM | POA: Insufficient documentation

## 2017-10-29 DIAGNOSIS — S92501A Displaced unspecified fracture of right lesser toe(s), initial encounter for closed fracture: Secondary | ICD-10-CM

## 2017-10-29 DIAGNOSIS — Y998 Other external cause status: Secondary | ICD-10-CM | POA: Insufficient documentation

## 2017-10-29 DIAGNOSIS — S9031XA Contusion of right foot, initial encounter: Secondary | ICD-10-CM

## 2017-10-29 DIAGNOSIS — S92511A Displaced fracture of proximal phalanx of right lesser toe(s), initial encounter for closed fracture: Secondary | ICD-10-CM | POA: Insufficient documentation

## 2017-10-29 DIAGNOSIS — Y9389 Activity, other specified: Secondary | ICD-10-CM | POA: Insufficient documentation

## 2017-10-29 DIAGNOSIS — W2203XA Walked into furniture, initial encounter: Secondary | ICD-10-CM | POA: Insufficient documentation

## 2017-10-29 DIAGNOSIS — J45909 Unspecified asthma, uncomplicated: Secondary | ICD-10-CM | POA: Insufficient documentation

## 2017-10-29 DIAGNOSIS — Z91010 Allergy to peanuts: Secondary | ICD-10-CM | POA: Insufficient documentation

## 2017-10-29 NOTE — ED Triage Notes (Signed)
Pt hit right foot on wooden couch 20 minutes prior to arrival, no evidence of trauma, no bruising, no swelling

## 2017-10-29 NOTE — Discharge Instructions (Addendum)
You may alternate Tylenol 1000 mg every 6 hours as needed for pain and Ibuprofen 800 mg every 8 hours as needed for pain.  Please take Ibuprofen with food. ° ° ° °To find a primary care or specialty doctor please call [phone] or [phone] to access "[facility] Find a Doctor Service." ° °You may also go on the [facility] website at [email or web address] ° °There are also multiple [facility], [facility] and [facility] practices throughout the [place] that are frequently accepting new patients. You may find a clinic that is close to your home and contact them. ° ° [facility] -  °[address] °[place] [address] °[phone] ° ° °[facility] -  °[address] °[place] [address] [phone] ° ° °[facility] - °[address]  °[place] [address] °[phone] ° ° °

## 2017-10-29 NOTE — ED Provider Notes (Signed)
TIME SEEN: 2:55 AM  CHIEF COMPLAINT: Right foot injury  HPI: Patient is a 22 year old female who presents to the emergency department with right foot injury that occurred just prior to arrival.  Reports that she stubbed her third, fourth and fifth right toes on the leg of a couch.  Has been able to ambulate but states it is painful to do so.  Did not fall to the ground or strike her head.  No numbness but does have pain with wiggling the toes.  No other injury.  ROS: See HPI Constitutional: no fever  Eyes: no drainage  ENT: no runny nose   Cardiovascular:  no chest pain  Resp: no SOB  GI: no vomiting GU: no dysuria Integumentary: no rash  Allergy: no hives  Musculoskeletal: no leg swelling  Neurological: no slurred speech ROS otherwise negative  PAST MEDICAL HISTORY/PAST SURGICAL HISTORY:  Past Medical History:  Diagnosis Date  . Allergy   . Anxiety   . Asthma   . Depression   . Eczema   . Eczema   . Ovarian cyst    resolved    MEDICATIONS:  Prior to Admission medications   Medication Sig Start Date End Date Taking? Authorizing Provider  metroNIDAZOLE (FLAGYL) 500 MG tablet Take 1 tablet (500 mg total) by mouth 2 (two) times daily. 07/22/17   Law, Waylan Boga, PA-C  naproxen (NAPROSYN) 500 MG tablet Take 1 tablet (500 mg total) by mouth 2 (two) times daily. 10/09/17   Glynn Octave, MD    ALLERGIES:  Allergies  Allergen Reactions  . Dairy Aid [employer]       . Peanut (Diagnostic)   . Penicillins Rash    .Marland KitchenHas patient had a PCN reaction causing immediate rash, facial/tongue/throat swelling, SOB or lightheadedness with hypotension: No Has patient had a PCN reaction causing severe rash involving mucus membranes or skin necrosis: No Has patient had a PCN reaction that required hospitalization No Has patient had a PCN reaction occurring within the last 10 years: No If all of the above answers are "NO", then may proceed with Cephalosporin use.     SOCIAL  HISTORY:  Social History   Tobacco Use  . Smoking status: Current Every Day Smoker    Packs/day: 0.50    Years: 0.00    Pack years: 0.00  . Smokeless tobacco: Never Used  Substance Use Topics  . Alcohol use: No    FAMILY HISTORY: Family History  Problem Relation Age of Onset  . Asthma Mother   . Bipolar disorder Mother   . Asthma Father   . Hypertension Father   . Bipolar disorder Father   . Hypertension Paternal Grandmother   . Diabetes Paternal Grandmother   . Asthma Paternal Grandmother     EXAM: BP 113/73 (BP Location: Right Arm)   Pulse 90   Temp 98.4 F (36.9 C) (Oral)   Resp 16   Ht  (1.6 m)   Wt 63.5 kg (140 lb)   LMP 10/11/2017   SpO2 100%   BMI 24.80 kg/m  CONSTITUTIONAL: Alert and oriented and responds appropriately to questions. Well-appearing; well-nourished HEAD: Normocephalic EYES: Conjunctivae clear, pupils appear equal, EOMI ENT: normal nose; moist mucous membranes NECK: Supple, no meningismus, no nuchal rigidity, no LAD  CARD: RRR; S1 and S2 appreciated; no murmurs, no clicks, no rubs, no gallops RESP: Normal chest excursion without splinting or tachypnea; breath sounds clear and equal bilaterally; no wheezes, no rhonchi, no rales, no hypoxia or respiratory  distress, speaking full sentences ABD/GI: Normal bowel sounds; non-distended; soft, non-tender, no rebound, no guarding, no peritoneal signs, no hepatosplenomegaly BACK:  The back appears normal and is non-tender to palpation, there is no CVA tenderness EXT: Tender to palpation over the third, fourth and fifth right toes without deformity, ecchymosis or swelling.  Normal capillary refill.  No other tenderness throughout the right foot.  No tenderness over the ankle or proximal tibia of the right leg.  Compartments are soft.  2+ right DP pulse.  Normal ROM in all joints; otherwise extremities are non-tender to palpation; no edema; normal capillary refill; no cyanosis, no calf tenderness or  swelling    SKIN: Normal color for age and race; warm; no rash NEURO: Moves all extremities equally normal sensation throughout the right leg PSYCH: The patient's mood and manner are appropriate. Grooming and personal hygiene are appropriate.  MEDICAL DECISION MAKING: Patient here with right foot injury.  She does appear to have a proximal oblique mildly displaced fourth toe fracture.  We have buddy tape this toe to the third toe and placed her in a postop shoe and given crutches for comfort.  Offered Tylenol and Motrin here which she declines.  Will discharge with PCP follow-up information.  She does not need to see an orthopedist as we have administered definitive fracture care here in the emergency department.  No other injury on exam.  Neurovascularly intact distally.  At this time, I do not feel there is any life-threatening condition present. I have reviewed and discussed all results (EKG, imaging, lab, urine as appropriate) and exam findings with patient/family. I have reviewed nursing notes and appropriate previous records.  I feel the patient is safe to be discharged home without further emergent workup and can continue workup as an outpatient as needed. Discussed usual and customary return precautions. Patient/family verbalize understanding and are comfortable with this plan.  Outpatient follow-up has been provided if needed. All questions have been answered.     Ward, Layla Maw, DO 10/29/17 [phone]

## 2017-10-30 ENCOUNTER — Encounter (HOSPITAL_BASED_OUTPATIENT_CLINIC_OR_DEPARTMENT_OTHER): Payer: Self-pay

## 2017-10-30 ENCOUNTER — Other Ambulatory Visit: Payer: Self-pay

## 2017-10-30 ENCOUNTER — Emergency Department (HOSPITAL_BASED_OUTPATIENT_CLINIC_OR_DEPARTMENT_OTHER)
Admission: EM | Admit: 2017-10-30 | Discharge: 2017-10-30 | Disposition: A | Discharge status: Home / Self Care | Payer: Medicaid Other | Attending: Emergency Medicine | Admitting: Emergency Medicine

## 2017-10-30 DIAGNOSIS — J45909 Unspecified asthma, uncomplicated: Secondary | ICD-10-CM | POA: Insufficient documentation

## 2017-10-30 DIAGNOSIS — Z91010 Allergy to peanuts: Secondary | ICD-10-CM | POA: Insufficient documentation

## 2017-10-30 DIAGNOSIS — X58XXXD Exposure to other specified factors, subsequent encounter: Secondary | ICD-10-CM | POA: Insufficient documentation

## 2017-10-30 DIAGNOSIS — S92511D Displaced fracture of proximal phalanx of right lesser toe(s), subsequent encounter for fracture with routine healing: Secondary | ICD-10-CM | POA: Insufficient documentation

## 2017-10-30 DIAGNOSIS — F17210 Nicotine dependence, cigarettes, uncomplicated: Secondary | ICD-10-CM | POA: Insufficient documentation

## 2017-10-30 MED ORDER — IBUPROFEN 400 MG PO TABS: 400.0000 mg | ORAL_TABLET | Freq: Three times a day (TID) | ORAL | 0 refills | Status: DC | PRN

## 2017-10-30 MED ORDER — HYDROCODONE-ACETAMINOPHEN 5-325 MG PO TABS
1.0000 | ORAL_TABLET | Freq: Once | ORAL | Status: AC
  Administered 2017-10-30: 1 via ORAL
  Filled 2017-10-30: qty 1

## 2017-10-30 MED ORDER — HYDROCODONE-ACETAMINOPHEN 5-325 MG PO TABS: 1.0000 | ORAL_TABLET | Freq: Four times a day (QID) | ORAL | 0 refills | Status: DC | PRN

## 2017-10-30 NOTE — ED Provider Notes (Signed)
[facility] EMERGENCY DEPARTMENT Provider Note   CSN: [id number] Arrival date & time: 10/30/17  0252     History   Chief Complaint Chief Complaint  Patient presents with  . Foot Pain    HPI Deanna [name] is a 22 y.o. female.  The history is provided by the patient.  Foot Pain  This is a recurrent problem. The problem occurs constantly. The problem has been gradually worsening. Exacerbated by: movement. The symptoms are relieved by rest.   Reports continued right foot pain.  She reports she recently broke her toe, and the pain is not improving. No new injury. Past Medical History:  Diagnosis Date  . Allergy   . Anxiety   . Asthma   . Depression   . Eczema   . Eczema   . Ovarian cyst    resolved    Patient Active Problem List   Diagnosis Date Noted  . Drug overdose   . Bipolar 1 disorder (HCC) 06/02/2016  . Bipolar affective disorder, current episode depressed (HCC) 06/02/2016  . OVARIAN CYST, RIGHT 06/30/2008  . ECZEMA, ATOPIC DERMATITIS 08/20/2006    Past Surgical History:  Procedure Laterality Date  . OVARIAN CYST REMOVAL       OB History   None      Home Medications    Prior to Admission medications   Medication Sig Start Date End Date Taking? Authorizing Provider  HYDROcodone-acetaminophen (NORCO/VICODIN) 5-325 MG tablet Take 1 tablet by mouth every 6 (six) hours as needed for severe pain (for breakthrough pain). 10/30/17   Zadie Rhine, MD  ibuprofen (ADVIL,MOTRIN) 400 MG tablet Take 1 tablet (400 mg total) by mouth every 8 (eight) hours as needed for moderate pain. 10/30/17   Zadie Rhine, MD    Family History Family History  Problem Relation Age of Onset  . Asthma Mother   . Bipolar disorder Mother   . Asthma Father   . Hypertension Father   . Bipolar disorder Father   . Hypertension Paternal Grandmother   . Diabetes Paternal Grandmother   . Asthma Paternal Grandmother     Social History Social History   Tobacco  Use  . Smoking status: Current Every Day Smoker    Packs/day: 0.50    Years: 0.00    Pack years: 0.00  . Smokeless tobacco: Never Used  Substance Use Topics  . Alcohol use: No  . Drug use: No     Allergies   Dairy aid [lactase]; Peanut (diagnostic); and Penicillins   Review of Systems Review of Systems  Musculoskeletal: Positive for arthralgias.  Psychiatric/Behavioral: The patient is nervous/anxious.      Physical Exam Updated Vital Signs BP 118/73 (BP Location: Right Arm)   Pulse 78   Temp 98.1 F (36.7 C) (Oral)   Resp 20   Ht 1.6 m ( )   Wt 66.7 kg (147 lb)   LMP 10/11/2017   SpO2 99%   BMI 26.04 kg/m   Physical Exam CONSTITUTIONAL: Well developed/well nourished, anxious HEAD: Normocephalic/atraumatic ENMT: Mucous membranes moist NECK: supple no meningeal signs ABDOMEN: soft NEURO: Pt is awake/alert/appropriate, moves all extremitiesx4   EXTREMITIES: pulses normal/equal, full ROM Tenderness to right fourth toe.  No lacerations.  No other foot tenderness noted SKIN: warm, color normal PSYCH: Anxious and tearful  ED Treatments / Results  Labs (all labs ordered are listed, but only abnormal results are displayed) Labs Reviewed - No data to display  EKG None  Radiology Dg Foot Complete Right  Result Date: 10/29/2017 CLINICAL DATA:  22 year old female with trauma to the right foot and pain over the right fourth and fifth toes. EXAM: RIGHT FOOT COMPLETE - 3+ VIEW COMPARISON:  None. FINDINGS: There is a mildly displaced oblique fracture of the proximal phalanx of the fourth digit. No dislocation. No other acute fracture noted. The soft tissues appear unremarkable. IMPRESSION: Mildly displaced oblique fracture of the proximal phalanx of the fourth digit. Electronically Signed   By: Elgie Collard M.D.   On: 10/29/2017 02:56    Procedures Procedures (including critical care time)  Medications Ordered in ED Medications  HYDROcodone-acetaminophen  (NORCO/VICODIN) 5-325 MG per tablet 1 tablet (1 tablet Oral Given 10/30/17 0423)     Initial Impression / Assessment and Plan / ED Course  I have reviewed the triage vital signs and the nursing notes.  Pertinent imaging results that were available during my care of the patient were reviewed by me and considered in my medical decision making (see chart for details).     Patient reports that she felt the buddy tape was too tight.  I have had this removed.  There is no skin breakdown or laceration Will re-place buddy tape.  Advised postop shoe with crutches.  Will refer to sports medicine next week.  Work note given.  Short course of Vicodin ordered  Narcotic database reviewed and considered in decision making Final Clinical Impressions(s) / ED Diagnoses   Final diagnoses:  Closed displaced fracture of proximal phalanx of lesser toe of right foot with routine healing, subsequent encounter    ED Discharge Orders        Ordered    ibuprofen (ADVIL,MOTRIN) 400 MG tablet  Every 8 hours PRN     10/30/17 0444    HYDROcodone-acetaminophen (NORCO/VICODIN) 5-325 MG tablet  Every 6 hours PRN     10/30/17 0444       Zadie Rhine, MD 10/30/17 [phone]

## 2017-10-30 NOTE — ED Notes (Signed)
Gave pt warm blanket, ice pack and pillow to elevate foot, informed of the wait time as well

## 2017-10-30 NOTE — ED Triage Notes (Signed)
Pt c/o continued pain from her broken toe that she was seen for last night despite OTC meds, pt requesting more for pain stating that she cannot sleep

## 2018-02-22 ENCOUNTER — Encounter (HOSPITAL_BASED_OUTPATIENT_CLINIC_OR_DEPARTMENT_OTHER): Payer: Self-pay

## 2018-02-22 ENCOUNTER — Emergency Department (HOSPITAL_BASED_OUTPATIENT_CLINIC_OR_DEPARTMENT_OTHER)
Admission: EM | Admit: 2018-02-22 | Discharge: 2018-02-22 | Disposition: A | Discharge status: Home / Self Care | Payer: Medicaid Other | Attending: Emergency Medicine | Admitting: Emergency Medicine

## 2018-02-22 ENCOUNTER — Other Ambulatory Visit: Payer: Self-pay

## 2018-02-22 DIAGNOSIS — N760 Acute vaginitis: Secondary | ICD-10-CM | POA: Insufficient documentation

## 2018-02-22 DIAGNOSIS — F17200 Nicotine dependence, unspecified, uncomplicated: Secondary | ICD-10-CM | POA: Insufficient documentation

## 2018-02-22 DIAGNOSIS — R197 Diarrhea, unspecified: Secondary | ICD-10-CM

## 2018-02-22 DIAGNOSIS — Z91010 Allergy to peanuts: Secondary | ICD-10-CM | POA: Insufficient documentation

## 2018-02-22 DIAGNOSIS — B9689 Other specified bacterial agents as the cause of diseases classified elsewhere: Secondary | ICD-10-CM | POA: Insufficient documentation

## 2018-02-22 DIAGNOSIS — N12 Tubulo-interstitial nephritis, not specified as acute or chronic: Secondary | ICD-10-CM | POA: Insufficient documentation

## 2018-02-22 DIAGNOSIS — J45909 Unspecified asthma, uncomplicated: Secondary | ICD-10-CM | POA: Insufficient documentation

## 2018-02-22 LAB — URINALYSIS, ROUTINE W REFLEX MICROSCOPIC
Bilirubin Urine: NEGATIVE
GLUCOSE, UA: NEGATIVE mg/dL
Ketones, ur: NEGATIVE mg/dL
Nitrite: NEGATIVE
PROTEIN: NEGATIVE mg/dL
Specific Gravity, Urine: >1.03 — ABNORMAL HIGH (ref 1.005–1.030)
pH: 5.5 (ref 5.0–8.0)

## 2018-02-22 LAB — CBC WITH DIFFERENTIAL/PLATELET
Basophils Absolute: 0 10*3/uL (ref 0.0–0.1)
Basophils Relative: 1 %
Eosinophils Absolute: 0.8 10*3/uL (ref 0.0–0.7)
Eosinophils Relative: 15 %
HEMATOCRIT: 38.8 % (ref 36.0–46.0)
HEMOGLOBIN: 13.4 g/dL (ref 12.0–15.0)
LYMPHS PCT: 32 %
Lymphs Abs: 1.6 10*3/uL (ref 0.7–4.0)
MCH: 30.9 pg (ref 26.0–34.0)
MCHC: 34.5 g/dL (ref 30.0–36.0)
MCV: 89.4 fL (ref 78.0–100.0)
MONOS PCT: 6 %
Monocytes Absolute: 0.3 10*3/uL (ref 0.1–1.0)
NEUTROS ABS: 2.4 10*3/uL (ref 1.7–7.7)
NEUTROS PCT: 46 %
Platelets: 162 10*3/uL (ref 150–400)
RBC: 4.34 MIL/uL (ref 3.87–5.11)
RDW: 12 % (ref 11.5–15.5)
WBC: 5.1 10*3/uL (ref 4.0–10.5)

## 2018-02-22 LAB — COMPREHENSIVE METABOLIC PANEL
ALBUMIN: 3.9 g/dL (ref 3.5–5.0)
ALT: 11 U/L (ref 0–44)
AST: 24 U/L (ref 15–41)
Alkaline Phosphatase: 38 U/L (ref 38–126)
Anion gap: 8 (ref 5–15)
BILIRUBIN TOTAL: 0.3 mg/dL (ref 0.3–1.2)
BUN: 13 mg/dL (ref 6–20)
CHLORIDE: 110 mmol/L (ref 98–111)
CO2: 24 mmol/L (ref 22–32)
Calcium: 8.7 mg/dL — ABNORMAL LOW (ref 8.9–10.3)
Creatinine, Ser: 0.79 mg/dL (ref 0.44–1.00)
GFR calc Af Amer: >60 mL/min (ref >60)
GFR calc non Af Amer: >60 mL/min (ref >60)
GLUCOSE: 84 mg/dL (ref 70–99)
POTASSIUM: 3.6 mmol/L (ref 3.5–5.1)
SODIUM: 142 mmol/L (ref 135–145)
Total Protein: 7 g/dL (ref 6.5–8.1)

## 2018-02-22 LAB — WET PREP, GENITAL
SPERM: NONE SEEN
TRICH WET PREP: NONE SEEN
Yeast Wet Prep HPF POC: NONE SEEN

## 2018-02-22 LAB — PREGNANCY, URINE: Preg Test, Ur: NEGATIVE

## 2018-02-22 LAB — URINALYSIS, MICROSCOPIC (REFLEX)

## 2018-02-22 LAB — LIPASE, BLOOD: Lipase: 28 U/L (ref 11–51)

## 2018-02-22 MED ORDER — METRONIDAZOLE 500 MG PO TABS: 500.0000 mg | ORAL_TABLET | Freq: Two times a day (BID) | ORAL | 0 refills | Status: DC

## 2018-02-22 MED ORDER — CEFTRIAXONE SODIUM 1 G IJ SOLR
1.0000 g | Freq: Once | INTRAMUSCULAR | Status: AC
  Administered 2018-02-22: 1 g via INTRAMUSCULAR
  Filled 2018-02-22: qty 10

## 2018-02-22 MED ORDER — CEPHALEXIN 500 MG PO CAPS: 500.0000 mg | ORAL_CAPSULE | Freq: Three times a day (TID) | ORAL | 0 refills | Status: AC

## 2018-02-22 NOTE — ED Provider Notes (Signed)
[facility] EMERGENCY DEPARTMENT Provider Note   CSN: [id number] Arrival date & time: 02/22/18  1250     History   Chief Complaint Chief Complaint  Patient presents with  . Flank Pain    HPI Deanna [name] is a 22 y.o. female.  Deanna [name] is a 22 y.o. Female with a history of asthma, eczema, anxiety, depression and ovarian cyst, who presents to the emergency department for evaluation of 1 week of right flank pain.  Patient reports back in July she was diagnosed with a urinary tract infection, given 1 dose of antibiotics in the ED but was unable to afford her antibiotic prescription that she was prescribed to continue taking.  Symptoms seem to go away so she did not think much of it but over the past week she is been having increasing pain over the right flank and feels that her urine has a greenish tent to it.  Patient reports she has had some nausea but no vomiting and some intermittent generalized abdominal discomfort.  Patient also reports that she has had intermittent episodes of nonbloody diarrhea over the past month but has normal stools in between.  No melena.  No focal abdominal pain today.  No fevers or chills.  She also reports some watery vaginal discharge over the past few days, she is sexually active with women and has one partner.  No vaginal bleeding.  No chest pain or shortness of breath.  She has not taken any medications to treat her symptoms prior to arrival, no other aggravating or alleviating factors.     Past Medical History:  Diagnosis Date  . Allergy   . Anxiety   . Asthma   . Depression   . Eczema   . Eczema   . Ovarian cyst    resolved    Patient Active Problem List   Diagnosis Date Noted  . Drug overdose   . Bipolar 1 disorder (HCC) 06/02/2016  . Bipolar affective disorder, current episode depressed (HCC) 06/02/2016  . OVARIAN CYST, RIGHT 06/30/2008  . ECZEMA, ATOPIC DERMATITIS 08/20/2006    Past Surgical History:  Procedure  Laterality Date  . OVARIAN CYST REMOVAL       OB History   None      Home Medications    Prior to Admission medications   Medication Sig Start Date End Date Taking? Authorizing Provider  cephALEXin (KEFLEX) 500 MG capsule Take 1 capsule (500 mg total) by mouth 3 (three) times daily for 7 days. 02/22/18 03/01/18  Dartha Lodge, PA-C  HYDROcodone-acetaminophen (NORCO/VICODIN) 5-325 MG tablet Take 1 tablet by mouth every 6 (six) hours as needed for severe pain (for breakthrough pain). 10/30/17   Zadie Rhine, MD  ibuprofen (ADVIL,MOTRIN) 400 MG tablet Take 1 tablet (400 mg total) by mouth every 8 (eight) hours as needed for moderate pain. 10/30/17   Zadie Rhine, MD  metroNIDAZOLE (FLAGYL) 500 MG tablet Take 1 tablet (500 mg total) by mouth 2 (two) times daily. One po bid x 7 days 02/22/18   Dartha Lodge, PA-C    Family History Family History  Problem Relation Age of Onset  . Asthma Mother   . Bipolar disorder Mother   . Asthma Father   . Hypertension Father   . Bipolar disorder Father   . Hypertension Paternal Grandmother   . Diabetes Paternal Grandmother   . Asthma Paternal Grandmother     Social History Social History   Tobacco Use  . Smoking status:  Current Every Day Smoker    Packs/day: 0.50    Years: 0.00    Pack years: 0.00  . Smokeless tobacco: Never Used  Substance Use Topics  . Alcohol use: No  . Drug use: No     Allergies   Dairy aid [lactase]; Peanut (diagnostic); and Penicillins   Review of Systems Review of Systems  Constitutional: Negative for chills and fever.  HENT: Negative.   Eyes: Negative for visual disturbance.  Respiratory: Negative for cough and shortness of breath.   Cardiovascular: Negative for chest pain.  Gastrointestinal: Positive for diarrhea and nausea. Negative for abdominal pain, blood in stool, constipation and vomiting.  Genitourinary: Positive for flank pain, frequency and vaginal discharge. Negative for dysuria,  hematuria, pelvic pain, vaginal bleeding and vaginal pain.  Musculoskeletal: Negative for arthralgias, back pain and myalgias.  Skin: Negative for color change and rash.  Neurological: Negative for dizziness, syncope and light-headedness.     Physical Exam Updated Vital Signs BP 107/65 (BP Location: Left Arm)   Pulse 94   Temp 98 F (36.7 C) (Oral)   Resp 18   Ht 5\' 3"  (1.6 m)   Wt 63.5 kg   LMP  (LMP Unknown)   SpO2 100%   BMI 24.80 kg/m   Physical Exam  Constitutional: She appears well-developed and well-nourished. No distress.  HENT:  Head: Normocephalic and atraumatic.  Eyes: Right eye exhibits no discharge. Left eye exhibits no discharge.  Cardiovascular: Normal rate, regular rhythm, normal heart sounds and intact distal pulses.  Pulmonary/Chest: Effort normal and breath sounds normal. No respiratory distress.  Respirations equal and unlabored, patient able to speak in full sentences, lungs clear to auscultation bilaterally  Abdominal: Soft. Bowel sounds are normal. She exhibits no distension and no mass. There is no tenderness. There is no guarding.  Abdomen soft, nondistended, nontender to palpation in all quadrants without guarding or peritoneal signs, minimal tenderness over the right flank  Genitourinary:  Genitourinary Comments: Chaperone present during pelvic exam. No external genital lesions noted. There is a moderate amount of yellowish discharge present in the vaginal vault, no bleeding.  No cervicitis.  On bimanual exam there is no cervical motion tenderness, no tenderness over the uterus or bilateral adnexa no masses palpated.  Neurological: She is alert. Coordination normal.  Skin: Skin is warm and dry. Capillary refill takes less than 2 seconds. She is not diaphoretic.  Psychiatric: She has a normal mood and affect. Her behavior is normal.  Nursing note and vitals reviewed.    ED Treatments / Results  Labs (all labs ordered are listed, but only abnormal  results are displayed) Labs Reviewed  WET PREP, GENITAL - Abnormal; Notable for the following components:      Result Value   Clue Cells Wet Prep HPF POC PRESENT (*)    WBC, Wet Prep HPF POC MANY (*)    All other components within normal limits  URINALYSIS, ROUTINE W REFLEX MICROSCOPIC - Abnormal; Notable for the following components:   Color, Urine ORANGE (*)    Specific Gravity, Urine >1.030 (*)    Hgb urine dipstick TRACE (*)    Leukocytes, UA TRACE (*)    All other components within normal limits  COMPREHENSIVE METABOLIC PANEL - Abnormal; Notable for the following components:   Calcium 8.7 (*)    All other components within normal limits  URINALYSIS, MICROSCOPIC (REFLEX) - Abnormal; Notable for the following components:   Bacteria, UA MANY (*)    All other components  within normal limits  URINE CULTURE  PREGNANCY, URINE  LIPASE, BLOOD  CBC WITH DIFFERENTIAL/PLATELET  RPR  HIV ANTIBODY (ROUTINE TESTING)  GC/CHLAMYDIA PROBE AMP ([place]) NOT AT [facility]    EKG None  Radiology No results found.  Procedures Procedures (including critical care time)  Medications Ordered in ED Medications  cefTRIAXone (ROCEPHIN) injection 1 g (1 g Intramuscular Given 02/22/18 1506)     Initial Impression / Assessment and Plan / ED Course  I have reviewed the triage vital signs and the nursing notes.  Pertinent labs & imaging results that were available during my care of the patient were reviewed by me and considered in my medical decision making (see chart for details).  Patient presents for evaluation of urinary symptoms and right flank pain as well as vaginal discharge.  On arrival vitals stable.  Abdominal exam benign with minimal tenderness over the right flank.  She has had some intermittent diarrhea this is been nonbloody, no diarrhea today.  Pelvic exam reveals moderate amount of discharge present, no cervical motion tenderness or adnexal tenderness to suggest PID.  Basic labs as  well as STD testing and urinalysis collected.  No leukocytosis and normal hemoglobin, normal renal function no acute electrolyte derangements.  Wet prep with many WBCs and clue cells present most consistent with bacterial vaginosis we will treat with Flagyl, STD testing collected and pending.  Urinalysis with trace leukocytes, microscopic reveals 21-50 WBCs and many bacteria, this concerning for infection given patient's symptoms on presentation, and it is known that she had prior UTI which was likely never completely treated.  Will give 1 dose of IM Rocephin here in the emergency department to initiate treatment for pyelonephritis and then will treat with Keflex.  Discussed this plan with the patient and she is in agreement.  Prescriptions provided for Keflex and Flagyl, urine culture is pending.  Return precautions discussed.  Patient is aware she has STD testing pending will be contacted with any positive results.  At this time I feel the patient is stable for discharge home with new treatment and follow-up with OB/GYN or primary care.  Recommend probiotics and follow-up with PCP if diarrhea persists, abdominal exam is benign today.   Final Clinical Impressions(s) / ED Diagnoses   Final diagnoses:  Pyelonephritis  BV (bacterial vaginosis)  Diarrhea, unspecified type    ED Discharge Orders         Ordered    cephALEXin (KEFLEX) 500 MG capsule  3 times daily     02/22/18 1507    metroNIDAZOLE (FLAGYL) 500 MG tablet  2 times daily     02/22/18 1507           Dartha Lodge, PA-C 02/24/18 1802    Melene Plan, DO 02/24/18 2036

## 2018-02-22 NOTE — ED Triage Notes (Signed)
C/o right flank pain, "green pee" x 1 week-abd pain, diarrhea x 1 month-NAD-steady gait

## 2018-02-22 NOTE — Discharge Instructions (Signed)
You have a urinary tract infection that has likely gone up to your kidney causing flank pain.  You are given 1 dose of antibiotics here please continue to take antibiotics as prescribed.  You also have BV, take Flagyl twice daily, do not drink alcohol while taking this medication as it will cause severe nausea and vomiting.  You have STD testing pending will be contacted in 2 to 3 days with any positive results.  For diarrhea you can take over-the-counter probiotic such as align or Culturelle.  If this persists, you develop severe abdominal pain, blood in your stools, fevers or any other symptoms follow-up with your primary care doctor return to the emergency department.

## 2018-02-23 LAB — GC/CHLAMYDIA PROBE AMP (~~LOC~~) NOT AT ARMC
Chlamydia: NEGATIVE
Neisseria Gonorrhea: NEGATIVE

## 2018-02-23 LAB — RPR: RPR Ser Ql: NONREACTIVE

## 2018-02-23 LAB — HIV ANTIBODY (ROUTINE TESTING W REFLEX): HIV Screen 4th Generation wRfx: NONREACTIVE

## 2018-02-25 LAB — URINE CULTURE: Culture: >=100000 — AB

## 2018-02-26 ENCOUNTER — Telehealth: Payer: Self-pay

## 2018-02-26 NOTE — Telephone Encounter (Signed)
Post ED Visit - Positive Culture Follow-up  Culture report reviewed by antimicrobial stewardship pharmacist:  []  Enzo Bi, Pharm.D. []  Celedonio Miyamoto, Pharm.D., BCPS AQ-ID []  Garvin Fila, Pharm.D., BCPS []  Georgina Pillion, [address].D., BCPS []  [place], [address].D., BCPS, AAHIVP []  Estella Husk, Pharm.D., BCPS, AAHIVP []  Lysle Pearl, PharmD, BCPS []  Phillips Climes, PharmD, BCPS []  Agapito Games, PharmD, BCPS []  Verlan Friends, PharmD [place]  Positive urine culture Treated with Cephalexin, organism sensitive to the same and no further patient follow-up is required at this time.  Jerry Caras 02/26/2018, 11:15 AM

## 2018-03-14 ENCOUNTER — Emergency Department (HOSPITAL_COMMUNITY)
Admission: EM | Admit: 2018-03-14 | Discharge: 2018-03-14 | Discharge status: Against Medical Advice | Payer: No Typology Code available for payment source

## 2018-03-14 NOTE — ED Notes (Signed)
Called patient to be triaged and no answer. 

## 2018-03-14 NOTE — ED Notes (Signed)
Patient called to be triage and no answer. 

## 2018-03-25 ENCOUNTER — Emergency Department (HOSPITAL_BASED_OUTPATIENT_CLINIC_OR_DEPARTMENT_OTHER)
Admission: EM | Admit: 2018-03-25 | Discharge: 2018-03-25 | Disposition: A | Discharge status: Home / Self Care | Payer: Medicaid Other | Attending: Emergency Medicine | Admitting: Emergency Medicine

## 2018-03-25 ENCOUNTER — Encounter (HOSPITAL_BASED_OUTPATIENT_CLINIC_OR_DEPARTMENT_OTHER): Payer: Self-pay | Admitting: *Deleted

## 2018-03-25 ENCOUNTER — Other Ambulatory Visit: Payer: Self-pay

## 2018-03-25 DIAGNOSIS — Z79899 Other long term (current) drug therapy: Secondary | ICD-10-CM | POA: Insufficient documentation

## 2018-03-25 DIAGNOSIS — F17210 Nicotine dependence, cigarettes, uncomplicated: Secondary | ICD-10-CM | POA: Insufficient documentation

## 2018-03-25 DIAGNOSIS — Z91010 Allergy to peanuts: Secondary | ICD-10-CM | POA: Insufficient documentation

## 2018-03-25 DIAGNOSIS — R062 Wheezing: Secondary | ICD-10-CM

## 2018-03-25 DIAGNOSIS — J45901 Unspecified asthma with (acute) exacerbation: Secondary | ICD-10-CM

## 2018-03-25 MED ORDER — PREDNISONE 50 MG PO TABS: 50.0000 mg | ORAL_TABLET | Freq: Every day | ORAL | 0 refills | Status: AC

## 2018-03-25 MED ORDER — ALBUTEROL SULFATE (2.5 MG/3ML) 0.083% IN NEBU
INHALATION_SOLUTION | RESPIRATORY_TRACT | Status: AC
  Administered 2018-03-25: 5 mg
  Filled 2018-03-25: qty 6

## 2018-03-25 MED ORDER — PREDNISONE 50 MG PO TABS
50.0000 mg | ORAL_TABLET | Freq: Once | ORAL | Status: AC
  Administered 2018-03-25: 50 mg via ORAL
  Filled 2018-03-25: qty 1

## 2018-03-25 MED ORDER — ALBUTEROL SULFATE HFA 108 (90 BASE) MCG/ACT IN AERS
2.0000 | INHALATION_SPRAY | Freq: Once | RESPIRATORY_TRACT | Status: AC
  Administered 2018-03-25: 2 via RESPIRATORY_TRACT
  Filled 2018-03-25: qty 6.7

## 2018-03-25 NOTE — ED Provider Notes (Signed)
[facility] EMERGENCY DEPARTMENT Provider Note   CSN: [id number] Arrival date & time: 03/25/18  4098     History   Chief Complaint Chief Complaint  Patient presents with  . Cough    HPI Deanna [name] is a 22 y.o. female.  The history is provided by the patient and medical records.  Cough  This is a new problem. The current episode started more than 2 days ago. The problem occurs constantly. The problem has been gradually worsening. The cough is non-productive. There has been no fever. Associated symptoms include shortness of breath and wheezing. Pertinent negatives include no chest pain, no chills, no sweats, no ear congestion, no headaches, no rhinorrhea and no sore throat. She has tried nothing (out of inhaler) for the symptoms. The treatment provided no relief. Her past medical history is significant for asthma.    Past Medical History:  Diagnosis Date  . Allergy   . Anxiety   . Asthma   . Depression   . Eczema   . Eczema   . Ovarian cyst    resolved    Patient Active Problem List   Diagnosis Date Noted  . Drug overdose   . Bipolar 1 disorder (HCC) 06/02/2016  . Bipolar affective disorder, current episode depressed (HCC) 06/02/2016  . OVARIAN CYST, RIGHT 06/30/2008  . ECZEMA, ATOPIC DERMATITIS 08/20/2006    Past Surgical History:  Procedure Laterality Date  . OVARIAN CYST REMOVAL       OB History   None      Home Medications    Prior to Admission medications   Medication Sig Start Date End Date Taking? Authorizing Provider  albuterol (PROVENTIL HFA;VENTOLIN HFA) 108 (90 Base) MCG/ACT inhaler Inhale into the lungs every 6 (six) hours as needed for wheezing or shortness of breath.   Yes [provider]  HYDROcodone-acetaminophen (NORCO/VICODIN) 5-325 MG tablet Take 1 tablet by mouth every 6 (six) hours as needed for severe pain (for breakthrough pain). 10/30/17   Zadie Rhine, MD  ibuprofen (ADVIL,MOTRIN) 400 MG tablet Take 1  tablet (400 mg total) by mouth every 8 (eight) hours as needed for moderate pain. 10/30/17   Zadie Rhine, MD  metroNIDAZOLE (FLAGYL) 500 MG tablet Take 1 tablet (500 mg total) by mouth 2 (two) times daily. One po bid x 7 days 02/22/18   Dartha Lodge, PA-C    Family History Family History  Problem Relation Age of Onset  . Asthma Mother   . Bipolar disorder Mother   . Asthma Father   . Hypertension Father   . Bipolar disorder Father   . Hypertension Paternal Grandmother   . Diabetes Paternal Grandmother   . Asthma Paternal Grandmother     Social History Social History   Tobacco Use  . Smoking status: Current Every Day Smoker    Packs/day: 0.50    Years: 0.00    Pack years: 0.00  . Smokeless tobacco: Never Used  Substance Use Topics  . Alcohol use: No  . Drug use: No     Allergies   Dairy aid [lactase]; Peanut (diagnostic); and Penicillins   Review of Systems Review of Systems  Constitutional: Negative for chills, diaphoresis, fatigue and fever.  HENT: Negative for congestion, rhinorrhea and sore throat.   Eyes: Negative for visual disturbance.  Respiratory: Positive for cough, chest tightness, shortness of breath and wheezing. Negative for choking and stridor.   Cardiovascular: Negative for chest pain.  Gastrointestinal: Negative for abdominal pain, constipation, diarrhea, nausea  and vomiting.  Genitourinary: Negative for flank pain.  Musculoskeletal: Negative for back pain, neck pain and neck stiffness.  Skin: Negative for rash and wound.  Neurological: Negative for light-headedness and headaches.  Psychiatric/Behavioral: Negative for agitation.  All other systems reviewed and are negative.    Physical Exam Updated Vital Signs Ht 5\' 3"  (1.6 m)   Wt 59 kg   LMP 03/11/2018   SpO2 100%   BMI 23.03 kg/m   Physical Exam  Constitutional: She is oriented to person, place, and time. She appears well-developed and well-nourished. No distress.  HENT:  Head:  Normocephalic and atraumatic.  Mouth/Throat: Oropharynx is clear and moist. No oropharyngeal exudate.  Eyes: Pupils are equal, round, and reactive to light. Conjunctivae and EOM are normal.  Neck: Normal range of motion. Neck supple.  Cardiovascular: Normal rate and regular rhythm.  No murmur heard. Pulmonary/Chest: Effort normal. No respiratory distress. She has wheezes. She has no rales. She exhibits no tenderness.  Abdominal: Soft. There is no tenderness.  Musculoskeletal: She exhibits no edema or tenderness.  Neurological: She is alert and oriented to person, place, and time. No sensory deficit. She exhibits normal muscle tone.  Skin: Skin is warm and dry. Capillary refill takes less than 2 seconds. No rash noted. She is not diaphoretic.  Psychiatric: She has a normal mood and affect.  Nursing note and vitals reviewed.    ED Treatments / Results  Labs (all labs ordered are listed, but only abnormal results are displayed) Labs Reviewed - No data to display  EKG None  Radiology No results found.  Procedures Procedures (including critical care time)  Medications Ordered in ED Medications  albuterol (PROVENTIL) (2.5 MG/3ML) 0.083% nebulizer solution (5 mg  Given 03/25/18 0836)  predniSONE (DELTASONE) tablet 50 mg (50 mg Oral Given 03/25/18 1014)  albuterol (PROVENTIL HFA;VENTOLIN HFA) 108 (90 Base) MCG/ACT inhaler 2 puff (2 puffs Inhalation Given 03/25/18 1018)     Initial Impression / Assessment and Plan / ED Course  I have reviewed the triage vital signs and the nursing notes.  Pertinent labs & imaging results that were available during my care of the patient were reviewed by me and considered in my medical decision making (see chart for details).     Deanna [name] is a 22 y.o. female with a past medical history significant for asthma who presents with wheezing and shortness of breath.  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  She reports her inhaler has expired and she has not used it.  She denies any fevers, chills, congestion and has had no productive cough.  She denies any sick contacts.  She denies any injuries.  She denies any chest pain.  She denies other complaints on arrival.  Next  On arrival, patient was given a breathing treatment for wheezing and cough.  After initial breathing treatment, wheezing improved.  On my exam, patient had faint wheezing in her lung fields but no rhonchi.  Chest was nontender and back was nontender.  Patient appeared well.  Patient maintained her option saturation on room air.  Next  For the volvulus, patient was given an albuterol inhaler which she used.  It further.  Her symptoms.  Patient maintain her oxygen saturations on room air and had no further coughing.  Patient was given oral steroids.  Patient will give prescription for a burst of  oral steroids for the next 5 days and will use the inhaler at home.  Patient will follow-up with a PCP for further asthma management.  Given her lack of chest pain, productive cough, or fevers, do not feel strongly that she needs a chest x-ray.  Patient did not want a chest x-ray when we discussed it.    Patient understood return precautions for any new or worsened symptoms.  Patient discharged in good condition with improved symptoms.   Final Clinical Impressions(s) / ED Diagnoses   Final diagnoses:  Exacerbation of asthma, unspecified asthma severity, unspecified whether persistent  Wheezing    ED Discharge Orders    None     Clinical Impression: 1. Exacerbation of asthma, unspecified asthma severity, unspecified whether persistent   2. Wheezing     Disposition: Discharge  Condition: Good  I have discussed the results, Dx and Tx plan with the pt(& family if present). He/she/they expressed understanding and agree(s) with the plan. Discharge instructions discussed at  great length. Strict return precautions discussed and pt &/or family have verbalized understanding of the instructions. No further questions at time of discharge.    New Prescriptions   No medications on file    Follow Up: [facility] [address] [place] [address] [phone] Schedule an appointment as soon as possible for a visit    [facility] [place] [facility] [address] [id number] [address] [place] [address] [phone]       Tegeler, Canary Brim, MD 03/25/18 1114

## 2018-03-25 NOTE — ED Notes (Addendum)
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 Unable to take deep breath without coughing. HHN given in triage.

## 2018-03-25 NOTE — ED Triage Notes (Signed)
Pt talking on cell phone and texting in waiting room in nad. Called twice and asked to put her phone away for triage assessment. Pt reports one week of cough, mostly in the mornings, non-productive. Denies any fevers or other c/o.

## 2018-03-25 NOTE — Discharge Instructions (Signed)
Your history and exam are consistent with an asthma exacerbation.  As her symptoms have improved with steroids and the breathing treatments, we feel you are safe for discharge home.  Please take the steroids for the next several days and use the inhaler we provided.  Please follow-up with your primary doctor for further management.  Please rest.  If any symptoms change or worsen, please return to the nearest emergency department.

## 2018-04-19 ENCOUNTER — Encounter: Payer: Self-pay | Admitting: Emergency Medicine

## 2018-04-19 ENCOUNTER — Emergency Department (HOSPITAL_BASED_OUTPATIENT_CLINIC_OR_DEPARTMENT_OTHER): Payer: Medicaid Other

## 2018-04-19 ENCOUNTER — Emergency Department (HOSPITAL_BASED_OUTPATIENT_CLINIC_OR_DEPARTMENT_OTHER)
Admission: EM | Admit: 2018-04-19 | Discharge: 2018-04-19 | Disposition: A | Discharge status: Home / Self Care | Payer: Medicaid Other | Attending: Emergency Medicine | Admitting: Emergency Medicine

## 2018-04-19 ENCOUNTER — Other Ambulatory Visit: Payer: Self-pay

## 2018-04-19 DIAGNOSIS — F17210 Nicotine dependence, cigarettes, uncomplicated: Secondary | ICD-10-CM | POA: Insufficient documentation

## 2018-04-19 DIAGNOSIS — R0789 Other chest pain: Secondary | ICD-10-CM | POA: Insufficient documentation

## 2018-04-19 DIAGNOSIS — J40 Bronchitis, not specified as acute or chronic: Secondary | ICD-10-CM | POA: Insufficient documentation

## 2018-04-19 DIAGNOSIS — J45909 Unspecified asthma, uncomplicated: Secondary | ICD-10-CM | POA: Insufficient documentation

## 2018-04-19 DIAGNOSIS — Z91010 Allergy to peanuts: Secondary | ICD-10-CM | POA: Insufficient documentation

## 2018-04-19 MED ORDER — AZITHROMYCIN 250 MG PO TABS
500.0000 mg | ORAL_TABLET | Freq: Once | ORAL | Status: AC
  Administered 2018-04-19: 500 mg via ORAL
  Filled 2018-04-19: qty 2

## 2018-04-19 MED ORDER — IBUPROFEN 400 MG PO TABS
600.0000 mg | ORAL_TABLET | Freq: Once | ORAL | Status: AC
  Administered 2018-04-19: 600 mg via ORAL
  Filled 2018-04-19: qty 1

## 2018-04-19 MED ORDER — IBUPROFEN 600 MG PO TABS: 600.0000 mg | ORAL_TABLET | Freq: Four times a day (QID) | ORAL | 0 refills | Status: DC | PRN

## 2018-04-19 MED ORDER — AZITHROMYCIN 250 MG PO TABS: 250.0000 mg | ORAL_TABLET | Freq: Every day | ORAL | 0 refills | Status: DC

## 2018-04-19 MED ORDER — ALBUTEROL SULFATE (2.5 MG/3ML) 0.083% IN NEBU
5.0000 mg | INHALATION_SOLUTION | Freq: Once | RESPIRATORY_TRACT | Status: AC
  Administered 2018-04-19: 5 mg via RESPIRATORY_TRACT
  Filled 2018-04-19: qty 6

## 2018-04-19 NOTE — ED Provider Notes (Signed)
[facility] EMERGENCY DEPARTMENT Provider Note   CSN: [id number] Arrival date & time: 04/19/18  1906     History   Chief Complaint Chief Complaint  Patient presents with  . Shortness of Breath    HPI Deanna [name] is a 22 y.o. female.  Pt presents to the ED today with SOB.  She has been sob for 1-2 weeks.  She was actually here on 10/3 for sob and was given an albuterol inhaler.  Pt also c/o left sided chest wall pain that hurts with movement and deep breaths.  The pt denies f/c.     Past Medical History:  Diagnosis Date  . Allergy   . Anxiety   . Asthma   . Depression   . Eczema   . Eczema   . Ovarian cyst    resolved    Patient Active Problem List   Diagnosis Date Noted  . Drug overdose   . Bipolar 1 disorder (HCC) 06/02/2016  . Bipolar affective disorder, current episode depressed (HCC) 06/02/2016  . OVARIAN CYST, RIGHT 06/30/2008  . ECZEMA, ATOPIC DERMATITIS 08/20/2006    Past Surgical History:  Procedure Laterality Date  . OVARIAN CYST REMOVAL       OB History   None      Home Medications    Prior to Admission medications   Medication Sig Start Date End Date Taking? Authorizing Provider  albuterol (PROVENTIL HFA;VENTOLIN HFA) 108 (90 Base) MCG/ACT inhaler Inhale into the lungs every 6 (six) hours as needed for wheezing or shortness of breath.   Yes [provider]  azithromycin (ZITHROMAX) 250 MG tablet Take 1 tablet (250 mg total) by mouth daily. Take first 2 tablets together, then 1 every day until finished. 04/20/18   Jacalyn Lefevre, MD  HYDROcodone-acetaminophen (NORCO/VICODIN) 5-325 MG tablet Take 1 tablet by mouth every 6 (six) hours as needed for severe pain (for breakthrough pain). 10/30/17   Zadie Rhine, MD  ibuprofen (ADVIL,MOTRIN) 600 MG tablet Take 1 tablet (600 mg total) by mouth every 6 (six) hours as needed. 04/19/18   Jacalyn Lefevre, MD  metroNIDAZOLE (FLAGYL) 500 MG tablet Take 1 tablet (500 mg total)  by mouth 2 (two) times daily. One po bid x 7 days 02/22/18   Dartha Lodge, PA-C    Family History Family History  Problem Relation Age of Onset  . Asthma Mother   . Bipolar disorder Mother   . Asthma Father   . Hypertension Father   . Bipolar disorder Father   . Hypertension Paternal Grandmother   . Diabetes Paternal Grandmother   . Asthma Paternal Grandmother     Social History Social History   Tobacco Use  . Smoking status: Current Every Day Smoker    Packs/day: 0.50    Years: 0.00    Pack years: 0.00  . Smokeless tobacco: Never Used  Substance Use Topics  . Alcohol use: No  . Drug use: No     Allergies   Dairy aid [lactase]; Peanut (diagnostic); and Penicillins   Review of Systems Review of Systems  Respiratory: Positive for shortness of breath.   Cardiovascular: Positive for chest pain.  All other systems reviewed and are negative.    Physical Exam Updated Vital Signs BP 108/78 (BP Location: Left Arm)   Pulse 77   Temp 98.3 F (36.8 C) (Oral)   Resp 20   Ht 5\' 3"  (1.6 m)   Wt 62.1 kg   LMP 04/07/2018   SpO2 95%  BMI 24.27 kg/m   Physical Exam  Constitutional: She is oriented to person, place, and time. She appears well-developed and well-nourished.  HENT:  Head: Normocephalic and atraumatic.  Mouth/Throat: Oropharynx is clear and moist.  Eyes: Pupils are equal, round, and reactive to light. EOM are normal.  Neck: Normal range of motion. Neck supple.  Cardiovascular: Normal rate and regular rhythm.  Pulmonary/Chest: Effort normal and breath sounds normal. She exhibits tenderness.    Abdominal: Soft. Bowel sounds are normal.  Musculoskeletal: Normal range of motion.       Right lower leg: Normal.       Left lower leg: Normal.  Neurological: She is alert and oriented to person, place, and time.  Skin: Skin is warm. Capillary refill takes less than 2 seconds.  Psychiatric: She has a normal mood and affect. Her behavior is normal.  Nursing  note and vitals reviewed.    ED Treatments / Results  Labs (all labs ordered are listed, but only abnormal results are displayed) Labs Reviewed - No data to display  EKG None  Radiology Dg Chest 2 View  Result Date: 04/19/2018 CLINICAL DATA:  LEFT chest pain and shortness of breath for 3 days. EXAM: CHEST - 2 VIEW COMPARISON:  Chest radiograph April 12, 2018 FINDINGS: Cardiomediastinal silhouette is normal. No pleural effusions or focal consolidations. Mild bronchitic changes. Strandy densities RIGHT lung base. Trachea projects midline and there is no pneumothorax. Soft tissue planes and included osseous structures are non-suspicious. IMPRESSION: Mild bronchitic changes and RIGHT lung base atelectasis. Electronically Signed   By: Awilda Metro M.D.   On: 04/19/2018 19:52    Procedures Procedures (including critical care time)  Medications Ordered in ED Medications  albuterol (PROVENTIL) (2.5 MG/3ML) 0.083% nebulizer solution 5 mg (5 mg Nebulization Given 04/19/18 1915)  ibuprofen (ADVIL,MOTRIN) tablet 600 mg (600 mg Oral Given 04/19/18 2051)  azithromycin (ZITHROMAX) tablet 500 mg (500 mg Oral Given 04/19/18 2050)     Initial Impression / Assessment and Plan / ED Course  I have reviewed the triage vital signs and the nursing notes.  Pertinent labs & imaging results that were available during my care of the patient were reviewed by me and considered in my medical decision making (see chart for details).    Pt does not have PNA, but sx have been going on for weeks.  She will be started on zithromax.  She knows to return if worse and f/u with pcp.  Final Clinical Impressions(s) / ED Diagnoses   Final diagnoses:  Bronchitis  Chest wall pain    ED Discharge Orders         Ordered    azithromycin (ZITHROMAX) 250 MG tablet  Daily     04/19/18 2046    ibuprofen (ADVIL,MOTRIN) 600 MG tablet  Every 6 hours PRN     04/19/18 2047           Jacalyn Lefevre,  MD 04/19/18 2100

## 2018-04-19 NOTE — ED Notes (Signed)
Patient transported to X-ray 

## 2018-04-19 NOTE — ED Triage Notes (Signed)
C/o SOB x 1 week-pain to left rib area x 3 days-worse with deep breaths-NAD-steady gait

## 2018-05-18 ENCOUNTER — Emergency Department (HOSPITAL_BASED_OUTPATIENT_CLINIC_OR_DEPARTMENT_OTHER)
Admission: EM | Admit: 2018-05-18 | Discharge: 2018-05-19 | Disposition: A | Discharge status: Home / Self Care | Payer: Medicaid Other | Attending: Emergency Medicine | Admitting: Emergency Medicine

## 2018-05-18 ENCOUNTER — Encounter (HOSPITAL_BASED_OUTPATIENT_CLINIC_OR_DEPARTMENT_OTHER): Payer: Self-pay | Admitting: *Deleted

## 2018-05-18 ENCOUNTER — Other Ambulatory Visit: Payer: Self-pay

## 2018-05-18 DIAGNOSIS — G43409 Hemiplegic migraine, not intractable, without status migrainosus: Secondary | ICD-10-CM | POA: Insufficient documentation

## 2018-05-18 DIAGNOSIS — F17210 Nicotine dependence, cigarettes, uncomplicated: Secondary | ICD-10-CM | POA: Insufficient documentation

## 2018-05-18 LAB — PREGNANCY, URINE: PREG TEST UR: NEGATIVE

## 2018-05-18 MED ORDER — DIPHENHYDRAMINE HCL 50 MG/ML IJ SOLN
25.0000 mg | Freq: Once | INTRAMUSCULAR | Status: AC
  Administered 2018-05-18: 25 mg via INTRAVENOUS
  Filled 2018-05-18: qty 1

## 2018-05-18 MED ORDER — SODIUM CHLORIDE 0.9 % IV BOLUS
1000.0000 mL | Freq: Once | INTRAVENOUS | Status: AC
  Administered 2018-05-18: 1000 mL via INTRAVENOUS

## 2018-05-18 MED ORDER — KETOROLAC TROMETHAMINE 15 MG/ML IJ SOLN
15.0000 mg | Freq: Once | INTRAMUSCULAR | Status: AC
  Administered 2018-05-18: 15 mg via INTRAVENOUS
  Filled 2018-05-18: qty 1

## 2018-05-18 MED ORDER — METOCLOPRAMIDE HCL 5 MG/ML IJ SOLN
10.0000 mg | Freq: Once | INTRAMUSCULAR | Status: AC
  Administered 2018-05-18: 10 mg via INTRAVENOUS
  Filled 2018-05-18: qty 2

## 2018-05-18 NOTE — ED Triage Notes (Signed)
Pt c/o h/a x 3 days  

## 2018-05-18 NOTE — ED Provider Notes (Signed)
[facility] Provider Note: Lowella DellJ. Lane Alka Falwell, MD, FACEP  CSN: [id number] MRN: [id number] ARRIVAL: 05/18/18 at 2232 ROOM: [facility]06/[facility]06   CHIEF COMPLAINT  Headache   HISTORY OF PRESENT ILLNESS  05/18/18 10:52 PM Deanna [name] is a 22 y.o. female who states she has a history of infrequent migraines.  She is here with a 3-day history of a headache.  The headache is located on the right side of her head with involvement of the right eye.  She denies blurred vision but does have photophobia.  She rates her headache as a 8 out of 10.  It is somewhat throbbing in nature.  She has taken ibuprofen and BC powder without relief.  She denies associated nausea and vomiting.   Past Medical History:  Diagnosis Date  . Allergy   . Anxiety   . Asthma   . Depression   . Eczema   . Ovarian cyst    resolved    Past Surgical History:  Procedure Laterality Date  . OVARIAN CYST REMOVAL      Family History  Problem Relation Age of Onset  . Asthma Mother   . Bipolar disorder Mother   . Asthma Father   . Hypertension Father   . Bipolar disorder Father   . Hypertension Paternal Grandmother   . Diabetes Paternal Grandmother   . Asthma Paternal Grandmother     Social History   Tobacco Use  . Smoking status: Current Every Day Smoker    Packs/day: 0.50    Years: 0.00    Pack years: 0.00  . Smokeless tobacco: Never Used  Substance Use Topics  . Alcohol use: No  . Drug use: No    Prior to Admission medications   Medication Sig Start Date End Date Taking? Authorizing Provider  albuterol (PROVENTIL HFA;VENTOLIN HFA) 108 (90 Base) MCG/ACT inhaler Inhale into the lungs every 6 (six) hours as needed for wheezing or shortness of breath.    [provider]  HYDROcodone-acetaminophen (NORCO/VICODIN) 5-325 MG tablet Take 1 tablet by mouth every 6 (six) hours as needed for severe pain (for breakthrough pain). 10/30/17   Zadie RhineWickline, Donald, MD  ibuprofen (ADVIL,MOTRIN) 600 MG tablet  Take 1 tablet (600 mg total) by mouth every 6 (six) hours as needed. 04/19/18   Jacalyn LefevreHaviland, Julie, MD    Allergies Dairy aid [lactase]; Peanut (diagnostic); and Penicillins   REVIEW OF SYSTEMS  Negative except as noted here or in the History of Present Illness.   PHYSICAL EXAMINATION  Initial Vital Signs Blood pressure (!) 155/77, pulse 74, temperature 98.6 F (37 C), temperature source Oral, resp. rate 18, height 5\' 3"  (1.6 m), weight 62.4 kg, last menstrual period 05/15/2018, SpO2 99 %.  Examination General: Well-developed, well-nourished female in no acute distress, interacting with cell phone; appearance consistent with age of record HENT: normocephalic; atraumatic Eyes: pupils equal, round and reactive to light; extraocular muscles intact; photophobia Neck: supple Heart: regular rate and rhythm Lungs: clear to auscultation bilaterally Abdomen: soft; nondistended; nontender; no masses or hepatosplenomegaly; bowel sounds present Extremities: No deformity; full range of motion; pulses normal Neurologic: Awake, alert and oriented; motor function intact in all extremities and symmetric; no facial droop Skin: Warm and dry Psychiatric: Normal mood and affect   RESULTS  Summary of this visit's results, reviewed by myself:   EKG Interpretation  Date/Time:    Ventricular Rate:    PR Interval:    QRS Duration:   QT Interval:    QTC Calculation:  R Axis:     Text Interpretation:        Laboratory Studies: Results for orders placed or performed during the hospital encounter of 05/18/18 (from the past 24 hour(s))  Pregnancy, urine     Status: None   Collection Time: 05/18/18 11:05 PM  Result Value Ref Range   Preg Test, Ur NEGATIVE NEGATIVE   Imaging Studies: No results found.  ED COURSE and MDM  Nursing notes and initial vitals signs, including pulse oximetry, reviewed.  Vitals:   05/18/18 2238 05/18/18 2239  BP: (!) 155/77   Pulse: 74   Resp: 18   Temp: 98.6 F  (37 C)   TempSrc: Oral   SpO2: 99%   Weight:  62.4 kg  Height:  5\' 3"  (1.6 m)   1:09 AM Symptoms significantly improved after IV fluids and medications.  Ready to go home.  PROCEDURES    ED DIAGNOSES     ICD-10-CM   1. Sporadic migraine G43.409        Shuayb Schepers, MD 05/19/18 [phone]

## 2018-05-18 NOTE — ED Notes (Signed)
Pt reports a headache for 3 days. Pt states she has tried ibuprofen and BC  Powders but neither one has worked. Pt reports light sensitivity as well.

## 2018-05-19 NOTE — ED Notes (Signed)
Patient verbalizes understanding of discharge instructions. Opportunity for questioning and answers were provided. Armband removed by staff, pt discharged from ED home via POV.  

## 2018-12-26 IMAGING — CR DG WRIST COMPLETE 3+V*R*
4 series · 4 of 4 positions shown · non-contrast
Comparison: Right wrist radiograph 11/02/2013

CLINICAL DATA: Distal radius wrist pain after falling

EXAM:
RIGHT WRIST - COMPLETE 3+ VIEW

[x wrist pa right]
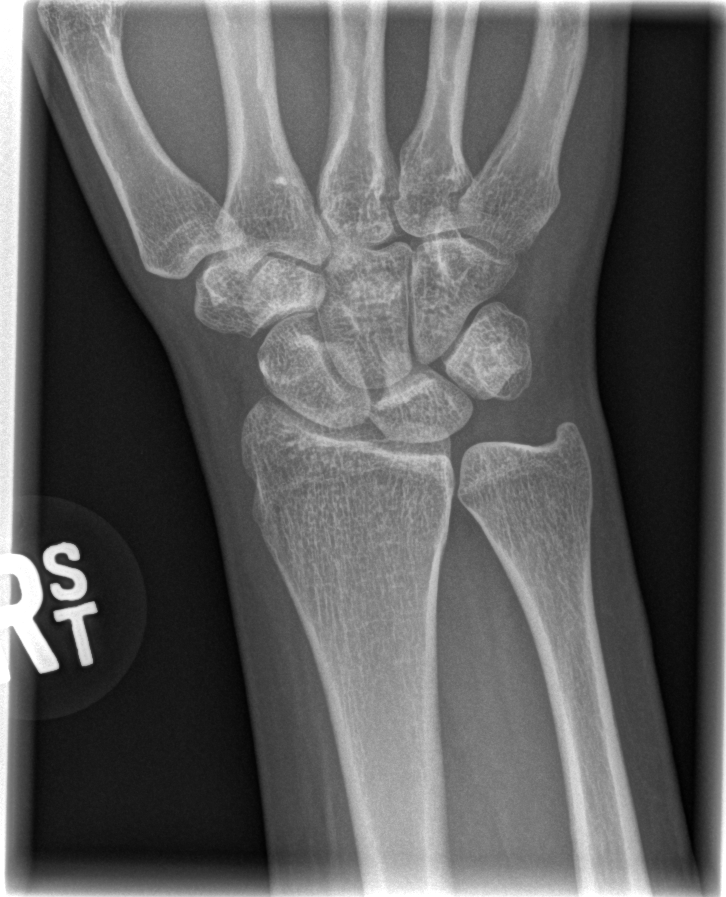

[x wrist obl right]
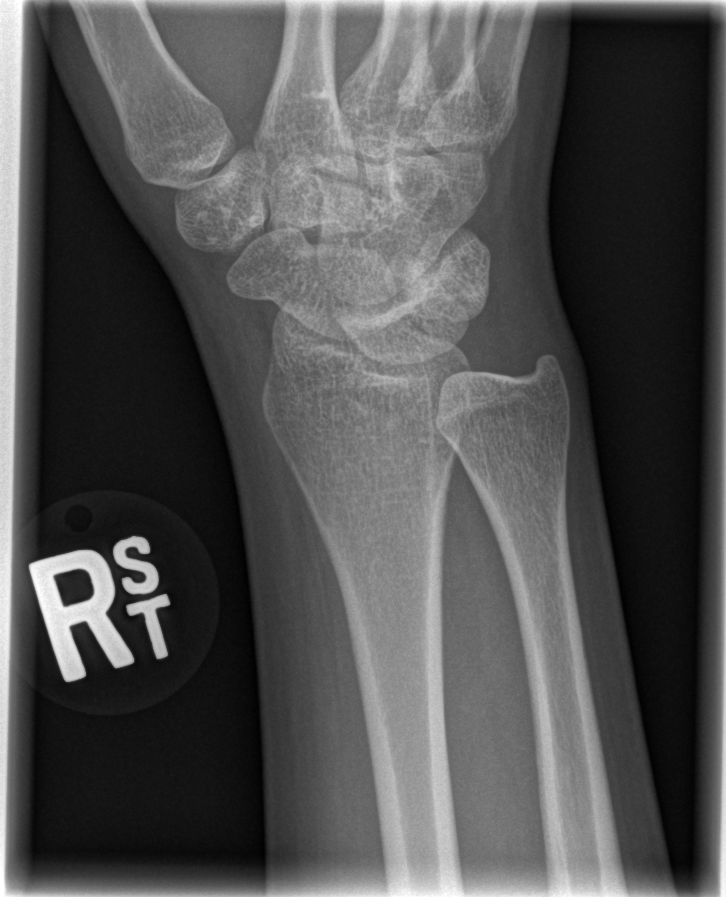

[x wrist lat right]
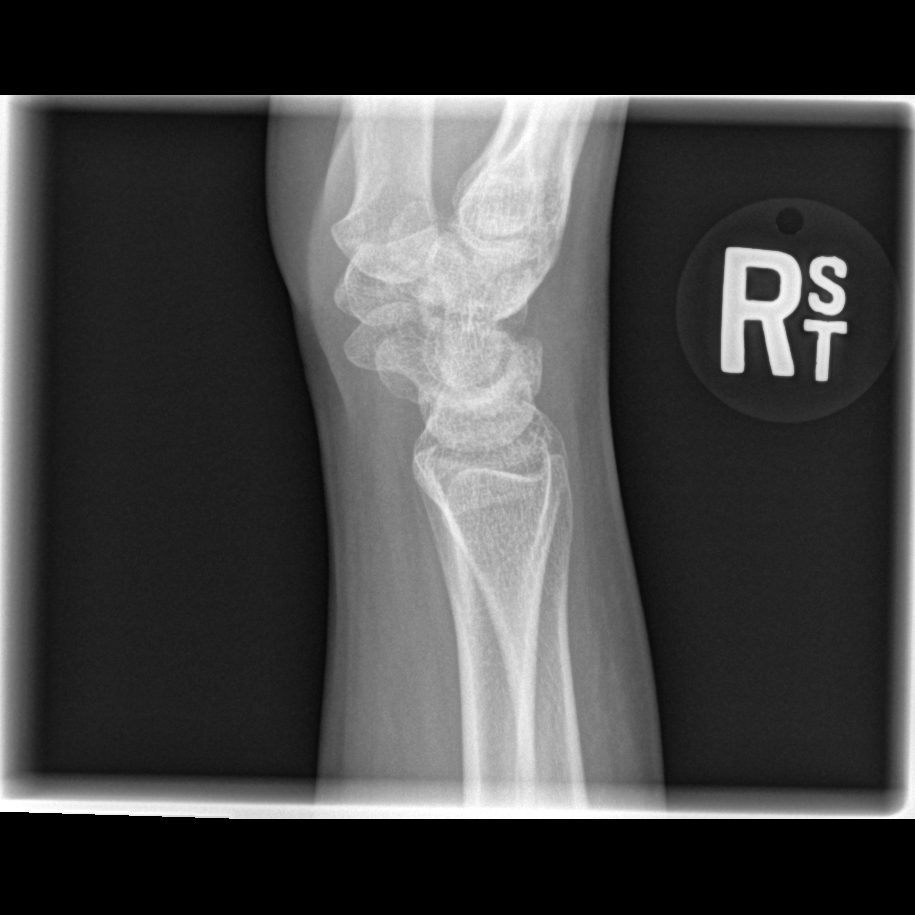

[x navicular]
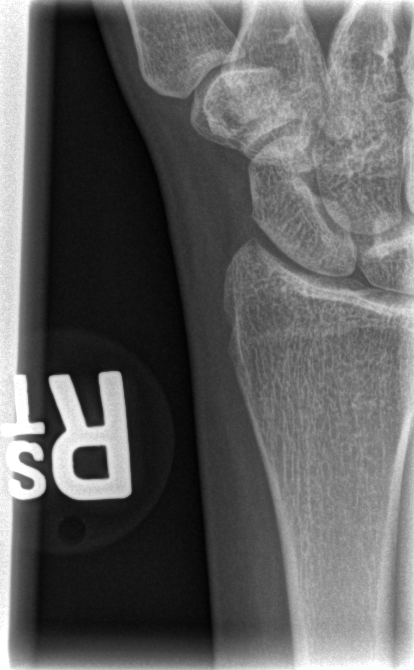

[4 of 4 positions shown; findings below may reference images not displayed]

FINDINGS: There is no evidence of fracture or dislocation. There is no
evidence of arthropathy or other focal bone abnormality. Soft
tissues are unremarkable.
IMPRESSION: No acute fracture or dislocation of the right wrist.

## 2019-04-10 ENCOUNTER — Other Ambulatory Visit: Payer: Self-pay

## 2019-04-10 ENCOUNTER — Emergency Department (HOSPITAL_BASED_OUTPATIENT_CLINIC_OR_DEPARTMENT_OTHER)
Admission: EM | Admit: 2019-04-10 | Discharge: 2019-04-10 | Disposition: A | Discharge status: Home / Self Care | Payer: Medicaid Other | Attending: Emergency Medicine | Admitting: Emergency Medicine

## 2019-04-10 ENCOUNTER — Encounter (HOSPITAL_BASED_OUTPATIENT_CLINIC_OR_DEPARTMENT_OTHER): Payer: Self-pay | Admitting: *Deleted

## 2019-04-10 DIAGNOSIS — L259 Unspecified contact dermatitis, unspecified cause: Secondary | ICD-10-CM | POA: Insufficient documentation

## 2019-04-10 DIAGNOSIS — J309 Allergic rhinitis, unspecified: Secondary | ICD-10-CM | POA: Insufficient documentation

## 2019-04-10 DIAGNOSIS — F17210 Nicotine dependence, cigarettes, uncomplicated: Secondary | ICD-10-CM | POA: Insufficient documentation

## 2019-04-10 DIAGNOSIS — Z91010 Allergy to peanuts: Secondary | ICD-10-CM | POA: Insufficient documentation

## 2019-04-10 DIAGNOSIS — Z79899 Other long term (current) drug therapy: Secondary | ICD-10-CM | POA: Insufficient documentation

## 2019-04-10 MED ORDER — CETIRIZINE HCL 10 MG PO TABS: 10.0000 mg | ORAL_TABLET | Freq: Every day | ORAL | 0 refills | Status: DC

## 2019-04-10 MED ORDER — TRIAMCINOLONE ACETONIDE 0.1 % EX CREA: 1.0000 "application " | TOPICAL_CREAM | Freq: Two times a day (BID) | CUTANEOUS | 0 refills | Status: DC

## 2019-04-10 MED ORDER — HYDROCORTISONE 1 % EX CREA: TOPICAL_CREAM | CUTANEOUS | 0 refills | Status: DC

## 2019-04-10 NOTE — ED Provider Notes (Signed)
[facility] EMERGENCY DEPARTMENT Provider Note   CSN: [id number] Arrival date & time: 04/10/19  1741     History   Chief Complaint Chief Complaint  Patient presents with  . Nasal Congestion    HPI Deanna [name] is a 23 y.o. female.     Patient is a 23 year old female who presents with nasal congestion.  She does have a history of asthma and eczema.  She states over the last 5 days she has had some nasal congestion and rhinorrhea with itchy eyes and itchy throat.  She also has eczema on her neck which has flared up over the last couple weeks.  She denies any other rash.  No facial swelling.  No lip or tongue swelling.  No shortness of breath or wheezing.  No fevers.  No productive cough.  No recent allergic exposures that she knows of.  She is taken Benadryl twice without improvement in symptoms.     Past Medical History:  Diagnosis Date  . Allergy   . Anxiety   . Asthma   . Depression   . Eczema   . Ovarian cyst    resolved    Patient Active Problem List   Diagnosis Date Noted  . Drug overdose   . Bipolar 1 disorder (HCC) 06/02/2016  . Bipolar affective disorder, current episode depressed (HCC) 06/02/2016  . OVARIAN CYST, RIGHT 06/30/2008  . ECZEMA, ATOPIC DERMATITIS 08/20/2006    Past Surgical History:  Procedure Laterality Date  . OVARIAN CYST REMOVAL       OB History   No obstetric history on file.      Home Medications    Prior to Admission medications   Medication Sig Start Date End Date Taking? Authorizing Provider  albuterol (PROVENTIL HFA;VENTOLIN HFA) 108 (90 Base) MCG/ACT inhaler Inhale into the lungs every 6 (six) hours as needed for wheezing or shortness of breath.    [provider]  cetirizine (ZYRTEC ALLERGY) 10 MG tablet Take 1 tablet (10 mg total) by mouth daily. 04/10/19   Rolan Bucco, MD  HYDROcodone-acetaminophen (NORCO/VICODIN) 5-325 MG tablet Take 1 tablet by mouth every 6 (six) hours as needed for severe  pain (for breakthrough pain). 10/30/17   Zadie Rhine, MD  hydrocortisone cream 1 % Apply to affected area 2 times daily 04/10/19   Rolan Bucco, MD  ibuprofen (ADVIL,MOTRIN) 600 MG tablet Take 1 tablet (600 mg total) by mouth every 6 (six) hours as needed. 04/19/18   Jacalyn Lefevre, MD    Family History Family History  Problem Relation Age of Onset  . Asthma Mother   . Bipolar disorder Mother   . Asthma Father   . Hypertension Father   . Bipolar disorder Father   . Hypertension Paternal Grandmother   . Diabetes Paternal Grandmother   . Asthma Paternal Grandmother     Social History Social History   Tobacco Use  . Smoking status: Current Every Day Smoker    Packs/day: 0.50    Years: 0.00    Pack years: 0.00  . Smokeless tobacco: Never Used  Substance Use Topics  . Alcohol use: No  . Drug use: No     Allergies   Dairy aid [lactase], Peanut (diagnostic), and Penicillins   Review of Systems Review of Systems  Constitutional: Negative for chills, diaphoresis, fatigue and fever.  HENT: Positive for postnasal drip and rhinorrhea. Negative for congestion, facial swelling and sneezing.   Eyes: Positive for itching.  Respiratory: Negative for cough, chest tightness  and shortness of breath.   Cardiovascular: Negative for chest pain and leg swelling.  Gastrointestinal: Negative for abdominal pain, blood in stool, diarrhea, nausea and vomiting.  Genitourinary: Negative for difficulty urinating, flank pain, frequency and hematuria.  Musculoskeletal: Negative for arthralgias and back pain.  Skin: Positive for rash.  Neurological: Negative for dizziness, speech difficulty, weakness, numbness and headaches.     Physical Exam Updated Vital Signs BP 119/76 (BP Location: Right Arm)   Pulse 95   Temp 98.8 F (37.1 C) (Oral)   Resp 18   Ht 5\' 3"  (1.6 m)   Wt 73.5 kg   LMP 04/04/2019 (Approximate)   SpO2 100%   BMI 28.70 kg/m   Physical Exam Constitutional:       Appearance: She is well-developed.  HENT:     Head: Normocephalic and atraumatic.     Right Ear: Tympanic membrane normal.     Left Ear: Tympanic membrane normal.     Mouth/Throat:     Mouth: Mucous membranes are moist.     Pharynx: Oropharynx is clear. No oropharyngeal exudate or posterior oropharyngeal erythema.  Eyes:     Pupils: Pupils are equal, round, and reactive to light.  Neck:     Musculoskeletal: Normal range of motion and neck supple.  Cardiovascular:     Rate and Rhythm: Normal rate and regular rhythm.     Heart sounds: Normal heart sounds.  Pulmonary:     Effort: Pulmonary effort is normal. No respiratory distress.     Breath sounds: Normal breath sounds. No wheezing or rales.  Chest:     Chest wall: No tenderness.  Abdominal:     General: Bowel sounds are normal.     Palpations: Abdomen is soft.     Tenderness: There is no abdominal tenderness. There is no guarding or rebound.  Musculoskeletal: Normal range of motion.  Lymphadenopathy:     Cervical: No cervical adenopathy.  Skin:    General: Skin is warm and dry.     Findings: Rash present.     Comments: Positive raised eczematous type rash to her neck without any suggestions of overlying infection, no drainage  Neurological:     Mental Status: She is alert and oriented to person, place, and time.      ED Treatments / Results  Labs (all labs ordered are listed, but only abnormal results are displayed) Labs Reviewed - No data to display  EKG None  Radiology No results found.  Procedures Procedures (including critical care time)  Medications Ordered in ED Medications - No data to display   Initial Impression / Assessment and Plan / ED Course  I have reviewed the triage vital signs and the nursing notes.  Pertinent labs & imaging results that were available during my care of the patient were reviewed by me and considered in my medical decision making (see chart for details).        Patient  presents with rhinorrhea, itchy eyes and a flareup of her eczema.  She does not have any angioedema.  No wheezing.  Her lungs are clear without suggestions of pneumonia.  She was started on Zyrtec and hydrocortisone cream for her rash.  Return precautions were given.  Final Clinical Impressions(s) / ED Diagnoses   Final diagnoses:  Allergic rhinitis, unspecified seasonality, unspecified trigger    ED Discharge Orders         Ordered    cetirizine (ZYRTEC ALLERGY) 10 MG tablet  Daily  04/10/19 1831    hydrocortisone cream 1 %     04/10/19 1831           Malvin Johns, MD 04/10/19 [id number]

## 2019-04-10 NOTE — ED Triage Notes (Signed)
Pt reports 5 days of itchy throat, congestion, post nasal drip, watery eyes, ears "draining". Hx of eczema which is also flaring up at this time

## 2021-01-25 ENCOUNTER — Other Ambulatory Visit: Payer: Self-pay

## 2021-01-25 ENCOUNTER — Emergency Department (HOSPITAL_BASED_OUTPATIENT_CLINIC_OR_DEPARTMENT_OTHER)
Admission: EM | Admit: 2021-01-25 | Discharge: 2021-01-25 | Disposition: A | Discharge status: Home / Self Care | Payer: Medicaid Other | Attending: Emergency Medicine | Admitting: Emergency Medicine

## 2021-01-25 ENCOUNTER — Encounter (HOSPITAL_BASED_OUTPATIENT_CLINIC_OR_DEPARTMENT_OTHER): Payer: Self-pay | Admitting: *Deleted

## 2021-01-25 DIAGNOSIS — R42 Dizziness and giddiness: Secondary | ICD-10-CM | POA: Insufficient documentation

## 2021-01-25 DIAGNOSIS — F17210 Nicotine dependence, cigarettes, uncomplicated: Secondary | ICD-10-CM | POA: Insufficient documentation

## 2021-01-25 DIAGNOSIS — R102 Pelvic and perineal pain: Secondary | ICD-10-CM | POA: Insufficient documentation

## 2021-01-25 DIAGNOSIS — N939 Abnormal uterine and vaginal bleeding, unspecified: Secondary | ICD-10-CM | POA: Insufficient documentation

## 2021-01-25 LAB — CBC WITH DIFFERENTIAL/PLATELET
Abs Immature Granulocytes: 0.01 10*3/uL (ref 0.00–0.07)
Basophils Absolute: 0.1 10*3/uL (ref 0.0–0.1)
Basophils Relative: 1 %
Eosinophils Absolute: 0.4 10*3/uL (ref 0.0–0.5)
Eosinophils Relative: 7 %
HCT: 40.2 % (ref 36.0–46.0)
Hemoglobin: 13.6 g/dL (ref 12.0–15.0)
Immature Granulocytes: 0 %
Lymphocytes Relative: 42 %
Lymphs Abs: 2.7 10*3/uL (ref 0.7–4.0)
MCH: 30.4 pg (ref 26.0–34.0)
MCHC: 33.8 g/dL (ref 30.0–36.0)
MCV: 89.9 fL (ref 80.0–100.0)
Monocytes Absolute: 0.3 10*3/uL (ref 0.1–1.0)
Monocytes Relative: 4 %
Neutro Abs: 2.9 10*3/uL (ref 1.7–7.7)
Neutrophils Relative %: 46 %
Platelets: 229 10*3/uL (ref 150–400)
RBC: 4.47 MIL/uL (ref 3.87–5.11)
RDW: 12 % (ref 11.5–15.5)
WBC: 6.3 10*3/uL (ref 4.0–10.5)
nRBC: 0 % (ref 0.0–0.2)

## 2021-01-25 LAB — COMPREHENSIVE METABOLIC PANEL
ALT: 18 U/L (ref 0–44)
AST: 18 U/L (ref 15–41)
Albumin: 4.6 g/dL (ref 3.5–5.0)
Alkaline Phosphatase: 52 U/L (ref 38–126)
Anion gap: 9 (ref 5–15)
BUN: 14 mg/dL (ref 6–20)
CO2: 27 mmol/L (ref 22–32)
Calcium: 9.2 mg/dL (ref 8.9–10.3)
Chloride: 103 mmol/L (ref 98–111)
Creatinine, Ser: 0.99 mg/dL (ref 0.44–1.00)
GFR, Estimated: >60 mL/min (ref >60)
Glucose, Bld: 97 mg/dL (ref 70–99)
Potassium: 3.6 mmol/L (ref 3.5–5.1)
Sodium: 139 mmol/L (ref 135–145)
Total Bilirubin: 0.4 mg/dL (ref 0.3–1.2)
Total Protein: 8 g/dL (ref 6.5–8.1)

## 2021-01-25 LAB — URINALYSIS, MICROSCOPIC (REFLEX)

## 2021-01-25 LAB — URINALYSIS, ROUTINE W REFLEX MICROSCOPIC
Bilirubin Urine: NEGATIVE
Glucose, UA: NEGATIVE mg/dL
Ketones, ur: NEGATIVE mg/dL
Leukocytes,Ua: NEGATIVE
Nitrite: NEGATIVE
Protein, ur: NEGATIVE mg/dL
Specific Gravity, Urine: >1.03 — ABNORMAL HIGH (ref 1.005–1.030)
pH: 5.5 (ref 5.0–8.0)

## 2021-01-25 LAB — WET PREP, GENITAL
Clue Cells Wet Prep HPF POC: NONE SEEN
Sperm: NONE SEEN
Trich, Wet Prep: NONE SEEN
Yeast Wet Prep HPF POC: NONE SEEN

## 2021-01-25 LAB — PREGNANCY, URINE: Preg Test, Ur: NEGATIVE

## 2021-01-25 LAB — LIPASE, BLOOD: Lipase: 30 U/L (ref 11–51)

## 2021-01-25 NOTE — Discharge Instructions (Addendum)
It was a pleasure taking care of you today.  As discussed, all of your labs awere reassuring today.  I have included the number of the OB/GYN.  Please call Monday to schedule an appointment for further evaluation.  Return to the ER for new or worsening symptoms.

## 2021-01-25 NOTE — ED Provider Notes (Signed)
[facility] EMERGENCY DEPARTMENT Provider Note   CSN: [id number] Arrival date & time: 01/25/21  1458     History Chief Complaint  Patient presents with   Vaginal Bleeding    KIMYATTA LECY is a 25 y.o. female with a past medical history significant for bipolar affective disorder, anxiety, depression, and asthma who presents to the ED due to vaginal bleeding x10 days.  Patient admits to irregular menses for the past few months.  She admits to a heavy menstrual cycle for the past 10 days.  She endorses passing some clots. Uses 3-4 super tampons daily. She is not currently on any birth control. She will be starting testosterone shots soon. Vaginal bleeding associated with lower abdominal cramping.  Denies vaginal discharge and dysuria. She is currently sexually activity with 1 female partner. No concern for STDs. She admits to feeling lightheaded earlier today and had a near syncopal episode. No syncope. Denies chest pain and palpitations. No shortness of breath. She is not currently followed by OBGYN. No treatment prior to arrival. No aggravating or alleviating symptoms.  History obtained from patient and past medical records. No interpreter used during encounter.       Past Medical History:  Diagnosis Date   Allergy    Anxiety    Asthma    Depression    Eczema    Ovarian cyst    resolved    Patient Active Problem List   Diagnosis Date Noted   Drug overdose    Bipolar 1 disorder (HCC) 06/02/2016   Bipolar affective disorder, current episode depressed (HCC) 06/02/2016   OVARIAN CYST, RIGHT 06/30/2008   ECZEMA, ATOPIC DERMATITIS 08/20/2006    Past Surgical History:  Procedure Laterality Date   OVARIAN CYST REMOVAL       OB History   No obstetric history on file.     Family History  Problem Relation Age of Onset   Asthma Mother    Bipolar disorder Mother    Asthma Father    Hypertension Father    Bipolar disorder Father    Hypertension Paternal  Grandmother    Diabetes Paternal Grandmother    Asthma Paternal Grandmother     Social History   Tobacco Use   Smoking status: Every Day    Packs/day: 0.50    Years: 0.00    Pack years: 0.00    Types: Cigarettes   Smokeless tobacco: Never  Vaping Use   Vaping Use: Never used  Substance Use Topics   Alcohol use: No   Drug use: No    Home Medications Prior to Admission medications   Medication Sig Start Date End Date Taking? Authorizing Provider  mirtazapine (REMERON) 15 MG tablet Take 15 mg by mouth at bedtime.   Yes [provider]  albuterol (PROVENTIL HFA;VENTOLIN HFA) 108 (90 Base) MCG/ACT inhaler Inhale into the lungs every 6 (six) hours as needed for wheezing or shortness of breath.    [provider]  cetirizine (ZYRTEC ALLERGY) 10 MG tablet Take 1 tablet (10 mg total) by mouth daily. 04/10/19   Rolan Bucco, MD  HYDROcodone-acetaminophen (NORCO/VICODIN) 5-325 MG tablet Take 1 tablet by mouth every 6 (six) hours as needed for severe pain (for breakthrough pain). 10/30/17   Zadie Rhine, MD  hydrocortisone cream 1 % Apply to affected area 2 times daily 04/10/19   Rolan Bucco, MD  ibuprofen (ADVIL,MOTRIN) 600 MG tablet Take 1 tablet (600 mg total) by mouth every 6 (six) hours as needed. 04/19/18  Jacalyn Lefevre, MD  triamcinolone cream (KENALOG) 0.1 % Apply 1 application topically 2 (two) times daily. 04/10/19   Rolan Bucco, MD    Allergies    Dairy aid [lactase], Peanut (diagnostic), and Penicillins  Review of Systems   Review of Systems  Constitutional:  Negative for chills and fever.  Respiratory:  Negative for shortness of breath.   Cardiovascular:  Negative for chest pain.  Gastrointestinal:  Positive for abdominal pain. Negative for diarrhea, nausea and vomiting.  Genitourinary:  Positive for vaginal bleeding. Negative for dysuria and vaginal discharge.  Neurological:  Positive for light-headedness (resolved).  All other systems  reviewed and are negative.  Physical Exam Updated Vital Signs BP 124/66 (BP Location: Right Arm)   Pulse 60   Temp 98 F (36.7 C) (Oral)   Resp 20   Ht 5\' 3"  (1.6 m)   Wt 83.2 kg   LMP 01/16/2021   SpO2 100%   BMI 32.51 kg/m   Physical Exam Vitals and nursing note reviewed.  Constitutional:      General: She is not in acute distress.    Appearance: She is not ill-appearing.  HENT:     Head: Normocephalic.  Eyes:     Pupils: Pupils are equal, round, and reactive to light.  Cardiovascular:     Rate and Rhythm: Normal rate and regular rhythm.     Pulses: Normal pulses.     Heart sounds: Normal heart sounds. No murmur heard.   No friction rub. No gallop.  Pulmonary:     Effort: Pulmonary effort is normal.     Breath sounds: Normal breath sounds.  Abdominal:     General: Abdomen is flat. There is no distension.     Palpations: Abdomen is soft.     Tenderness: There is abdominal tenderness. There is no guarding or rebound.     Comments: Mild lower extremity edema without rebound or guarding.  Genitourinary:    Comments: Pelvic exam performed with chaperone in room.  Mild amount of blood in vaginal vault.  No CMT.  No adnexal tenderness or masses. Musculoskeletal:        General: Normal range of motion.     Cervical back: Neck supple.  Skin:    General: Skin is warm and dry.  Neurological:     General: No focal deficit present.     Mental Status: She is alert.  Psychiatric:        Mood and Affect: Mood normal.        Behavior: Behavior normal.    ED Results / Procedures / Treatments   Labs (all labs ordered are listed, but only abnormal results are displayed) Labs Reviewed  WET PREP, GENITAL - Abnormal; Notable for the following components:      Result Value   WBC, Wet Prep HPF POC MANY (*)    All other components within normal limits  URINALYSIS, ROUTINE W REFLEX MICROSCOPIC - Abnormal; Notable for the following components:   Specific Gravity, Urine >1.030 (*)     Hgb urine dipstick SMALL (*)    All other components within normal limits  URINALYSIS, MICROSCOPIC (REFLEX) - Abnormal; Notable for the following components:   Bacteria, UA FEW (*)    All other components within normal limits  CBC WITH DIFFERENTIAL/PLATELET  PREGNANCY, URINE  COMPREHENSIVE METABOLIC PANEL  LIPASE, BLOOD  GC/CHLAMYDIA PROBE AMP ([place]) NOT AT [facility]    EKG None  Radiology No results found.  Procedures Procedures   Medications Ordered  in ED Medications - No data to display  ED Course  I have reviewed the triage vital signs and the nursing notes.  Pertinent labs & imaging results that were available during my care of the patient were reviewed by me and considered in my medical decision making (see chart for details).    MDM Rules/Calculators/A&P                          25 year old female presents to the ED due to vaginal bleeding x10 days.  She endorses passing some clots.  Had a near syncopal episode today.  No history of heavy menses.  She is currently on birth control.  Not followed by OB/GYN.  Upon arrival, vitals all within normal limits.  Patient is afebrile, not tachycardic or hypoxic.  Patient in no acute distress.  Physical exam significant for mild lower abdominal tenderness without rebound or guarding.  Low suspicion for acute abdomen.  CBC, UA, pregnancy test ordered at triage.  Added CMP and lipase due to abdominal pain.   CBC unremarkable with normal hemoglobin.  No leukocytosis.  Pregnancy test negative.  UA significant for small hematuria and few bacteria.  No leukocytes or nitrites.  Low suspicion for acute cystitis.  Pelvic exam performed with chaperone in room.  Mild amount of blood in the vaginal vault.  No CMT.  Low suspicion for PID.  No adnexal tenderness or masses.  Wet prep positive for many white blood cells however, negative for yeast, trichomonas, clue cells.  Given patient's vitals have remained stable and hemoglobin in normal with  reassuring pelvic exam, do not feel megace is needed at this point. OBGYN number given to patient at discharge.  Advised patient to call Monday to schedule appoint for further evaluation. Strict ED precautions discussed with patient. Patient states understanding and agrees to plan. Patient discharged home in no acute distress and stable vitals  Final Clinical Impression(s) / ED Diagnoses Final diagnoses:  Vaginal bleeding    Rx / DC Orders ED Discharge Orders     None        Jesusita Oka 01/25/21 Atlee Abide, MD 02/01/21 1609

## 2021-01-25 NOTE — ED Triage Notes (Signed)
States she has had her menses for 10 days. Today at work in [employer] she almost passed out.

## 2021-02-18 ENCOUNTER — Encounter (HOSPITAL_BASED_OUTPATIENT_CLINIC_OR_DEPARTMENT_OTHER): Payer: Self-pay | Admitting: Emergency Medicine

## 2021-02-18 ENCOUNTER — Emergency Department (HOSPITAL_BASED_OUTPATIENT_CLINIC_OR_DEPARTMENT_OTHER)
Admission: EM | Admit: 2021-02-18 | Discharge: 2021-02-18 | Disposition: A | Discharge status: Home / Self Care | Payer: Medicaid Other | Attending: Emergency Medicine | Admitting: Emergency Medicine

## 2021-02-18 ENCOUNTER — Other Ambulatory Visit: Payer: Self-pay

## 2021-02-18 DIAGNOSIS — F17210 Nicotine dependence, cigarettes, uncomplicated: Secondary | ICD-10-CM | POA: Insufficient documentation

## 2021-02-18 DIAGNOSIS — J069 Acute upper respiratory infection, unspecified: Secondary | ICD-10-CM

## 2021-02-18 DIAGNOSIS — Z91010 Allergy to peanuts: Secondary | ICD-10-CM | POA: Insufficient documentation

## 2021-02-18 DIAGNOSIS — J45909 Unspecified asthma, uncomplicated: Secondary | ICD-10-CM | POA: Insufficient documentation

## 2021-02-18 DIAGNOSIS — R21 Rash and other nonspecific skin eruption: Secondary | ICD-10-CM

## 2021-02-18 DIAGNOSIS — R062 Wheezing: Secondary | ICD-10-CM | POA: Diagnosis not present

## 2021-02-18 MED ORDER — HYDROCORTISONE 1 % EX CREA: TOPICAL_CREAM | CUTANEOUS | 0 refills | Status: DC

## 2021-02-18 MED ORDER — ALBUTEROL SULFATE HFA 108 (90 BASE) MCG/ACT IN AERS
2.0000 | INHALATION_SPRAY | Freq: Once | RESPIRATORY_TRACT | Status: AC
  Administered 2021-02-18: 2 via RESPIRATORY_TRACT
  Filled 2021-02-18: qty 6.7

## 2021-02-18 NOTE — Discharge Instructions (Addendum)
The plaques on your shoulders and chest are concerning for psoriasis.  Recommend a 1% hydrocortisone cream.  Do not recommend systemic steroids.  We will provide a referral for follow-up with a dermatologist.

## 2021-02-18 NOTE — ED Triage Notes (Signed)
Patient complaining of tightness in chest, difficulty breathing, skin rashing to both arms and trunk. States has asthma, but does not use daily asthma medication or an inhaler.

## 2021-02-18 NOTE — ED Provider Notes (Signed)
[facility] EMERGENCY DEPARTMENT Provider Note   CSN: [id number] Arrival date & time: 02/18/21  0820     History Chief Complaint  Patient presents with   Shortness of Breath    Deanna [name] is a 25 y.o. female.  The history is provided by the patient.  Shortness of Breath Severity:  Mild Onset quality:  Gradual Timing:  Intermittent Chronicity:  New Context: URI   Ineffective treatments:  None tried Associated symptoms: cough, rash, sore throat and wheezing   Associated symptoms: no abdominal pain, no chest pain, no diaphoresis, no fever, no neck pain and no vomiting       Past Medical History:  Diagnosis Date   Allergy    Anxiety    Asthma    Depression    Eczema    Ovarian cyst    resolved    Patient Active Problem List   Diagnosis Date Noted   Drug overdose    Bipolar 1 disorder (HCC) 06/02/2016   Bipolar affective disorder, current episode depressed (HCC) 06/02/2016   OVARIAN CYST, RIGHT 06/30/2008   ECZEMA, ATOPIC DERMATITIS 08/20/2006    Past Surgical History:  Procedure Laterality Date   OVARIAN CYST REMOVAL       OB History   No obstetric history on file.     Family History  Problem Relation Age of Onset   Asthma Mother    Bipolar disorder Mother    Asthma Father    Hypertension Father    Bipolar disorder Father    Hypertension Paternal Grandmother    Diabetes Paternal Grandmother    Asthma Paternal Grandmother     Social History   Tobacco Use   Smoking status: Every Day    Packs/day: 0.50    Years: 0.00    Pack years: 0.00    Types: Cigarettes   Smokeless tobacco: Never  Vaping Use   Vaping Use: Never used  Substance Use Topics   Alcohol use: No   Drug use: No    Home Medications Prior to Admission medications   Medication Sig Start Date End Date Taking? Authorizing Provider  hydrocortisone cream 1 % Apply to affected area 2 times daily for 7-10 days 02/18/21  Yes Ernie Avena, MD  albuterol (PROVENTIL  HFA;VENTOLIN HFA) 108 (90 Base) MCG/ACT inhaler Inhale into the lungs every 6 (six) hours as needed for wheezing or shortness of breath.    [provider]  cetirizine (ZYRTEC ALLERGY) 10 MG tablet Take 1 tablet (10 mg total) by mouth daily. 04/10/19   Rolan Bucco, MD  HYDROcodone-acetaminophen (NORCO/VICODIN) 5-325 MG tablet Take 1 tablet by mouth every 6 (six) hours as needed for severe pain (for breakthrough pain). 10/30/17   Zadie Rhine, MD  ibuprofen (ADVIL,MOTRIN) 600 MG tablet Take 1 tablet (600 mg total) by mouth every 6 (six) hours as needed. 04/19/18   Jacalyn Lefevre, MD  mirtazapine (REMERON) 15 MG tablet Take 15 mg by mouth at bedtime.    [provider]  triamcinolone cream (KENALOG) 0.1 % Apply 1 application topically 2 (two) times daily. 04/10/19   Rolan Bucco, MD    Allergies    Dairy aid [lactase], Peanut (diagnostic), and Penicillins  Review of Systems   Review of Systems  Constitutional:  Negative for diaphoresis and fever.  HENT:  Positive for sore throat.   Respiratory:  Positive for cough, chest tightness, shortness of breath and wheezing.   Cardiovascular:  Negative for chest pain.  Gastrointestinal:  Negative for abdominal  pain and vomiting.  Musculoskeletal:  Negative for neck pain.  Skin:  Positive for rash.  All other systems reviewed and are negative.  Physical Exam Updated Vital Signs BP 121/75 (BP Location: Right Arm)   Pulse 73   Temp 98.5 F (36.9 C) (Oral)   Resp 16   Ht 5\' 3"  (1.6 m)   Wt 83.2 kg   LMP 02/04/2021 (Approximate)   SpO2 99%   BMI 32.51 kg/m   Physical Exam Vitals and nursing note reviewed.  Constitutional:      General: She is not in acute distress.    Appearance: She is well-developed.  HENT:     Head: Normocephalic and atraumatic.  Eyes:     Conjunctiva/sclera: Conjunctivae normal.  Cardiovascular:     Rate and Rhythm: Normal rate and regular rhythm.     Heart sounds: No murmur  heard. Pulmonary:     Effort: Pulmonary effort is normal. No respiratory distress.     Breath sounds: Normal breath sounds.  Abdominal:     Palpations: Abdomen is soft.     Tenderness: There is no abdominal tenderness.  Musculoskeletal:     Cervical back: Neck supple.  Skin:    General: Skin is warm and dry.     Comments: Scaling plaques along the patient's extensor surfaces and chest wall  Neurological:     Mental Status: She is alert.    ED Results / Procedures / Treatments   Labs (all labs ordered are listed, but only abnormal results are displayed) Labs Reviewed - No data to display  EKG None  Radiology No results found.  Procedures Procedures   Medications Ordered in ED Medications  albuterol (VENTOLIN HFA) 108 (90 Base) MCG/ACT inhaler 2 puff (2 puffs Inhalation Given 02/18/21 0941)    ED Course  I have reviewed the triage vital signs and the nursing notes.  Pertinent labs & imaging results that were available during my care of the patient were reviewed by me and considered in my medical decision making (see chart for details).    MDM Rules/Calculators/A&P                           Deanna [name] is a 25 y.o. female with significant PMH of asthma, who presents to the ED with concerns for an asthma exacerbation. On arrival, well appearing without respiratory distress. Afebrile, hemodynamically stable. SpO2 > 92% on RA. PE as above, notable for mild expiratory wheezing.  Patient appears to be having mild wheezing with an associated viral URI. Symptoms have been present for two days Do not feel that CXR is necessary at this time.  Given decadron and albuterol inhaler with relief.   On re-evaluation, patient had improvement of wheezing, good air movement, decreased work of breathing. I do not feel that the patient requires admission for asthma exacerbation at this time.    Patient stable for discharge home. Discussed return precautions to the ED. Will need to  establish care with a PCP, with a plan for dermatology referral at some point given concern for psoriasis. Parents expressed understanding, no questions or concerns at time of discharge.  Final Clinical Impression(s) / ED Diagnoses Final diagnoses:  Wheezing  Skin rash  Upper respiratory tract infection, unspecified type    Rx / DC Orders ED Discharge Orders          Ordered    hydrocortisone cream 1 %  02/18/21 3267             Ernie Avena, MD 02/18/21 [phone]

## 2021-05-08 ENCOUNTER — Emergency Department (HOSPITAL_BASED_OUTPATIENT_CLINIC_OR_DEPARTMENT_OTHER)
Admission: EM | Admit: 2021-05-08 | Discharge: 2021-05-08 | Disposition: A | Discharge status: Home / Self Care | Payer: Medicaid Other | Attending: Emergency Medicine | Admitting: Emergency Medicine

## 2021-05-08 ENCOUNTER — Encounter (HOSPITAL_BASED_OUTPATIENT_CLINIC_OR_DEPARTMENT_OTHER): Payer: Self-pay

## 2021-05-08 ENCOUNTER — Other Ambulatory Visit: Payer: Self-pay

## 2021-05-08 DIAGNOSIS — J45909 Unspecified asthma, uncomplicated: Secondary | ICD-10-CM | POA: Insufficient documentation

## 2021-05-08 DIAGNOSIS — Z91010 Allergy to peanuts: Secondary | ICD-10-CM | POA: Insufficient documentation

## 2021-05-08 DIAGNOSIS — R067 Sneezing: Secondary | ICD-10-CM | POA: Insufficient documentation

## 2021-05-08 DIAGNOSIS — Z20822 Contact with and (suspected) exposure to covid-19: Secondary | ICD-10-CM | POA: Insufficient documentation

## 2021-05-08 DIAGNOSIS — R0781 Pleurodynia: Secondary | ICD-10-CM | POA: Insufficient documentation

## 2021-05-08 DIAGNOSIS — F17210 Nicotine dependence, cigarettes, uncomplicated: Secondary | ICD-10-CM | POA: Insufficient documentation

## 2021-05-08 DIAGNOSIS — M791 Myalgia, unspecified site: Secondary | ICD-10-CM | POA: Insufficient documentation

## 2021-05-08 DIAGNOSIS — Z7952 Long term (current) use of systemic steroids: Secondary | ICD-10-CM | POA: Insufficient documentation

## 2021-05-08 DIAGNOSIS — R21 Rash and other nonspecific skin eruption: Secondary | ICD-10-CM | POA: Insufficient documentation

## 2021-05-08 LAB — RESP PANEL BY RT-PCR (FLU A&B, COVID) ARPGX2
Influenza A by PCR: NEGATIVE
Influenza B by PCR: NEGATIVE
SARS Coronavirus 2 by RT PCR: NEGATIVE

## 2021-05-08 MED ORDER — CETIRIZINE HCL 10 MG PO TABS
10.0000 mg | ORAL_TABLET | Freq: Every day | ORAL | 0 refills | Status: DC
Start: 2021-05-08 — End: 2023-03-19

## 2021-05-08 MED ORDER — TRIAMCINOLONE ACETONIDE 0.5 % EX OINT: 1.0000 "application " | TOPICAL_OINTMENT | Freq: Two times a day (BID) | CUTANEOUS | 0 refills | Status: DC

## 2021-05-08 MED ORDER — FLUTICASONE PROPIONATE 50 MCG/ACT NA SUSP: 2.0000 | Freq: Every day | NASAL | 0 refills | Status: DC

## 2021-05-08 NOTE — Discharge Instructions (Addendum)
Please use the Flonase twice daily 2 sprays in each nostril when it back towards the back of your head rather than vertically up your nose   Use zyrtec daily (use a 25 mg dose of benadryl tonight before bed).   Use triamcinolone and follow up with a dermatologist

## 2021-05-08 NOTE — ED Triage Notes (Signed)
Pt c/o flu like sx x 2 days-NAD-steady gait 

## 2021-05-08 NOTE — ED Notes (Signed)
Discharge papers reviewed, all questions/concerns addressed at this time. Pt given doctors note per request and educated on how to access her mychart for resp panel results.

## 2021-05-08 NOTE — ED Provider Notes (Signed)
[facility] EMERGENCY DEPARTMENT Provider Note   CSN: [id number] Arrival date & time: 05/08/21  5366     History Chief Complaint  Patient presents with   Generalized Body Aches    Deanna [name] is a 25 y.o. female.  HPI Patient is a 25 year old female with past medical history significant for allergic rhinitis, anxiety, asthma, depression, eczema  She is presented to the emergency room today with complaints of sneezing for the past few days.  She denies any cough hemoptysis chest pain shortness of breath lightheadedness dizziness.  She states that she does occasionally feel some rib pain when she coughs but states that it does not hurt in her stomach/anterior chest.  Rather down near the bottom of her ribs.  She denies any nausea vomiting diarrhea  She also states that she has a history of eczema and that she is on of steroid ointment and feels that both of her arms are irritated consistent with her prior eczema.  She has never seen a dermatologist for this.     Past Medical History:  Diagnosis Date   Allergy    Anxiety    Asthma    Depression    Eczema    Ovarian cyst    resolved    Patient Active Problem List   Diagnosis Date Noted   Drug overdose    Bipolar 1 disorder (HCC) 06/02/2016   Bipolar affective disorder, current episode depressed (HCC) 06/02/2016   OVARIAN CYST, RIGHT 06/30/2008   ECZEMA, ATOPIC DERMATITIS 08/20/2006    Past Surgical History:  Procedure Laterality Date   OVARIAN CYST REMOVAL       OB History   No obstetric history on file.     Family History  Problem Relation Age of Onset   Asthma Mother    Bipolar disorder Mother    Asthma Father    Hypertension Father    Bipolar disorder Father    Hypertension Paternal Grandmother    Diabetes Paternal Grandmother    Asthma Paternal Grandmother     Social History   Tobacco Use   Smoking status: Every Day    Packs/day: 0.50    Years: 0.00    Pack years: 0.00     Types: Cigarettes   Smokeless tobacco: Never  Vaping Use   Vaping Use: Every day  Substance Use Topics   Alcohol use: No   Drug use: No    Home Medications Prior to Admission medications   Medication Sig Start Date End Date Taking? Authorizing Provider  fluticasone (FLONASE) 50 MCG/ACT nasal spray Place 2 sprays into both nostrils daily for 14 days. 05/08/21 05/22/21 Yes Olinda Nola S, PA  triamcinolone ointment (KENALOG) 0.5 % Apply 1 application topically 2 (two) times daily. 05/08/21  Yes Cally Nygard S, PA  albuterol (PROVENTIL HFA;VENTOLIN HFA) 108 (90 Base) MCG/ACT inhaler Inhale into the lungs every 6 (six) hours as needed for wheezing or shortness of breath.    [provider]  cetirizine (ZYRTEC ALLERGY) 10 MG tablet Take 1 tablet (10 mg total) by mouth daily. 05/08/21   Gailen Shelter, PA  HYDROcodone-acetaminophen (NORCO/VICODIN) 5-325 MG tablet Take 1 tablet by mouth every 6 (six) hours as needed for severe pain (for breakthrough pain). 10/30/17   Zadie Rhine, MD  hydrocortisone cream 1 % Apply to affected area 2 times daily for 7-10 days 02/18/21   Ernie Avena, MD  ibuprofen (ADVIL,MOTRIN) 600 MG tablet Take 1 tablet (600 mg total) by mouth every 6 (  six) hours as needed. 04/19/18   Jacalyn Lefevre, MD  mirtazapine (REMERON) 15 MG tablet Take 15 mg by mouth at bedtime.    [provider]    Allergies    Dairy aid [tilactase], Peanut (diagnostic), and Penicillins  Review of Systems   Review of Systems  Constitutional:  Negative for chills and fever.  HENT:  Positive for rhinorrhea. Negative for congestion.   Eyes:  Negative for pain.  Respiratory:  Negative for cough and shortness of breath.        Sneezing  Cardiovascular:  Negative for chest pain and leg swelling.  Gastrointestinal:  Negative for abdominal pain, diarrhea, nausea and vomiting.  Genitourinary:  Negative for dysuria.  Musculoskeletal:  Negative for myalgias.  Skin:   Positive for wound. Negative for rash.  Neurological:  Negative for dizziness and headaches.   Physical Exam Updated Vital Signs BP 125/80 (BP Location: Right Arm)   Pulse 85   Temp 98.7 F (37.1 C) (Oral)   Resp 16   Ht 5\' 3"  (1.6 m)   Wt 87.5 kg   LMP 03/25/2021   SpO2 99%   BMI 34.19 kg/m   Physical Exam Vitals and nursing note reviewed.  Constitutional:      General: She is not in acute distress. HENT:     Head: Normocephalic and atraumatic.     Nose: Nose normal.  Eyes:     General: No scleral icterus. Cardiovascular:     Rate and Rhythm: Normal rate and regular rhythm.     Pulses: Normal pulses.     Heart sounds: Normal heart sounds.  Pulmonary:     Effort: Pulmonary effort is normal. No respiratory distress.     Breath sounds: No wheezing.     Comments: Speaking full sentences, no coughing sneezing, lungs are clear to auscultation all fields Abdominal:     Palpations: Abdomen is soft.     Tenderness: There is no abdominal tenderness. There is no guarding or rebound.  Musculoskeletal:     Cervical back: Normal range of motion.     Right lower leg: No edema.     Left lower leg: No edema.  Skin:    General: Skin is warm and dry.     Capillary Refill: Capillary refill takes less than 2 seconds.     Comments: Plaque-like raised areas of skin to bilateral arms primarily on the flexor surface but also wrapping around to the extensor surface of the elbow  Neurological:     Mental Status: She is alert. Mental status is at baseline.  Psychiatric:        Mood and Affect: Mood normal.        Behavior: Behavior normal.    ED Results / Procedures / Treatments   Labs (all labs ordered are listed, but only abnormal results are displayed) Labs Reviewed  RESP PANEL BY RT-PCR (FLU A&B, COVID) [id number]    EKG None  Radiology No results found.  Procedures Procedures   Medications Ordered in ED Medications - No data to display  ED Course  I have reviewed the  triage vital signs and the nursing notes.  Pertinent labs & imaging results that were available during my care of the patient were reviewed by me and considered in my medical decision making (see chart for details).    MDM Rules/Calculators/A&P  Patient here primarily concerned about sneezing she has a history of allergic rhinitis does not take any antihistamines or used Flonase she also has a history of eczema is complaining of worsening eczematous plaques on her arms states that she is not using any topical steroids.  COVID/influenza negative.  Patient is well-appearing normal vital signs recommend following up with dermatology we will prescribe her some triamcinolone in the meantime to use for her RNs.  Also Zyrtec and Flonase prescribed.  Well-appearing on my examination.  Final Clinical Impression(s) / ED Diagnoses Final diagnoses:  Sneezing  Rash    Rx / DC Orders ED Discharge Orders          Ordered    cetirizine (ZYRTEC ALLERGY) 10 MG tablet  Daily        05/08/21 2150    triamcinolone ointment (KENALOG) 0.5 %  2 times daily        05/08/21 2150    fluticasone (FLONASE) 50 MCG/ACT nasal spray  Daily        05/08/21 2150             Gailen Shelter, [place] 05/08/21 2159    Milagros Loll, MD 05/08/21 [phone]

## 2021-05-13 ENCOUNTER — Emergency Department (HOSPITAL_BASED_OUTPATIENT_CLINIC_OR_DEPARTMENT_OTHER): Payer: Self-pay

## 2021-05-13 ENCOUNTER — Encounter (HOSPITAL_BASED_OUTPATIENT_CLINIC_OR_DEPARTMENT_OTHER): Payer: Self-pay

## 2021-05-13 ENCOUNTER — Other Ambulatory Visit: Payer: Self-pay

## 2021-05-13 ENCOUNTER — Emergency Department (HOSPITAL_BASED_OUTPATIENT_CLINIC_OR_DEPARTMENT_OTHER)
Admission: EM | Admit: 2021-05-13 | Discharge: 2021-05-13 | Disposition: A | Discharge status: Home / Self Care | Payer: Self-pay | Attending: Student | Admitting: Student

## 2021-05-13 DIAGNOSIS — S0990XA Unspecified injury of head, initial encounter: Secondary | ICD-10-CM | POA: Insufficient documentation

## 2021-05-13 DIAGNOSIS — Z91010 Allergy to peanuts: Secondary | ICD-10-CM | POA: Insufficient documentation

## 2021-05-13 DIAGNOSIS — F17210 Nicotine dependence, cigarettes, uncomplicated: Secondary | ICD-10-CM | POA: Insufficient documentation

## 2021-05-13 DIAGNOSIS — Z7951 Long term (current) use of inhaled steroids: Secondary | ICD-10-CM | POA: Insufficient documentation

## 2021-05-13 DIAGNOSIS — R202 Paresthesia of skin: Secondary | ICD-10-CM | POA: Insufficient documentation

## 2021-05-13 DIAGNOSIS — Y92009 Unspecified place in unspecified non-institutional (private) residence as the place of occurrence of the external cause: Secondary | ICD-10-CM | POA: Insufficient documentation

## 2021-05-13 DIAGNOSIS — H538 Other visual disturbances: Secondary | ICD-10-CM | POA: Insufficient documentation

## 2021-05-13 DIAGNOSIS — J45909 Unspecified asthma, uncomplicated: Secondary | ICD-10-CM | POA: Insufficient documentation

## 2021-05-13 DIAGNOSIS — W01198A Fall on same level from slipping, tripping and stumbling with subsequent striking against other object, initial encounter: Secondary | ICD-10-CM | POA: Insufficient documentation

## 2021-05-13 DIAGNOSIS — H53149 Visual discomfort, unspecified: Secondary | ICD-10-CM | POA: Insufficient documentation

## 2021-05-13 NOTE — ED Triage Notes (Signed)
Pt states fell and hit her head on couch on Saturday. C/o dizziness intermittently, with nausea when heavy lifting. Sensitivity to light and nausea.

## 2021-05-13 NOTE — ED Provider Notes (Signed)
[facility] EMERGENCY DEPARTMENT Provider Note   CSN: [id number] Arrival date & time: 05/13/21  1645     History Chief Complaint  Patient presents with   Head Injury    SYLIVA MEE is a 25 y.o. female who presents the emergency department with a head injury that occurred on Saturday.  Patient states she tripped and fell face forward hitting her forehead against the wood part of her couch at home.  She denies any loss of consciousness after the event.  Her partner did not witness any seizure-like activity afterwards.  Since the event she has been more dazed with dizziness that is worse with various position changes.  She reports associated photophobia in the left eye and blurred vision bilaterally.  The symptoms seem to have gotten worse when she started back at work today.  She denies any weakness to her extremities.  She does complain of bilateral numbness to the upper arms.    Head Injury     Past Medical History:  Diagnosis Date   Allergy    Anxiety    Asthma    Depression    Eczema    Ovarian cyst    resolved    Patient Active Problem List   Diagnosis Date Noted   Drug overdose    Bipolar 1 disorder (HCC) 06/02/2016   Bipolar affective disorder, current episode depressed (HCC) 06/02/2016   OVARIAN CYST, RIGHT 06/30/2008   ECZEMA, ATOPIC DERMATITIS 08/20/2006    Past Surgical History:  Procedure Laterality Date   OVARIAN CYST REMOVAL       OB History   No obstetric history on file.     Family History  Problem Relation Age of Onset   Asthma Mother    Bipolar disorder Mother    Asthma Father    Hypertension Father    Bipolar disorder Father    Hypertension Paternal Grandmother    Diabetes Paternal Grandmother    Asthma Paternal Grandmother     Social History   Tobacco Use   Smoking status: Every Day    Packs/day: 0.50    Years: 0.00    Pack years: 0.00    Types: Cigarettes   Smokeless tobacco: Never  Vaping Use   Vaping Use:  Every day  Substance Use Topics   Alcohol use: No   Drug use: No    Home Medications Prior to Admission medications   Medication Sig Start Date End Date Taking? Authorizing Provider  albuterol (PROVENTIL HFA;VENTOLIN HFA) 108 (90 Base) MCG/ACT inhaler Inhale into the lungs every 6 (six) hours as needed for wheezing or shortness of breath.    [provider]  cetirizine (ZYRTEC ALLERGY) 10 MG tablet Take 1 tablet (10 mg total) by mouth daily. 05/08/21   Fondaw, Rodrigo Ran, PA  fluticasone (FLONASE) 50 MCG/ACT nasal spray Place 2 sprays into both nostrils daily for 14 days. 05/08/21 05/22/21  Gailen Shelter, PA  HYDROcodone-acetaminophen (NORCO/VICODIN) 5-325 MG tablet Take 1 tablet by mouth every 6 (six) hours as needed for severe pain (for breakthrough pain). 10/30/17   Zadie Rhine, MD  hydrocortisone cream 1 % Apply to affected area 2 times daily for 7-10 days 02/18/21   Ernie Avena, MD  ibuprofen (ADVIL,MOTRIN) 600 MG tablet Take 1 tablet (600 mg total) by mouth every 6 (six) hours as needed. 04/19/18   Jacalyn Lefevre, MD  mirtazapine (REMERON) 15 MG tablet Take 15 mg by mouth at bedtime.    [provider]  triamcinolone  ointment (KENALOG) 0.5 % Apply 1 application topically 2 (two) times daily. 05/08/21   Gailen Shelter, PA    Allergies    Dairy aid [tilactase], Peanut (diagnostic), and Penicillins  Review of Systems   Review of Systems  All other systems reviewed and are negative.  Physical Exam Updated Vital Signs BP 119/76 (BP Location: Left Arm)   Pulse 88   Temp 98.6 F (37 C) (Oral)   Resp 16   Ht 5\' 3"  (1.6 m)   Wt 87.5 kg   LMP 04/19/2021 (Approximate)   SpO2 99%   BMI 34.19 kg/m   Physical Exam Vitals and nursing note reviewed.  Constitutional:      Appearance: Normal appearance.  HENT:     Head: Normocephalic and atraumatic.  Eyes:     General:        Right eye: No discharge.        Left eye: No discharge.     Extraocular  Movements: Extraocular movements intact.     Conjunctiva/sclera: Conjunctivae normal.  Pulmonary:     Effort: Pulmonary effort is normal.  Skin:    General: Skin is warm and dry.     Findings: No rash.  Neurological:     General: No focal deficit present.     Mental Status: She is alert.     Comments: Cranial nerves II through XII are intact.  5-5 strength the upper and lower extremities.  Normal sensation to the upper and lower extremities.  Normal finger-nose.  Psychiatric:        Mood and Affect: Mood normal.        Behavior: Behavior normal.    ED Results / Procedures / Treatments   Labs (all labs ordered are listed, but only abnormal results are displayed) Labs Reviewed - No data to display  EKG None  Radiology CT Head Wo Contrast  Result Date: 05/13/2021 CLINICAL DATA:  Head trauma, mod-severe head injury EXAM: CT HEAD WITHOUT CONTRAST TECHNIQUE: Contiguous axial images were obtained from the base of the skull through the vertex without intravenous contrast. COMPARISON:  Ct head 05/06/17 FINDINGS: Brain: No evidence of large-territorial acute infarction. No parenchymal hemorrhage. No mass lesion. No extra-axial collection. No mass effect or midline shift. No hydrocephalus. Basilar cisterns are patent. Vascular: No hyperdense vessel. Skull: No acute fracture or focal lesion. Sinuses/Orbits: Paranasal sinuses and mastoid air cells are clear. The orbits are unremarkable. Other: None. IMPRESSION: No acute intracranial abnormality. Electronically Signed   By: 05/08/17 M.D.   On: 05/13/2021 19:47    Procedures Procedures   Medications Ordered in ED Medications - No data to display  ED Course  I have reviewed the triage vital signs and the nursing notes.  Pertinent labs & imaging results that were available during my care of the patient were reviewed by me and considered in my medical decision making (see chart for details).    MDM Rules/Calculators/A&P                           JERSEE WINIARSKI is a 25 y.o. female who presents the emergency department after head injury.  History and physical exam is reassuring for any emergent intracranial pathology at this time.  She has no significant red flags and is ultimately negative following [place] CT head rule.  Patient wishes to have a CT scan done for peace of mind.  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  CT head was negative.   I discussed at length with the patient and significant other about mild traumatic brain injury.  Patient and family expressed full understanding.  Strict turn precautions given.  She is safe for discharge.   Final Clinical Impression(s) / ED Diagnoses Final diagnoses:  Injury of head, initial encounter    Rx / DC Orders ED Discharge Orders     None        Jolyn Lent 05/13/21 2031    Glendora Score, MD 05/14/21 [phone]

## 2021-05-13 NOTE — Discharge Instructions (Addendum)
Imaging of your head was negative for any bleeding or significant abnormalities.  This is likely a concussion.  Please read the handout.  This will resolve with time.  Please return to the emergency room if you experience weakness/numbness to your upper or lower extremities, loss of consciousness, seizure-like activity, severe headache, or any other concerns you may have.

## 2021-07-29 ENCOUNTER — Emergency Department (HOSPITAL_COMMUNITY)
Admission: EM | Admit: 2021-07-29 | Discharge: 2021-07-30 | Disposition: A | Discharge status: Home / Self Care | Payer: Self-pay | Attending: Emergency Medicine | Admitting: Emergency Medicine

## 2021-07-29 ENCOUNTER — Other Ambulatory Visit: Payer: Self-pay

## 2021-07-29 ENCOUNTER — Encounter (HOSPITAL_COMMUNITY): Payer: Self-pay

## 2021-07-29 DIAGNOSIS — N9489 Other specified conditions associated with female genital organs and menstrual cycle: Secondary | ICD-10-CM | POA: Insufficient documentation

## 2021-07-29 DIAGNOSIS — M545 Low back pain, unspecified: Secondary | ICD-10-CM | POA: Insufficient documentation

## 2021-07-29 DIAGNOSIS — Z91010 Allergy to peanuts: Secondary | ICD-10-CM | POA: Insufficient documentation

## 2021-07-29 DIAGNOSIS — R1084 Generalized abdominal pain: Secondary | ICD-10-CM | POA: Insufficient documentation

## 2021-07-29 DIAGNOSIS — R112 Nausea with vomiting, unspecified: Secondary | ICD-10-CM | POA: Insufficient documentation

## 2021-07-29 LAB — CBC WITH DIFFERENTIAL/PLATELET
Abs Immature Granulocytes: 0 10*3/uL (ref 0.00–0.07)
Basophils Absolute: 0.1 10*3/uL (ref 0.0–0.1)
Basophils Relative: 2 %
Eosinophils Absolute: 0.5 10*3/uL (ref 0.0–0.5)
Eosinophils Relative: 8 %
HCT: 38.8 % (ref 36.0–46.0)
Hemoglobin: 13 g/dL (ref 12.0–15.0)
Immature Granulocytes: 0 %
Lymphocytes Relative: 49 %
Lymphs Abs: 3.1 10*3/uL (ref 0.7–4.0)
MCH: 29.9 pg (ref 26.0–34.0)
MCHC: 33.5 g/dL (ref 30.0–36.0)
MCV: 89.2 fL (ref 80.0–100.0)
Monocytes Absolute: 0.3 10*3/uL (ref 0.1–1.0)
Monocytes Relative: 5 %
Neutro Abs: 2.3 10*3/uL (ref 1.7–7.7)
Neutrophils Relative %: 36 %
Platelets: 230 10*3/uL (ref 150–400)
RBC: 4.35 MIL/uL (ref 3.87–5.11)
RDW: 12.1 % (ref 11.5–15.5)
WBC: 6.2 10*3/uL (ref 4.0–10.5)
nRBC: 0 % (ref 0.0–0.2)

## 2021-07-29 LAB — COMPREHENSIVE METABOLIC PANEL
ALT: 16 U/L (ref 0–44)
AST: 22 U/L (ref 15–41)
Albumin: 4 g/dL (ref 3.5–5.0)
Alkaline Phosphatase: 46 U/L (ref 38–126)
Anion gap: 9 (ref 5–15)
BUN: 12 mg/dL (ref 6–20)
CO2: 23 mmol/L (ref 22–32)
Calcium: 8.9 mg/dL (ref 8.9–10.3)
Chloride: 106 mmol/L (ref 98–111)
Creatinine, Ser: 0.76 mg/dL (ref 0.44–1.00)
GFR, Estimated: >60 mL/min (ref >60)
Glucose, Bld: 97 mg/dL (ref 70–99)
Potassium: 3.9 mmol/L (ref 3.5–5.1)
Sodium: 138 mmol/L (ref 135–145)
Total Bilirubin: 0.4 mg/dL (ref 0.3–1.2)
Total Protein: 7.4 g/dL (ref 6.5–8.1)

## 2021-07-29 LAB — HCG, QUANTITATIVE, PREGNANCY: hCG, Beta Chain, Quant, S: <1 m[IU]/mL (ref <5)

## 2021-07-29 LAB — URINALYSIS, ROUTINE W REFLEX MICROSCOPIC
Bilirubin Urine: NEGATIVE
Glucose, UA: NEGATIVE mg/dL
Hgb urine dipstick: NEGATIVE
Ketones, ur: NEGATIVE mg/dL
Leukocytes,Ua: NEGATIVE
Nitrite: NEGATIVE
Protein, ur: NEGATIVE mg/dL
Specific Gravity, Urine: 1.025 (ref 1.005–1.030)
pH: 6 (ref 5.0–8.0)

## 2021-07-29 LAB — LIPASE, BLOOD: Lipase: 35 U/L (ref 11–51)

## 2021-07-29 MED ORDER — LIDOCAINE VISCOUS HCL 2 % MT SOLN
15.0000 mL | Freq: Once | OROMUCOSAL | Status: AC
  Administered 2021-07-29: 15 mL via ORAL
  Filled 2021-07-29: qty 15

## 2021-07-29 MED ORDER — OMEPRAZOLE 20 MG PO CPDR: 20.0000 mg | DELAYED_RELEASE_CAPSULE | Freq: Two times a day (BID) | ORAL | 0 refills | Status: DC

## 2021-07-29 MED ORDER — ALUM & MAG HYDROXIDE-SIMETH 200-200-20 MG/5ML PO SUSP
30.0000 mL | Freq: Once | ORAL | Status: AC
  Administered 2021-07-29: 30 mL via ORAL
  Filled 2021-07-29: qty 30

## 2021-07-29 NOTE — ED Triage Notes (Signed)
Pt complains of abdominal pain that has been going on for months. Pt also reports lower back pain.
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
## 2021-07-29 NOTE — Discharge Instructions (Addendum)
Follow-up with a primary care provider, if you do not have one, please contact referral given today. Take omeprazole daily as prescribed.  Return to the emergency room for worsening or concerning symptoms.

## 2021-07-30 ENCOUNTER — Emergency Department (HOSPITAL_COMMUNITY): Payer: Self-pay
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
## 2021-08-06 ENCOUNTER — Encounter (HOSPITAL_COMMUNITY): Payer: Self-pay

## 2021-08-06 ENCOUNTER — Other Ambulatory Visit: Payer: Self-pay

## 2021-08-06 ENCOUNTER — Emergency Department (HOSPITAL_COMMUNITY)
Admission: EM | Admit: 2021-08-06 | Discharge: 2021-08-06 | Disposition: A | Discharge status: Home / Self Care | Payer: Medicaid Other | Attending: Emergency Medicine | Admitting: Emergency Medicine

## 2021-08-06 DIAGNOSIS — Z760 Encounter for issue of repeat prescription: Secondary | ICD-10-CM | POA: Insufficient documentation

## 2021-08-06 DIAGNOSIS — Y990 Civilian activity done for income or pay: Secondary | ICD-10-CM | POA: Insufficient documentation

## 2021-08-06 DIAGNOSIS — Z7951 Long term (current) use of inhaled steroids: Secondary | ICD-10-CM | POA: Insufficient documentation

## 2021-08-06 DIAGNOSIS — J45909 Unspecified asthma, uncomplicated: Secondary | ICD-10-CM | POA: Insufficient documentation

## 2021-08-06 DIAGNOSIS — Z91010 Allergy to peanuts: Secondary | ICD-10-CM | POA: Insufficient documentation

## 2021-08-06 MED ORDER — ALBUTEROL SULFATE HFA 108 (90 BASE) MCG/ACT IN AERS
2.0000 | INHALATION_SPRAY | Freq: Once | RESPIRATORY_TRACT | Status: AC
  Administered 2021-08-06: 2 via RESPIRATORY_TRACT
  Filled 2021-08-06: qty 6.7

## 2021-08-06 NOTE — ED Provider Notes (Signed)
[facility]-EMERGENCY DEPT Provider Note   CSN: [id number] Arrival date & time: 08/06/21  2110     History  Chief Complaint  Patient presents with   Medication Refill    Deanna [name] is a 26 y.o. female.  HPI  26 year old female with a history of allergies, anxiety, asthma, depression, eczema, ovarian cyst, who presents to the emergency department today for medication refill and a work note.  She states that she has a history of asthma and it is typically seasonal in nature.  It usually flares up when the seasons change and at this time of year.  She does a lot of heavy lifting at work and where she works is very warm so this triggered her symptoms today.  She told her boss who advised her to come to the ED and get a work note.  She states she just needs a refill of her inhaler.  She denies any severe symptoms right now but she has had a cough and some wheezing.  Home Medications Prior to Admission medications   Medication Sig Start Date End Date Taking? Authorizing Provider  albuterol (PROVENTIL HFA;VENTOLIN HFA) 108 (90 Base) MCG/ACT inhaler Inhale into the lungs every 6 (six) hours as needed for wheezing or shortness of breath.    [provider]  cetirizine (ZYRTEC ALLERGY) 10 MG tablet Take 1 tablet (10 mg total) by mouth daily. 05/08/21   Fondaw, Rodrigo Ran, PA  fluticasone (FLONASE) 50 MCG/ACT nasal spray Place 2 sprays into both nostrils daily for 14 days. 05/08/21 05/22/21  Gailen Shelter, PA  HYDROcodone-acetaminophen (NORCO/VICODIN) 5-325 MG tablet Take 1 tablet by mouth every 6 (six) hours as needed for severe pain (for breakthrough pain). 10/30/17   Zadie Rhine, MD  hydrocortisone cream 1 % Apply to affected area 2 times daily for 7-10 days 02/18/21   Ernie Avena, MD  ibuprofen (ADVIL,MOTRIN) 600 MG tablet Take 1 tablet (600 mg total) by mouth every 6 (six) hours as needed. 04/19/18   Jacalyn Lefevre, MD  mirtazapine (REMERON) 15 MG  tablet Take 15 mg by mouth at bedtime.    [provider]  omeprazole (PRILOSEC) 20 MG capsule Take 1 capsule (20 mg total) by mouth 2 (two) times daily before a meal. 07/29/21 08/28/21  Army Melia A, PA-C  triamcinolone ointment (KENALOG) 0.5 % Apply 1 application topically 2 (two) times daily. 05/08/21   Gailen Shelter, PA      Allergies    Dairy aid [tilactase], Peanut (diagnostic), and Penicillins    Review of Systems   Review of Systems See HPI for pertinent positives or negatives.   Physical Exam Updated Vital Signs BP 131/89    Pulse 82    Temp 98.9 F (37.2 C) (Oral)    Resp 17    SpO2 97%  Physical Exam Vitals and nursing note reviewed.  Constitutional:      General: She is not in acute distress.    Appearance: She is well-developed.  HENT:     Head: Normocephalic and atraumatic.  Eyes:     Conjunctiva/sclera: Conjunctivae normal.  Cardiovascular:     Rate and Rhythm: Normal rate and regular rhythm.     Heart sounds: Normal heart sounds.  Pulmonary:     Effort: Pulmonary effort is normal. No respiratory distress.     Breath sounds: Normal breath sounds. No wheezing, rhonchi or rales.     Comments: No tachypnea Abdominal:     Palpations: Abdomen is soft.  Tenderness: There is no abdominal tenderness.  Musculoskeletal:        General: Normal range of motion.     Cervical back: Neck supple.  Skin:    General: Skin is warm and dry.  Neurological:     Mental Status: She is alert.    ED Results / Procedures / Treatments   Labs (all labs ordered are listed, but only abnormal results are displayed) Labs Reviewed - No data to display  EKG None  Radiology No results found.  Procedures Procedures    Medications Ordered in ED Medications  albuterol (VENTOLIN HFA) 108 (90 Base) MCG/ACT inhaler 2 puff (has no administration in time range)    ED Course/ Medical Decision Making/ A&P                           Medical Decision  Making  26 year old female here for medication refill.  History of asthma, history complicated by this.  Social determinants of health impact the patient's care that she does not have a PCP to get a refill for her prescription.  Her vital signs are normal here in the ED.  Her lung exam is actually very reassuring.  We will refill her inhaler.  I do not think that she needs an infectious work-up at this time and have lower suspicion for any emergent cardiopulmonary cause of symptoms at this time.  Feel she is appropriate for outpatient follow-up and discharge home.  She is given information to follow-up with a PCP.  She is advised to return if worse.  She voices understanding of the plan and reasons to return.  All Questions answered.  Patient stable for discharge.   Final Clinical Impression(s) / ED Diagnoses Final diagnoses:  Medication refill    Rx / DC Orders ED Discharge Orders     None         Rayne Du 08/06/21 2239    Charlynne Pander, MD 08/06/21 [phone]

## 2021-08-06 NOTE — Discharge Instructions (Addendum)
Take albuterol as directed  Please follow up with your primary care provider within 5-7 days for re-evaluation of your symptoms. If you do not have a primary care provider, information for a healthcare clinic has been provided for you to make arrangements for follow up care. Please return to the emergency department for any new or worsening symptoms.

## 2021-08-06 NOTE — ED Triage Notes (Signed)
Pt states that she was having some SHOB at her job because she has asthma and it is hot where she works. Pt states that she does not have an inhaler to take to work and states that she feels fine her job was just concerned.

## 2022-02-07 ENCOUNTER — Other Ambulatory Visit: Payer: Self-pay

## 2022-02-07 ENCOUNTER — Emergency Department (HOSPITAL_BASED_OUTPATIENT_CLINIC_OR_DEPARTMENT_OTHER)
Admission: EM | Admit: 2022-02-07 | Discharge: 2022-02-07 | Discharge status: Against Medical Advice | Payer: Medicaid Other

## 2022-02-07 NOTE — ED Notes (Signed)
Called Once for Triage with No Response. 

## 2022-02-07 NOTE — ED Notes (Signed)
Called for triage with no response  

## 2022-06-20 ENCOUNTER — Encounter: Payer: Managed Care, Other (non HMO) | Admitting: Family Medicine

## 2023-01-22 DIAGNOSIS — Z419 Encounter for procedure for purposes other than remedying health state, unspecified: Secondary | ICD-10-CM | POA: Diagnosis not present

## 2023-02-06 DIAGNOSIS — G4700 Insomnia, unspecified: Secondary | ICD-10-CM | POA: Diagnosis not present

## 2023-02-06 DIAGNOSIS — F419 Anxiety disorder, unspecified: Secondary | ICD-10-CM | POA: Diagnosis not present

## 2023-02-06 DIAGNOSIS — F319 Bipolar disorder, unspecified: Secondary | ICD-10-CM | POA: Diagnosis not present

## 2023-02-22 DIAGNOSIS — Z419 Encounter for procedure for purposes other than remedying health state, unspecified: Secondary | ICD-10-CM | POA: Diagnosis not present

## 2023-02-23 ENCOUNTER — Other Ambulatory Visit: Payer: Self-pay

## 2023-02-23 ENCOUNTER — Encounter (HOSPITAL_BASED_OUTPATIENT_CLINIC_OR_DEPARTMENT_OTHER): Payer: Self-pay | Admitting: Emergency Medicine

## 2023-02-23 ENCOUNTER — Emergency Department (HOSPITAL_BASED_OUTPATIENT_CLINIC_OR_DEPARTMENT_OTHER)
Admission: EM | Admit: 2023-02-23 | Discharge: 2023-02-23 | Disposition: A | Discharge status: Home / Self Care | Payer: Medicaid Other | Attending: Emergency Medicine | Admitting: Emergency Medicine

## 2023-02-23 DIAGNOSIS — Z91010 Allergy to peanuts: Secondary | ICD-10-CM | POA: Diagnosis not present

## 2023-02-23 DIAGNOSIS — N760 Acute vaginitis: Secondary | ICD-10-CM | POA: Diagnosis not present

## 2023-02-23 DIAGNOSIS — B3731 Acute candidiasis of vulva and vagina: Secondary | ICD-10-CM | POA: Diagnosis not present

## 2023-02-23 DIAGNOSIS — N898 Other specified noninflammatory disorders of vagina: Secondary | ICD-10-CM | POA: Diagnosis present

## 2023-02-23 DIAGNOSIS — B9689 Other specified bacterial agents as the cause of diseases classified elsewhere: Secondary | ICD-10-CM | POA: Diagnosis not present

## 2023-02-23 LAB — URINALYSIS, W/ REFLEX TO CULTURE (INFECTION SUSPECTED)
Bacteria, UA: NONE SEEN
Bilirubin Urine: NEGATIVE
Glucose, UA: NEGATIVE mg/dL
Hgb urine dipstick: NEGATIVE
Ketones, ur: NEGATIVE mg/dL
Leukocytes,Ua: NEGATIVE
Nitrite: NEGATIVE
Protein, ur: NEGATIVE mg/dL
Specific Gravity, Urine: >=1.03 (ref 1.005–1.030)
pH: 5.5 (ref 5.0–8.0)

## 2023-02-23 LAB — WET PREP, GENITAL
Sperm: NONE SEEN
Trich, Wet Prep: NONE SEEN
WBC, Wet Prep HPF POC: >=10 — AB (ref <10)

## 2023-02-23 LAB — HIV ANTIBODY (ROUTINE TESTING W REFLEX): HIV Screen 4th Generation wRfx: NONREACTIVE

## 2023-02-23 LAB — PREGNANCY, URINE: Preg Test, Ur: NEGATIVE

## 2023-02-23 MED ORDER — DOXYCYCLINE HYCLATE 100 MG PO CAPS: 100.0000 mg | ORAL_CAPSULE | Freq: Two times a day (BID) | ORAL | 0 refills | Status: AC

## 2023-02-23 MED ORDER — FLUCONAZOLE 150 MG PO TABS: 150.0000 mg | ORAL_TABLET | Freq: Once | ORAL | 0 refills | Status: AC

## 2023-02-23 MED ORDER — DOXYCYCLINE HYCLATE 100 MG PO TABS
100.0000 mg | ORAL_TABLET | Freq: Once | ORAL | Status: AC
  Administered 2023-02-23: 100 mg via ORAL
  Filled 2023-02-23: qty 1

## 2023-02-23 MED ORDER — FLUCONAZOLE 150 MG PO TABS
150.0000 mg | ORAL_TABLET | Freq: Once | ORAL | Status: AC
  Administered 2023-02-23: 150 mg via ORAL
  Filled 2023-02-23: qty 1

## 2023-02-23 MED ORDER — LIDOCAINE HCL (PF) 1 % IJ SOLN
INTRAMUSCULAR | Status: AC
  Filled 2023-02-23: qty 5

## 2023-02-23 MED ORDER — METRONIDAZOLE 500 MG PO TABS: 500.0000 mg | ORAL_TABLET | Freq: Two times a day (BID) | ORAL | 0 refills | Status: DC

## 2023-02-23 MED ORDER — CEFTRIAXONE SODIUM 500 MG IJ SOLR
500.0000 mg | Freq: Once | INTRAMUSCULAR | Status: AC
  Administered 2023-02-23: 500 mg via INTRAMUSCULAR
  Filled 2023-02-23: qty 500

## 2023-02-23 NOTE — Discharge Instructions (Addendum)
Your wet prep was positive for both yeast and bacterial vaginosis.  We will give you a single dose of Diflucan here for the yeast infection.  If you have continued symptoms of vaginitis, take an additional dose of Diflucan in 3 days time.  You have been empirically treated for STIs, your gonorrhea and chlamydia results and your HIV and syphilis test results will be available on the patient portal.  Take Flagyl for bacterial vaginosis, do not drink alcohol while taking Flagyl.

## 2023-02-23 NOTE — ED Provider Notes (Signed)
[place] [facility] Provider Note   CSN: [id number] Arrival date & time: 02/23/23  0940     History  Chief Complaint  Patient presents with   Vaginal Discharge    Deanna [name] is a 27 y.o. female.   Vaginal Discharge    27 year old female presenting to the Emergency Department with vaginal discharge and odor.  Symptoms been ongoing for the past 3 weeks.  The patient consents to sexually transmitted infection.  Her last menstrual period was 1 week ago.  She denies any current vaginal bleeding.  She denies any suprapubic discomfort, dysuria or increased urinary frequency.  She denies any abdominal pain, nausea or vomiting.  Home Medications Prior to Admission medications   Medication Sig Start Date End Date Taking? Authorizing Provider  doxycycline (VIBRAMYCIN) 100 MG capsule Take 1 capsule (100 mg total) by mouth 2 (two) times daily for 7 days. 02/23/23 03/02/23 Yes Ernie Avena, MD  fluconazole (DIFLUCAN) 150 MG tablet Take 1 tablet (150 mg total) by mouth once for 1 dose. 02/25/23 02/25/23 Yes Ernie Avena, MD  metroNIDAZOLE (FLAGYL) 500 MG tablet Take 1 tablet (500 mg total) by mouth 2 (two) times daily. 02/23/23  Yes Ernie Avena, MD  albuterol (PROVENTIL HFA;VENTOLIN HFA) 108 (90 Base) MCG/ACT inhaler Inhale into the lungs every 6 (six) hours as needed for wheezing or shortness of breath.    [provider]  cetirizine (ZYRTEC ALLERGY) 10 MG tablet Take 1 tablet (10 mg total) by mouth daily. 05/08/21   Fondaw, Rodrigo Ran, PA  fluticasone (FLONASE) 50 MCG/ACT nasal spray Place 2 sprays into both nostrils daily for 14 days. 05/08/21 05/22/21  Gailen Shelter, PA  HYDROcodone-acetaminophen (NORCO/VICODIN) 5-325 MG tablet Take 1 tablet by mouth every 6 (six) hours as needed for severe pain (for breakthrough pain). 10/30/17   Zadie Rhine, MD  hydrocortisone cream 1 % Apply to affected area 2 times daily for 7-10 days 02/18/21   Ernie Avena, MD  ibuprofen (ADVIL,MOTRIN) 600 MG tablet Take 1 tablet (600 mg total) by mouth every 6 (six) hours as needed. 04/19/18   Jacalyn Lefevre, MD  mirtazapine (REMERON) 15 MG tablet Take 15 mg by mouth at bedtime.    [provider]  omeprazole (PRILOSEC) 20 MG capsule Take 1 capsule (20 mg total) by mouth 2 (two) times daily before a meal. 07/29/21 08/28/21  Army Melia A, PA-C  triamcinolone ointment (KENALOG) 0.5 % Apply 1 application topically 2 (two) times daily. 05/08/21   Gailen Shelter, PA      Allergies    Dairy aid [tilactase], Peanut (diagnostic), and Penicillins    Review of Systems   Review of Systems  Genitourinary:  Positive for vaginal discharge.  All other systems reviewed and are negative.   Physical Exam Updated Vital Signs BP 109/75 (BP Location: Left Arm)   Pulse 78   Temp 98.2 F (36.8 C) (Oral)   Resp 16   Ht 5\' 3"  (1.6 m)   Wt 83.5 kg   LMP 02/16/2023   SpO2 98%   BMI 32.59 kg/m  Physical Exam Vitals and nursing note reviewed. Exam conducted with a chaperone present.  Constitutional:      General: She is not in acute distress.    Appearance: She is well-developed.  HENT:     Head: Normocephalic and atraumatic.  Eyes:     Conjunctiva/sclera: Conjunctivae normal.  Cardiovascular:     Rate and Rhythm: Normal rate and regular  rhythm.  Pulmonary:     Effort: Pulmonary effort is normal. No respiratory distress.     Breath sounds: Normal breath sounds.  Abdominal:     Palpations: Abdomen is soft.     Tenderness: There is no abdominal tenderness.  Genitourinary:    Comments: Cervical discharge present, no active bleeding, negative bilateral adnexal tenderness, negative cervical motion tenderness Musculoskeletal:        General: No swelling.     Cervical back: Neck supple.  Skin:    General: Skin is warm and dry.     Capillary Refill: Capillary refill takes less than 2 seconds.  Neurological:     Mental Status: She is alert.   Psychiatric:        Mood and Affect: Mood normal.     ED Results / Procedures / Treatments   Labs (all labs ordered are listed, but only abnormal results are displayed) Labs Reviewed  WET PREP, GENITAL - Abnormal; Notable for the following components:      Result Value   Yeast Wet Prep HPF POC PRESENT (*)    Clue Cells Wet Prep HPF POC PRESENT (*)    WBC, Wet Prep HPF POC >=10 (*)    All other components within normal limits  URINALYSIS, W/ REFLEX TO CULTURE (INFECTION SUSPECTED)  PREGNANCY, URINE  HIV ANTIBODY (ROUTINE TESTING W REFLEX)  RPR  GC/CHLAMYDIA PROBE AMP ([place]) NOT AT [facility]    EKG None  Radiology No results found.  Procedures Procedures    Medications Ordered in ED Medications  fluconazole (DIFLUCAN) tablet 150 mg (has no administration in time range)  cefTRIAXone (ROCEPHIN) injection 500 mg (500 mg Intramuscular Given 02/23/23 1250)  doxycycline (VIBRA-TABS) tablet 100 mg (100 mg Oral Given 02/23/23 1249)  lidocaine (PF) (XYLOCAINE) 1 % injection (  Given 02/23/23 1254)    ED Course/ Medical Decision Making/ A&P Clinical Course as of 02/23/23 1310  Mon Feb 23, 2023  1306 Yeast Wet Prep HPF POC(!): PRESENT [JL]  1306 Clue Cells Wet Prep HPF POC(!): PRESENT [JL]    Clinical Course User Index [JL] Ernie Avena, MD                                 Medical Decision Making Amount and/or Complexity of Data Reviewed Labs: ordered. Decision-making details documented in ED Course.  Risk Prescription drug management.    27 year old female presenting to the Emergency Department with vaginal discharge and odor.  Symptoms been ongoing for the past 3 weeks.  The patient consents to sexually transmitted infection.  Her last menstrual period was 1 week ago.  She denies any current vaginal bleeding.  She denies any suprapubic discomfort, dysuria or increased urinary frequency.  She denies any abdominal pain, nausea or vomiting.  On arrival, the patient was  vitally stable, afebrile, hemodynamically stable.  Physical exam significant for cervical discharge present, no active bleeding, no cervical motion tenderness or adnexal tenderness on speculum exam.  The patient consented to STI testing.  HIV and RPR testing was collected via blood work.  The patient's cervix was swabbed for GC/chlamydia.  She consented to empiric STI coverage.  A wet prep was sent and resulted positive for yeast and bacterial vaginosis.  UA: Negative for UTI U Preg: Negative  Patient without abdominal tenderness, no pelvic tenderness, low concern for PID.  Will treat empirically with Rocephin and doxycycline to cover for STIs.  Will treat  with a dose of Diflucan for yeast vaginitis.  Will treat for bacterial vaginosis with a course of Flagyl.  Patient provided return precautions, overall stable for discharge.   Final Clinical Impression(s) / ED Diagnoses Final diagnoses:  Yeast vaginitis  Bacterial vaginosis    Rx / DC Orders ED Discharge Orders          Ordered    fluconazole (DIFLUCAN) 150 MG tablet   Once        02/23/23 1310    doxycycline (VIBRAMYCIN) 100 MG capsule  2 times daily        02/23/23 1308    metroNIDAZOLE (FLAGYL) 500 MG tablet  2 times daily        02/23/23 1310              Ernie Avena, MD 02/23/23 1310

## 2023-02-23 NOTE — ED Notes (Signed)
D/c paperwork reviewed with pt, including prescriptions and follow up care.  All questions and/or concerns addressed at time of d/c.  No further needs expressed. . Pt verbalized understanding, Ambulatory without assistance to ED exit, NAD.   

## 2023-02-23 NOTE — ED Triage Notes (Signed)
States has had a vag d/c with a bad odor x 3 weeks

## 2023-02-24 LAB — RPR: RPR Ser Ql: NONREACTIVE

## 2023-02-26 LAB — GC/CHLAMYDIA PROBE AMP (~~LOC~~) NOT AT ARMC
Chlamydia: NEGATIVE
Comment: NEGATIVE
Comment: NORMAL
Neisseria Gonorrhea: NEGATIVE

## 2023-03-16 ENCOUNTER — Other Ambulatory Visit: Payer: Self-pay

## 2023-03-16 ENCOUNTER — Emergency Department (HOSPITAL_COMMUNITY)
Admission: EM | Admit: 2023-03-16 | Discharge: 2023-03-16 | Discharge status: Against Medical Advice | Payer: Medicaid Other | Attending: Emergency Medicine | Admitting: Emergency Medicine

## 2023-03-16 ENCOUNTER — Encounter (HOSPITAL_COMMUNITY): Payer: Self-pay

## 2023-03-16 DIAGNOSIS — R21 Rash and other nonspecific skin eruption: Secondary | ICD-10-CM | POA: Insufficient documentation

## 2023-03-16 DIAGNOSIS — Z5321 Procedure and treatment not carried out due to patient leaving prior to being seen by health care provider: Secondary | ICD-10-CM | POA: Insufficient documentation

## 2023-03-16 DIAGNOSIS — R4182 Altered mental status, unspecified: Secondary | ICD-10-CM | POA: Diagnosis not present

## 2023-03-16 DIAGNOSIS — M545 Low back pain, unspecified: Secondary | ICD-10-CM | POA: Diagnosis not present

## 2023-03-16 MED ORDER — DIPHENHYDRAMINE HCL 25 MG PO CAPS
25.0000 mg | ORAL_CAPSULE | Freq: Once | ORAL | Status: AC
  Administered 2023-03-16: 25 mg via ORAL
  Filled 2023-03-16: qty 1

## 2023-03-16 NOTE — ED Notes (Signed)
Pt called in lobby and personal phone called. No reponse
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
## 2023-03-19 ENCOUNTER — Encounter (HOSPITAL_COMMUNITY): Payer: Self-pay | Admitting: Family Medicine

## 2023-03-19 ENCOUNTER — Other Ambulatory Visit: Payer: Self-pay

## 2023-03-19 ENCOUNTER — Emergency Department (HOSPITAL_COMMUNITY): Payer: Medicaid Other

## 2023-03-19 ENCOUNTER — Inpatient Hospital Stay (HOSPITAL_COMMUNITY): Payer: Medicaid Other

## 2023-03-19 ENCOUNTER — Inpatient Hospital Stay (HOSPITAL_COMMUNITY)
Admission: EM | Admit: 2023-03-19 | Discharge: 2023-03-22 | DRG: 918 | Disposition: A | Discharge status: Other Acute Inpatient Hospital | Payer: Medicaid Other | Attending: Family Medicine | Admitting: Family Medicine

## 2023-03-19 DIAGNOSIS — Z604 Social exclusion and rejection: Secondary | ICD-10-CM | POA: Diagnosis present

## 2023-03-19 DIAGNOSIS — Z91010 Allergy to peanuts: Secondary | ICD-10-CM

## 2023-03-19 DIAGNOSIS — F17210 Nicotine dependence, cigarettes, uncomplicated: Secondary | ICD-10-CM | POA: Diagnosis not present

## 2023-03-19 DIAGNOSIS — R4182 Altered mental status, unspecified: Secondary | ICD-10-CM | POA: Diagnosis present

## 2023-03-19 DIAGNOSIS — Z91011 Allergy to milk products: Secondary | ICD-10-CM | POA: Diagnosis not present

## 2023-03-19 DIAGNOSIS — G4710 Hypersomnia, unspecified: Secondary | ICD-10-CM | POA: Diagnosis not present

## 2023-03-19 DIAGNOSIS — Z79899 Other long term (current) drug therapy: Secondary | ICD-10-CM | POA: Diagnosis not present

## 2023-03-19 DIAGNOSIS — T40412A Poisoning by fentanyl or fentanyl analogs, intentional self-harm, initial encounter: Principal | ICD-10-CM | POA: Diagnosis present

## 2023-03-19 DIAGNOSIS — Z880 Allergy status to penicillin: Secondary | ICD-10-CM | POA: Diagnosis not present

## 2023-03-19 DIAGNOSIS — R000 Tachycardia, unspecified: Secondary | ICD-10-CM | POA: Diagnosis not present

## 2023-03-19 DIAGNOSIS — Z818 Family history of other mental and behavioral disorders: Secondary | ICD-10-CM

## 2023-03-19 DIAGNOSIS — F411 Generalized anxiety disorder: Secondary | ICD-10-CM | POA: Diagnosis present

## 2023-03-19 DIAGNOSIS — R569 Unspecified convulsions: Secondary | ICD-10-CM | POA: Diagnosis not present

## 2023-03-19 DIAGNOSIS — Z91148 Patient's other noncompliance with medication regimen for other reason: Secondary | ICD-10-CM

## 2023-03-19 DIAGNOSIS — F314 Bipolar disorder, current episode depressed, severe, without psychotic features: Secondary | ICD-10-CM | POA: Diagnosis not present

## 2023-03-19 DIAGNOSIS — Z7289 Other problems related to lifestyle: Secondary | ICD-10-CM | POA: Diagnosis not present

## 2023-03-19 DIAGNOSIS — F315 Bipolar disorder, current episode depressed, severe, with psychotic features: Secondary | ICD-10-CM | POA: Diagnosis present

## 2023-03-19 DIAGNOSIS — T40604A Poisoning by unspecified narcotics, undetermined, initial encounter: Principal | ICD-10-CM

## 2023-03-19 DIAGNOSIS — N179 Acute kidney failure, unspecified: Secondary | ICD-10-CM | POA: Diagnosis not present

## 2023-03-19 DIAGNOSIS — R0681 Apnea, not elsewhere classified: Secondary | ICD-10-CM | POA: Diagnosis not present

## 2023-03-19 DIAGNOSIS — R509 Fever, unspecified: Secondary | ICD-10-CM | POA: Diagnosis not present

## 2023-03-19 DIAGNOSIS — T1491XA Suicide attempt, initial encounter: Secondary | ICD-10-CM | POA: Diagnosis present

## 2023-03-19 DIAGNOSIS — R231 Pallor: Secondary | ICD-10-CM | POA: Diagnosis not present

## 2023-03-19 DIAGNOSIS — E559 Vitamin D deficiency, unspecified: Secondary | ICD-10-CM | POA: Diagnosis not present

## 2023-03-19 DIAGNOSIS — T402X1A Poisoning by other opioids, accidental (unintentional), initial encounter: Secondary | ICD-10-CM | POA: Diagnosis not present

## 2023-03-19 DIAGNOSIS — Z9152 Personal history of nonsuicidal self-harm: Secondary | ICD-10-CM | POA: Diagnosis not present

## 2023-03-19 DIAGNOSIS — Z743 Need for continuous supervision: Secondary | ICD-10-CM | POA: Diagnosis not present

## 2023-03-19 LAB — CBC WITH DIFFERENTIAL/PLATELET
Abs Immature Granulocytes: 0.05 10*3/uL (ref 0.00–0.07)
Basophils Absolute: 0 10*3/uL (ref 0.0–0.1)
Basophils Relative: 0 %
Eosinophils Absolute: 0 10*3/uL (ref 0.0–0.5)
Eosinophils Relative: 0 %
HCT: 41.3 % (ref 36.0–46.0)
Hemoglobin: 13.6 g/dL (ref 12.0–15.0)
Immature Granulocytes: 0 %
Lymphocytes Relative: 6 %
Lymphs Abs: 0.8 10*3/uL (ref 0.7–4.0)
MCH: 30.4 pg (ref 26.0–34.0)
MCHC: 32.9 g/dL (ref 30.0–36.0)
MCV: 92.2 fL (ref 80.0–100.0)
Monocytes Absolute: 0.4 10*3/uL (ref 0.1–1.0)
Monocytes Relative: 3 %
Neutro Abs: 11.8 10*3/uL — ABNORMAL HIGH (ref 1.7–7.7)
Neutrophils Relative %: 91 %
Platelets: 293 10*3/uL (ref 150–400)
RBC: 4.48 MIL/uL (ref 3.87–5.11)
RDW: 11.9 % (ref 11.5–15.5)
WBC: 13.1 10*3/uL — ABNORMAL HIGH (ref 4.0–10.5)
nRBC: 0 % (ref 0.0–0.2)

## 2023-03-19 LAB — COMPREHENSIVE METABOLIC PANEL
ALT: 23 U/L (ref 0–44)
AST: 23 U/L (ref 15–41)
Albumin: 3.8 g/dL (ref 3.5–5.0)
Alkaline Phosphatase: 44 U/L (ref 38–126)
Anion gap: 12 (ref 5–15)
BUN: 14 mg/dL (ref 6–20)
CO2: 23 mmol/L (ref 22–32)
Calcium: 8.2 mg/dL — ABNORMAL LOW (ref 8.9–10.3)
Chloride: 105 mmol/L (ref 98–111)
Creatinine, Ser: 2.09 mg/dL — ABNORMAL HIGH (ref 0.44–1.00)
GFR, Estimated: 32 mL/min — ABNORMAL LOW (ref >60)
Glucose, Bld: 135 mg/dL — ABNORMAL HIGH (ref 70–99)
Potassium: 4.1 mmol/L (ref 3.5–5.1)
Sodium: 140 mmol/L (ref 135–145)
Total Bilirubin: 0.6 mg/dL (ref 0.3–1.2)
Total Protein: 7.2 g/dL (ref 6.5–8.1)

## 2023-03-19 LAB — TROPONIN I (HIGH SENSITIVITY)
Troponin I (High Sensitivity): 157 ng/L (ref <18)
Troponin I (High Sensitivity): 147 ng/L (ref <18)

## 2023-03-19 LAB — I-STAT VENOUS BLOOD GAS, ED
Acid-base deficit: 2 mmol/L (ref 0.0–2.0)
Bicarbonate: 23.7 mmol/L (ref 20.0–28.0)
Calcium, Ion: 1.07 mmol/L — ABNORMAL LOW (ref 1.15–1.40)
HCT: 37 % (ref 36.0–46.0)
Hemoglobin: 12.6 g/dL (ref 12.0–15.0)
O2 Saturation: 76 %
Potassium: 4.4 mmol/L (ref 3.5–5.1)
Sodium: 140 mmol/L (ref 135–145)
TCO2: 25 mmol/L (ref 22–32)
pCO2, Ven: 44.5 mmHg (ref 44–60)
pH, Ven: 7.335 (ref 7.25–7.43)
pO2, Ven: 43 mmHg (ref 32–45)

## 2023-03-19 LAB — BLOOD GAS, VENOUS
Acid-base deficit: 2.6 mmol/L — ABNORMAL HIGH (ref 0.0–2.0)
Bicarbonate: 23.7 mmol/L (ref 20.0–28.0)
Drawn by: 700281
O2 Saturation: 99.3 %
Patient temperature: 36.8
pCO2, Ven: 46 mmHg (ref 44–60)
pH, Ven: 7.32 (ref 7.25–7.43)
pO2, Ven: 100 mmHg — ABNORMAL HIGH (ref 32–45)

## 2023-03-19 LAB — RAPID URINE DRUG SCREEN, HOSP PERFORMED
Amphetamines: NOT DETECTED
Barbiturates: NOT DETECTED
Benzodiazepines: NOT DETECTED
Cocaine: NOT DETECTED
Opiates: NOT DETECTED
Tetrahydrocannabinol: NOT DETECTED

## 2023-03-19 LAB — SALICYLATE LEVEL: Salicylate Lvl: <7 mg/dL — ABNORMAL LOW (ref 7.0–30.0)

## 2023-03-19 LAB — ETHANOL: Alcohol, Ethyl (B): <10 mg/dL (ref <10)

## 2023-03-19 LAB — CK: Total CK: 75 U/L (ref 38–234)

## 2023-03-19 LAB — TSH
TSH: 0.56 u[IU]/mL (ref 0.350–4.500)
TSH: 0.408 u[IU]/mL (ref 0.350–4.500)

## 2023-03-19 LAB — LACTIC ACID, PLASMA
Lactic Acid, Venous: 2.3 mmol/L (ref 0.5–1.9)
Lactic Acid, Venous: 1.4 mmol/L (ref 0.5–1.9)

## 2023-03-19 LAB — HCG, SERUM, QUALITATIVE: Preg, Serum: NEGATIVE

## 2023-03-19 LAB — ACETAMINOPHEN LEVEL: Acetaminophen (Tylenol), Serum: <10 ug/mL — ABNORMAL LOW (ref 10–30)

## 2023-03-19 LAB — CBG MONITORING, ED: Glucose-Capillary: 134 mg/dL — ABNORMAL HIGH (ref 70–99)

## 2023-03-19 MED ORDER — LACTATED RINGERS IV SOLN: INTRAVENOUS | Status: AC

## 2023-03-19 MED ORDER — NALOXONE HCL 0.4 MG/ML IJ SOLN
0.4000 mg | Freq: Once | INTRAMUSCULAR | Status: AC
  Administered 2023-03-19: 0.4 mg via INTRAVENOUS
  Filled 2023-03-19: qty 1

## 2023-03-19 MED ORDER — ENOXAPARIN SODIUM 40 MG/0.4ML IJ SOSY
40.0000 mg | PREFILLED_SYRINGE | INTRAMUSCULAR | Status: DC
  Administered 2023-03-19 – 2023-03-21 (×3): 40 mg via SUBCUTANEOUS
  Filled 2023-03-19 (×3): qty 0.4

## 2023-03-19 MED ORDER — LACTATED RINGERS IV BOLUS
1000.0000 mL | Freq: Once | INTRAVENOUS | Status: AC
  Administered 2023-03-19: 1000 mL via INTRAVENOUS
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
## 2023-03-19 NOTE — Progress Notes (Signed)
FMTS Brief Progress Note  S:Met patient bedside with Dr Barbaraann Faster. Introduced ourselves as the night team. Pt's mother was bedside. Pt was having intermittent wakefulness and somnolence. She said she was hungry, and had no further complaints.  O: BP 98/66 (BP Location: Left Arm)   Pulse 81   Temp 98.2 F (36.8 C) (Axillary)   Resp 12   LMP 02/16/2023   SpO2 92%    General: Drowsy but pleasant woman. NAD. HEENT: NCAT. MMM. CV: RRR, no murmurs. Cap refill <2. Resp: CTAB, no wheezing or crackles. Normal WOB on RA. Pt has depressed respiratory rate of around 10-12.  Abm: Soft, nontender, nondistended. BS present. Ext: Moves all ext spontaneously Skin: Warm, well perfused   A/P: Concern for Opioid-induced respiratory depression Pt had depressed respiratory rate with a few witnessed apneic episodes. Differential would be most likely from a long-acting opioid or a benzodiazepine. Given mixed reports of patient improvement to previous dose of Narcan, would prefer to use that rather than flumenazil. - Reordered 0.4 mg IV dose of Narcan. - Orders reviewed. Labs for AM ordered, which was adjusted as needed.  - If condition changes, plan includes additional dose of Narcan, Low threshold for RT involvement.  Margaretmary Dys, MD 03/19/2023, 9:02 PM PGY-1, [facility] Night Resident  Please page [phone] with questions.

## 2023-03-19 NOTE — Progress Notes (Signed)
Spoke with Poison Control and provided pt's med list and test results with them. They recommend Narcan 0.4mg  bolus and Narcan drip of 1.2 mg/hr if patient has any respiratory depression with significantly decreased respiratory rates. The prior respiratory rates documented were not accurate. Will continue to monitor pt's status.

## 2023-03-19 NOTE — ED Notes (Signed)
Pt unable to answer suicide questions at this time. RN will ask when pt is more awake.

## 2023-03-19 NOTE — Assessment & Plan Note (Addendum)
Expressed suicidal ideations to friend over text, it appears that her AMS is likely due to an overdose.  History of suicidal ideation but no history of attempts.  Recent stressors include break-up with girlfriend. -When patient is more alert, will obtain more information and add suicidal precautions -Consider psych consult when patient is alert and medically stable

## 2023-03-19 NOTE — ED Provider Notes (Signed)
[facility] EMERGENCY DEPARTMENT AT [facility] Provider Note   CSN: [id number] Arrival date & time: 03/19/23  1123     History  Chief Complaint  Patient presents with   Drug Overdose    Deanna [name] is a 27 y.o. female.  27 yo F with a chief complaints of altered mental status.  The patient reportedly was a GCS 3 with agonal respirations and pinpoint pupils.  Was in that state for at least 20 minutes.  Received Narcan on scene and reportedly had no improvement and then had subsequently woke up on the ride here.  Not yet talking.  Felt warm to the touch per EMS.  Patient is not verbal for me on exam.  Level 5 caveat altered mental status.   Drug Overdose       Home Medications Prior to Admission medications   Medication Sig Start Date End Date Taking? Authorizing Provider  albuterol (PROVENTIL HFA;VENTOLIN HFA) 108 (90 Base) MCG/ACT inhaler Inhale into the lungs every 6 (six) hours as needed for wheezing or shortness of breath.    [provider]  cetirizine (ZYRTEC ALLERGY) 10 MG tablet Take 1 tablet (10 mg total) by mouth daily. 05/08/21   Fondaw, Rodrigo Ran, PA  fluticasone (FLONASE) 50 MCG/ACT nasal spray Place 2 sprays into both nostrils daily for 14 days. 05/08/21 05/22/21  Gailen Shelter, PA  HYDROcodone-acetaminophen (NORCO/VICODIN) 5-325 MG tablet Take 1 tablet by mouth every 6 (six) hours as needed for severe pain (for breakthrough pain). 10/30/17   Zadie Rhine, MD  hydrocortisone cream 1 % Apply to affected area 2 times daily for 7-10 days 02/18/21   Ernie Avena, MD  ibuprofen (ADVIL,MOTRIN) 600 MG tablet Take 1 tablet (600 mg total) by mouth every 6 (six) hours as needed. 04/19/18   Jacalyn Lefevre, MD  lamoTRIgine (LAMICTAL) 25 MG tablet Take 25 mg by mouth 2 (two) times daily. 02/06/23   [provider]  metroNIDAZOLE (FLAGYL) 500 MG tablet Take 1 tablet (500 mg total) by mouth 2 (two) times daily. 02/23/23   Ernie Avena, MD   mirtazapine (REMERON) 15 MG tablet Take 15 mg by mouth at bedtime.    [provider]  omeprazole (PRILOSEC) 20 MG capsule Take 1 capsule (20 mg total) by mouth 2 (two) times daily before a meal. 07/29/21 08/28/21  Jeannie Fend, PA-C  sertraline (ZOLOFT) 25 MG tablet Take 25 mg by mouth daily. 02/06/23   [provider]  traZODone (DESYREL) 50 MG tablet Take 50-100 mg by mouth at bedtime. 02/06/23   [provider]  triamcinolone ointment (KENALOG) 0.5 % Apply 1 application topically 2 (two) times daily. 05/08/21   Gailen Shelter, PA      Allergies    Dairy aid [tilactase], Peanut (diagnostic), and Penicillins    Review of Systems   Review of Systems  Physical Exam Updated Vital Signs BP 101/77   Pulse (!) 114   Resp 13   LMP 02/16/2023   SpO2 100%  Physical Exam Vitals and nursing note reviewed.  Constitutional:      General: She is not in acute distress.    Appearance: She is well-developed. She is not diaphoretic.  HENT:     Head: Normocephalic and atraumatic.  Eyes:     Pupils: Pupils are equal, round, and reactive to light.  Cardiovascular:     Rate and Rhythm: Normal rate and regular rhythm.     Heart sounds: No murmur heard.  No friction rub. No gallop.  Pulmonary:     Effort: Pulmonary effort is normal.     Breath sounds: No wheezing or rales.  Abdominal:     General: There is no distension.     Palpations: Abdomen is soft.     Tenderness: There is no abdominal tenderness.  Musculoskeletal:        General: No tenderness.     Cervical back: Normal range of motion and neck supple.  Skin:    General: Skin is warm and dry.  Neurological:     Comments: Patient awakes to sternal rub and trap pinch.  She looks glaringly at what ever direction she is feeling of painful stimuli.  Psychiatric:        Behavior: Behavior normal.     ED Results / Procedures / Treatments   Labs (all labs ordered are listed, but only abnormal results are  displayed) Labs Reviewed  COMPREHENSIVE METABOLIC PANEL - Abnormal; Notable for the following components:      Result Value   Glucose, Bld 135 (*)    Creatinine, Ser 2.09 (*)    Calcium 8.2 (*)    GFR, Estimated 32 (*)    All other components within normal limits  CBC WITH DIFFERENTIAL/PLATELET - Abnormal; Notable for the following components:   WBC 13.1 (*)    Neutro Abs 11.8 (*)    All other components within normal limits  ACETAMINOPHEN LEVEL - Abnormal; Notable for the following components:   Acetaminophen (Tylenol), Serum <10 (*)    All other components within normal limits  SALICYLATE LEVEL - Abnormal; Notable for the following components:   Salicylate Lvl <7.0 (*)    All other components within normal limits  CBG MONITORING, ED - Abnormal; Notable for the following components:   Glucose-Capillary 134 (*)    All other components within normal limits  ETHANOL  RAPID URINE DRUG SCREEN, HOSP PERFORMED  HCG, SERUM, QUALITATIVE  CK    EKG EKG Interpretation Date/Time:  Thursday March 19 2023 11:30:50 EDT Ventricular Rate:  123 PR Interval:  122 QRS Duration:  67 QT Interval:  313 QTC Calculation: 448 R Axis:   59  Text Interpretation: Sinus tachycardia No significant change since last tracing Confirmed by Melene Plan [phone]) on 03/19/2023 11:45:56 AM  Radiology No results found.  Procedures .Critical Care  Performed by: Melene Plan, DO Authorized by: Melene Plan, DO   Critical care provider statement:    Critical care time (minutes):  35   Critical care time was exclusive of:  Separately billable procedures and treating other patients   Critical care was time spent personally by me on the following activities:  Development of treatment plan with patient or surrogate, discussions with consultants, evaluation of patient's response to treatment, examination of patient, ordering and review of laboratory studies, ordering and review of radiographic studies, ordering and  performing treatments and interventions, pulse oximetry, re-evaluation of patient's condition and review of old charts   Care discussed with: admitting provider       Medications Ordered in ED Medications  lactated ringers infusion ( Intravenous New Bag/Given 03/19/23 1307)  naloxone [facility]) injection 0.4 mg (0.4 mg Intravenous Given 03/19/23 1145)  lactated ringers bolus 1,000 mL (1,000 mLs Intravenous New Bag/Given 03/19/23 1305)    ED Course/ Medical Decision Making/ A&P  Medical Decision Making Amount and/or Complexity of Data Reviewed Labs: ordered. Radiology: ordered.  Risk Prescription drug management. Decision regarding hospitalization.   27 yo F with a chief complaints of altered mental status.  Per EMS the presumption is that she attempted suicide after break-up yesterday.  She was sleeping in a car last night and this morning her friend that was staying with her went to go get breakfast and when they came back found her unresponsive.  She received 3 mg in total of Narcan with improvement.  Still quite sleepy on my initial exam.  Will obtain a laboratory evaluation chest x-ray.  Observe.  Patient with negative Tylenol and salicylates.  Patient's renal function is significantly worse than baseline.  I am unsure of the exact cause of this.  I wonder if the patient took some cocaine as well.  Will obtain a CK.  Started on IV fluids.  I discussed the case with medicine for admission.   The patients results and plan were reviewed and discussed.   Any x-rays performed were independently reviewed by myself.   Differential diagnosis were considered with the presenting HPI.  Medications  lactated ringers infusion ( Intravenous New Bag/Given 03/19/23 1307)  naloxone [facility]) injection 0.4 mg (0.4 mg Intravenous Given 03/19/23 1145)  lactated ringers bolus 1,000 mL (1,000 mLs Intravenous New Bag/Given 03/19/23 1305)    Vitals:   03/19/23 1130  03/19/23 1145 03/19/23 1200  BP: 112/71 102/79 101/77  Pulse: (!) 125 (!) 117 (!) 114  Resp: 17 10 13   SpO2: 98% 91% 100%    Final diagnoses:  Opiate overdose, undetermined intent, initial encounter [facility])    Admission/ observation were discussed with the admitting physician, patient and/or family and they are comfortable with the plan.          Final Clinical Impression(s) / ED Diagnoses Final diagnoses:  Opiate overdose, undetermined intent, initial encounter [facility])    Rx / DC Orders ED Discharge Orders     None         Melene Plan, DO 03/19/23 1308
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
## 2023-03-19 NOTE — Assessment & Plan Note (Addendum)
Creatinine 2.09 in the ED.  Previous creatinines have been normal.  Suspect this to be due to ingestion of a toxin (intra-renal) or dehydration (pre-renal). -s/p 1L LR bolus, will add another 1L LR bolus -continue LR 131ml/h -Avoid nephrotoxic agents -am BMP

## 2023-03-19 NOTE — Hospital Course (Addendum)
DEAUN ROCHA is a 27 y.o.female with a history of bipolar disorder, asthma who was admitted to the [facility] Medicine Teaching Service at [facility] for AMS and AKI. Her hospital course is detailed below:  AMS Patient presented with altered mental status after suspected overdose in the setting of suicide attempt.  EMS found patient unresponsive with a GCS of 3.  Patient given Narcan several times throughout admission with minimal improvement of symptoms.  CMP unremarkable except for AKI.  VBG unremarkable, CK normal, CBC with mild leukocytosis at 3.1 and patient is afebrile.  Troponins elevated but trended flat.  Lactic acid elevated initially but trended down.  TSH normal. U/A***.  UDS, EtOH, acetaminophen, salicylate levels normal.  EKG is unremarkable.  CT head with relatively symmetric hypodensity within bilateral basal ganglia/pallidum.  Ammonia, folate, HIV, RPR, B1, B12 ***.  EEG***.  Psychiatry was consulted due to suspected suicide attempt, recommended ***  AKI Patient with creatinine of 2.09 initially thought to be prerenal in the setting of hypovolemia.  Received 2 LR boluses initially and maintained on LR 125 mL/h.   Other chronic conditions were medically managed with home medications and formulary alternatives as necessary (allergies, asthma)  PCP Follow-up Recommendations:
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
## 2023-03-19 NOTE — ED Notes (Signed)
Patient transported to CT 

## 2023-03-19 NOTE — Plan of Care (Signed)
Problem: Clinical Measurements: Goal: Ability to maintain clinical measurements within normal limits will improve Outcome: Progressing Goal: Diagnostic test results will improve Outcome: Progressing Goal: Respiratory complications will improve Outcome: Progressing Goal: Cardiovascular complication will be avoided Outcome: Progressing   Problem: Activity: Goal: Risk for activity intolerance will decrease Outcome: Progressing   Problem: Coping: Goal: Level of anxiety will decrease Outcome: Progressing   Problem: Pain Managment: Goal: General experience of comfort will improve Outcome: Progressing   Problem: Safety: Goal: Ability to remain free from injury will improve Outcome: Progressing

## 2023-03-20 ENCOUNTER — Encounter (HOSPITAL_COMMUNITY): Payer: Self-pay | Admitting: Family Medicine

## 2023-03-20 ENCOUNTER — Inpatient Hospital Stay (HOSPITAL_COMMUNITY): Payer: Medicaid Other

## 2023-03-20 DIAGNOSIS — F411 Generalized anxiety disorder: Secondary | ICD-10-CM | POA: Diagnosis not present

## 2023-03-20 DIAGNOSIS — R4182 Altered mental status, unspecified: Secondary | ICD-10-CM | POA: Diagnosis not present

## 2023-03-20 DIAGNOSIS — F17210 Nicotine dependence, cigarettes, uncomplicated: Secondary | ICD-10-CM | POA: Diagnosis not present

## 2023-03-20 DIAGNOSIS — F315 Bipolar disorder, current episode depressed, severe, with psychotic features: Secondary | ICD-10-CM | POA: Diagnosis not present

## 2023-03-20 DIAGNOSIS — R569 Unspecified convulsions: Secondary | ICD-10-CM | POA: Diagnosis not present

## 2023-03-20 DIAGNOSIS — Z79899 Other long term (current) drug therapy: Secondary | ICD-10-CM | POA: Diagnosis not present

## 2023-03-20 DIAGNOSIS — E559 Vitamin D deficiency, unspecified: Secondary | ICD-10-CM | POA: Diagnosis not present

## 2023-03-20 DIAGNOSIS — T40412A Poisoning by fentanyl or fentanyl analogs, intentional self-harm, initial encounter: Secondary | ICD-10-CM | POA: Diagnosis not present

## 2023-03-20 DIAGNOSIS — N179 Acute kidney failure, unspecified: Secondary | ICD-10-CM | POA: Diagnosis not present

## 2023-03-20 DIAGNOSIS — Z91011 Allergy to milk products: Secondary | ICD-10-CM | POA: Diagnosis not present

## 2023-03-20 DIAGNOSIS — G4710 Hypersomnia, unspecified: Secondary | ICD-10-CM | POA: Diagnosis not present

## 2023-03-20 DIAGNOSIS — R0681 Apnea, not elsewhere classified: Secondary | ICD-10-CM | POA: Diagnosis not present

## 2023-03-20 LAB — URINALYSIS, ROUTINE W REFLEX MICROSCOPIC
Bilirubin Urine: NEGATIVE
Glucose, UA: 50 mg/dL — AB
Hgb urine dipstick: NEGATIVE
Ketones, ur: 20 mg/dL — AB
Leukocytes,Ua: NEGATIVE
Nitrite: NEGATIVE
Protein, ur: 30 mg/dL — AB
Specific Gravity, Urine: 1.025 (ref 1.005–1.030)
pH: 5 (ref 5.0–8.0)

## 2023-03-20 LAB — BASIC METABOLIC PANEL
Anion gap: 9 (ref 5–15)
BUN: 10 mg/dL (ref 6–20)
CO2: 24 mmol/L (ref 22–32)
Calcium: 8.4 mg/dL — ABNORMAL LOW (ref 8.9–10.3)
Chloride: 103 mmol/L (ref 98–111)
Creatinine, Ser: 1.08 mg/dL — ABNORMAL HIGH (ref 0.44–1.00)
GFR, Estimated: >60 mL/min (ref >60)
Glucose, Bld: 85 mg/dL (ref 70–99)
Potassium: 3.2 mmol/L — ABNORMAL LOW (ref 3.5–5.1)
Sodium: 136 mmol/L (ref 135–145)

## 2023-03-20 LAB — HIV ANTIBODY (ROUTINE TESTING W REFLEX): HIV Screen 4th Generation wRfx: NONREACTIVE

## 2023-03-20 LAB — VITAMIN B12: Vitamin B-12: 395 pg/mL (ref 180–914)

## 2023-03-20 LAB — FOLATE: Folate: 8.6 ng/mL (ref >5.9)

## 2023-03-20 LAB — VITAMIN D 25 HYDROXY (VIT D DEFICIENCY, FRACTURES): Vit D, 25-Hydroxy: 16.72 ng/mL — ABNORMAL LOW (ref 30–100)

## 2023-03-20 LAB — AMMONIA: Ammonia: 60 umol/L — ABNORMAL HIGH (ref 9–35)

## 2023-03-20 MED ORDER — POTASSIUM CHLORIDE 20 MEQ PO PACK
40.0000 meq | PACK | Freq: Once | ORAL | Status: AC
  Administered 2023-03-20: 40 meq via ORAL
  Filled 2023-03-20: qty 2

## 2023-03-20 MED ORDER — LACTATED RINGERS IV SOLN: INTRAVENOUS | Status: DC

## 2023-03-20 NOTE — Assessment & Plan Note (Addendum)
Expressed suicidal ideations to friend over text, it appears that her AMS is likely due to an overdose.  History of suicidal ideation but no history of attempts.  Recent stressors include break-up with girlfriend. -suicide precautions -consult psych due to suicide attempt with hx bipolar and possibly schizophrenia, appreciate recs

## 2023-03-20 NOTE — Procedures (Signed)
Patient Name: Deanna [name]  MRN: [id number]  Epilepsy Attending: Charlsie Quest  Referring Physician/Provider: Lincoln Brigham, MD  Date: 03/20/2023 Duration: 23.32 mins  Patient history: 27yo F with ams getting eeg to evaluate for seizure  Level of alertness: Awake, asleep  AEDs during EEG study: None  Technical aspects: This EEG study was done with scalp electrodes positioned according to the 10-20 International system of electrode placement. Electrical activity was reviewed with band pass filter of 1-70Hz , sensitivity of 7 uV/mm, display speed of 68mm/sec with a 60Hz  notched filter applied as appropriate. EEG data were recorded continuously and digitally stored.  Video monitoring was available and reviewed as appropriate.  Description: The posterior dominant rhythm consists of 8 Hz activity of moderate voltage (25-35 uV) seen predominantly in posterior head regions, symmetric and reactive to eye opening and eye closing. Sleep was characterized by vertex waves,maximal frontocentral region. Hyperventilation did not show any EEG change. Physiologic photic driving was not seen during photic stimulation.    IMPRESSION: This study is within normal limits. No seizures or epileptiform discharges were seen throughout the recording.  A normal interictal EEG does not exclude he diagnosis of epilepsy.  Deanna [name] [name]

## 2023-03-20 NOTE — Progress Notes (Signed)
EEG complete - results pending 

## 2023-03-20 NOTE — Assessment & Plan Note (Addendum)
AMS suspected to be secondary to medication overdose in the setting of a suicide attempt.  Last known normal around 12:30 AM 9/26.  S/p 4 doses of Narcan in total throughout admission, recommended by poison control for respiratory depression. UDS, EtOH, acetaminophen level, salicylate level normal. Afebrile, no leukocytosis. VBG normal.  Lactic acid initially elevated at 2.3 but trended down.  Trops elevated but trended flat.  TSH normal.  EKG is overall unremarkable.  CT head with relatively symmetric hypodensity within bilateral basal ganglia/pallidum. Patient does have a history of overdose on Remeron and Haldol in 2017.   -psych consult, appreciate recs -Suicide precautions -EEG -Ammonia, Folate, HIV, RPR, B1, B12 -U/A still to be collected - of urinary output 9/26 -Continue LR infusion 142ml/hr -Start regular diet today -Neuro checks q4h -Restrict visitors, Please do NOT allow "Jennifer/Jen" to visit pt. -Fall precautions

## 2023-03-20 NOTE — Progress Notes (Signed)
Daily Progress Note Intern Pager: [phone]  Patient name: Deanna [name] Medical record number: [id number] Date of birth: [birth year] Age: 27 y.o. Gender: female  Primary Care Provider: Pcp, No Consultants: psychiatry Code Status: Full  Pt Overview and Major Events to Date:  9/26 - admitted  Assessment and Plan:  Deanna [name] is a 27 y.o. female with PMHx bipolar disorder, asthma presenting with altered mental status in the setting of potential suicide attempt by ingestion and AKI.  Assessment & Plan Altered mental status AMS suspected to be secondary to medication overdose in the setting of a suicide attempt.  Last known normal around 12:30 AM 9/26.  S/p 4 doses of Narcan in total throughout admission, recommended by poison control for respiratory depression. UDS, EtOH, acetaminophen level, salicylate level normal. Afebrile, no leukocytosis. VBG normal.  Lactic acid initially elevated at 2.3 but trended down.  Trops elevated but trended flat.  TSH normal.  EKG is overall unremarkable.  CT head with relatively symmetric hypodensity within bilateral basal ganglia/pallidum. Patient does have a history of overdose on Remeron and Haldol in 2017.   -psych consult, appreciate recs -Suicide precautions -EEG -Ammonia, Folate, HIV, RPR, B1, B12 -U/A still to be collected - of urinary output 9/26 -Continue LR infusion 181ml/hr -Start regular diet today -Neuro checks q4h -Restrict visitors, Please do NOT allow "Deanna [name]/Deanna [name]" to visit pt. -Fall precautions Acute kidney injury (HCC) Creatinine 2.09 in the ED.  Previous creatinines have been normal.  Suspect this to be due to ingestion of a toxin (intra-renal) or dehydration (pre-renal). Still pending morning lab draw.  -s/p 2L LR boluses -continue LR 152ml/h until pt having appropriate PO intake -Avoid nephrotoxic agents -am BMP Suicidal behavior with attempted self-injury [facility]) Expressed suicidal ideations to friend over  text, it appears that her AMS is likely due to an overdose.  History of suicidal ideation but no history of attempts.  Recent stressors include break-up with girlfriend. -suicide precautions -consult psych due to suicide attempt with hx bipolar and possibly schizophrenia, appreciate recs  Chronic and Stable Problems:  Bipolar affective disorder - holding home meds currently (lamictal, zoloft, remeron/trazodone at bedtime) Allergy - holding home meds currently, can restart once more alert Asthma - holding home meds currently, can restart once more alert  FEN/GI: NPO PPx: lovenox Dispo: pending clinical improvement   Subjective:  Patient much more alert this morning than when I first saw her up admission.  States that the last thing she remembers is falling asleep in a car after taking 4 pills out of an Advil bottle and one of her Lamictal.  States that she had not slept in 2 days prior to falling asleep in the car.  Reports that she did not get into "that bad of a fight" with her girlfriend.  Reports that she lives with an individual named Deanna [name] [phone]). States she is not suicidal currently and reports to have been taking her psych meds as prescribed. States that she is hungry.  Objective: Temp:  [97.2 F (36.2 C)-98.6 F (37 C)] 98.4 F (36.9 C) (09/27 0753) Pulse Rate:  [47-125] 72 (09/27 0753) Resp:  [7-22] 16 (09/27 0753) BP: (92-112)/(61-94) 101/74 (09/27 0753) SpO2:  [91 %-100 %] 97 % (09/27 0753) Physical Exam: General: Sleepy, but alert.  No acute distress.  Cardiovascular: RRR, no murmurs Respiratory: CTAB, no increased work of breathing on room air.  Normal respiration rate. Abdomen: Soft, nontender Extremities: No swelling BLE Neuro: Awake and alert, oriented x  3.  Significantly more alert than admission and now conversational.  No vision changes, EOMI, sensation intact bilaterally, facial expression symmetric, hearing intact, symmetric palate with midline tongue.   Regular speech.  Normal finger-to-nose testing.  5 out of 5 strength BUE and BLE.  Laboratory: Most recent CBC Lab Results  Component Value Date   WBC 13.1 (H) 03/19/2023   HGB 12.6 03/19/2023   HCT 37.0 03/19/2023   MCV 92.2 03/19/2023   PLT 293 03/19/2023   Most recent BMP    Latest Ref Rng & Units 03/19/2023    3:55 PM  BMP  Sodium 135 - 145 mmol/L 140   Potassium 3.5 - 5.1 mmol/L 4.4    Imaging/Diagnostic Tests: CXR 03/19/23 IMPRESSION: No acute cardiopulmonary process.  CT Head WO Contrast IMPRESSION: Relatively symmetric hypodensity within the bilateral basal ganglia/pallidum. Differential considerations include toxicity, metabolic disease, and hypoxemia. Correlation with MRI may be obtained as indicated  Deanna Boquet, DO 03/20/2023, 11:01 AM  PGY-1, [facility] FPTS Intern pager: [phone], text pages welcome Secure chat group [facility] Teaching Service

## 2023-03-20 NOTE — Plan of Care (Signed)

## 2023-03-20 NOTE — TOC Initial Note (Signed)
Transition of Care [facility]) - Initial/Assessment Note    Patient Details  Name: Deanna [name] MRN: [id number] Date of Birth: 10-09-1995  Transition of Care [facility]) CM/SW Contact:    Durenda Guthrie, RN Phone Number: 03/20/2023, 10:57 AM  Clinical Narrative:                 Brief assessment completed, patient says she doesn't have a primary, can setup follow up appt when closer to discharge.   Expected Discharge Plan:  (to be determined)     Patient Goals and CMS Choice            Expected Discharge Plan and Services                                              Prior Living Arrangements/Services                       Activities of Daily Living   ADL Screening (condition at time of admission) Does the patient have a NEW difficulty with bathing/dressing/toileting/self-feeding that is expected to last >3 days?: No Does the patient have a NEW difficulty with getting in/out of bed, walking, or climbing stairs that is expected to last >3 days?: No Does the patient have a NEW difficulty with communication that is expected to last >3 days?: No Is the patient deaf or have difficulty hearing?: No Does the patient have difficulty seeing, even when wearing glasses/contacts?: No Does the patient have difficulty concentrating, remembering, or making decisions?: No  Permission Sought/Granted                  Emotional Assessment              Admission diagnosis:  Altered mental status [R41.82] Acute kidney injury (HCC) [N17.9] Opiate overdose, undetermined intent, initial encounter (HCC) [T40.604A] Altered mental status, unspecified altered mental status type [R41.82] Suicidal behavior with attempted self-injury [facility]) [T14.91XA] Patient Active Problem List   Diagnosis Date Noted   Altered mental status 03/19/2023   Suicidal behavior with attempted self-injury (HCC) 03/19/2023   Acute kidney injury (HCC) 03/19/2023   Drug overdose    Bipolar 1  disorder (HCC) 06/02/2016   Bipolar affective disorder, current episode depressed (HCC) 06/02/2016   OVARIAN CYST, RIGHT 06/30/2008   ECZEMA, ATOPIC DERMATITIS 08/20/2006   PCP:  Pcp, No Pharmacy:   [id number] - [place], [place] - [address] AT [address] [place] [address] Phone: [phone] Fax: [phone]  [id number] - [place], [place] - [address] AT [facility] [address] [place] [address] Phone: [phone] Fax: [phone]  [id number] - [place], [place] - [address] [place] [address] Phone: [phone] Fax: [phone]  [id number] [place], [place] - [address] AT [facility] [address] [facility] [address] [place] [address] Phone: [phone] Fax: [phone]  [facility] [address] [place] [address] Phone: [phone] Fax: [phone]  [facility] [address] [place], [place] - [address] [place] [address] Phone: [phone] Fax: [phone]  [id number] - [place], [place] - [address] AT [address]  [place] [address] Phone: [phone] Fax: [phone]     Social Determinants of Health (SDOH) Social History: SDOH Screenings   Food Insecurity: No Food Insecurity (03/19/2023)  Housing: Low Risk  (03/19/2023)  Transportation Needs: No Transportation Needs (03/19/2023)  Utilities: Not At Risk (03/19/2023)  Alcohol Screen: Low Risk  (05/05/2017)  Tobacco Use: High Risk (03/20/2023)   SDOH Interventions:     Readmission Risk Interventions     No data to display

## 2023-03-20 NOTE — Consult Note (Addendum)
[facility] Consult Evaluation  Service Date: March 20, 2023 LOS:  LOS: 1 day    Primary Psychiatric Diagnoses  MDD vs. bipolar disorder vs. schizoaffective disorder vs. schizophrenia 2.  Opioid use disorder  Assessment  YLIANA GRAVOIS is a 27 y.o. female admitted medically for 03/19/2023 11:23 AM for altered mental status. She carries the psychiatric diagnoses of schizophrenia and bipolar type I and has a past medical history of ezcema. Psychiatry was consulted for evaluation of altered mental status and risk of self harm by internal medicine.  Her current presentation of altered mental status is most consistent with opioid overdose 2/2 MDD with psychotic features. She meets criteria for MDD based on history of recurrent depressive episodes lasting >2 weeks, no current psychosis, and no reported history of periods of mania. Patient reports history of schizophrenia and bipolar diagnoses. Will continue to ask patient and family for further information about patient's psychiatric history to rule out bipolar disorder, schizoaffective disorder, MDD with psychotic features, and bipolar disorder with psychotic features. Current outpatient psychotropic medications include lamotrigine 25mg  bid and sertraline 25mg  daily, and historically she has had a good response to these medications. She was not compliant with medications prior to admission as evidenced by patient not taking psychiatric medications for the two weeks preceding admission. On initial examination, patient has a flat, depressed affect, and while cooperative with interview initially, stops answering questions after 5 minutes. Please see plan below for detailed recommendations.   Diagnoses:  Active Hospital problems: Principal Problem:   Altered mental status Active Problems:   Suicidal behavior with attempted self-injury (HCC)   Acute kidney injury (HCC)     Plan   ## Psychiatric Medication Recommendations:  --  Restart Zoloft 25mg  -- Discontinue Lamictal since patient does not endorse hypo/mania and is also likely to be noncompliant, placing her at risk for complications including SJS  ## Medical Decision Making Capacity:  -- Not formally evaluated at this time  ## Further Work-up:  TSH, B12, folate  -- most recent EKG on 9/26 had QtC of 448 -- Pertinent labwork reviewed earlier this admission includes: negative urine rapid drug screen, negative ethanol, salicylate, and acetaminophen levels, Troponin I 157, lactic acid 2.3, Cre 2.09  ## Disposition:  -- We recommend inpatient psychiatric hospitalization after medical hospitalization. Patient is under voluntary admission status at this time; please IVC if attempts to leave hospital.  ## Behavioral / Environmental:  --  Utilize compassion and acknowledge the patient's experiences while setting clear and realistic expectations for care.    ## Safety and Observation Level:  - Based on my clinical evaluation, I estimate the patient to be at high risk of self harm in the current setting - At this time, we recommend an enhanced level of observation with a 1:1 sitter. This decision is based on my review of the chart including patient's history and current presentation, interview of the patient, mental status examination, and consideration of suicide risk including evaluating suicidal ideation, plan, intent, suicidal or self-harm behaviors, risk factors, and protective factors. This judgment is based on our ability to directly address suicide risk, implement suicide prevention strategies and develop a safety plan while the patient is in the clinical setting. Please contact our team if there is a concern that risk level has changed.  Suicide risk assessment  Patient has following modifiable risk factors for suicide: under treated depression , social isolation, recklessness, and medication noncompliance, which we are addressing by planning to resume  treatment of underlying psychiatry during .   Patient has following non-modifiable or demographic risk factors for suicide: history of suicide attempt, history of self harm behavior, and psychiatric hospitalization  Patient has the following protective factors against suicide: Access to outpatient mental health care and Supportive family   Thank you for this consult request. Recommendations have been communicated to the primary team.  We will continue to follow the patient at this time.   Hanley Ben, Medical Student  Psychiatric and Social History   Relevant Aspects of Hospital Course:  Admitted on 03/19/2023 for altered mental status. She is alert and communicative with depressed affect.   Patient Report:  Patient was interviewed this morning at bedside. Patient states that her mood is "ok" and denies feeling depressed recently. States that she does not remember most of the events of yesterday. Reports she remembers waking up in the hospital and that she remembers hanging out with her friend Denyse Amass in a car the night before. Denies using any substances the night before. Patient states that she does not think she was trying to kill herself.  I asked the patient about her medications, and she states that she has been taking two new psychiatric medications "Lamictal and one I can't remember the name of". Patient is receiving psychiatric care at [facility]. Patient states she received care with [facility] back in 2017.  After 5 minutes of interviewing, patient declines to answer further questions.  Psych ROS:  Depression: mood is "fine", affect depressed and flat Anxiety:  "no" Mania (lifetime and current): patient denies but patient reports she has been diagnosed with bipolar disorder Psychosis: (lifetime and current): patient denies but patient reports she was diagnosed with schizophrenia during time in prison  Collateral information:  Spoke with mother, father, and sister in  person today. Mother states "I know for a fact she was trying to kill herself. Whenever she gets off her medications, she starts to spiral". Mother reports that when patient first regained consciousness, she stated that she had taken "a lot of fentanyl".  Patient's family is concerned for her safety if she were to leave the hospital now.  Psychiatric History:  Information collected from patient, mother, and chart review.  Prev Dx/Sx: bipolar disorder, schizophrenia  Current Psych Provider: [facility] Meds (current): none Previous Med Trials: N/A Therapy: none  Prior ECT: no Prior Psych Hospitalization: 05/2016 at [facility] for SA by Haldol OD  Prior Self Harm: 05/2016 SA by Haldol OD  Prior Violence: no  Family Psych History: mother bipolar disorder, father bipolar disorder Family Hx suicide: no  Social History:  Developmental Hx: N/A Educational Hx: N/A Occupational Hx: N/A Legal Hx: 2 times in prison, no longer on parole or with any outstanding legal issues Living Situation: lives with a friend Spiritual Hx: N/A Access to weapons: unknown  Substance History Tobacco use: 0.5 PPD per chart Alcohol use: no Drug use: presumed fentanyl overdose 03/19/2023   Exam Findings   Psychiatric Specialty Exam:  Presentation  General Appearance: Appropriate for Environment; Casual; Fairly Groomed  Eye Contact:Minimal  Speech:Clear and Coherent; Normal Rate  Speech Volume:Normal  Handedness:Right   Mood and Affect  Mood:Euthymic  Affect:Depressed; Flat   Thought Process  Thought Processes:Coherent; Linear  Descriptions of Associations:Intact  Orientation:Partial (States today is the 26th)  Thought Content:Logical  Hallucinations:Hallucinations: None  Ideas of Reference:None  Suicidal Thoughts:Suicidal Thoughts: No  Homicidal Thoughts:Homicidal Thoughts: No   Sensorium  Memory:Immediate Fair; Recent Poor; Remote Fair  Judgment:Poor (Presumed suicide  attempt yesterday)  Insight:Good   Executive Functions  Concentration:Fair  Attention Span:Fair  Recall:Fair  Fund of Knowledge:Good  Language:Good   Psychomotor Activity  Psychomotor Activity:Psychomotor Activity: Normal   Assets  Assets:Financial Resources/Insurance; Housing   Sleep  Sleep:Sleep: Fair    Physical Exam: Vital signs:  Temp:  [98.2 F (36.8 C)-98.6 F (37 C)] 98.3 F (36.8 C) (09/27 1524) Pulse Rate:  [64-99] 64 (09/27 1524) Resp:  [12-22] 18 (09/27 1524) BP: (93-107)/(59-94) 93/62 (09/27 1524) SpO2:  [92 %-100 %] 97 % (09/27 1524) Physical Exam Constitutional:      Appearance: Normal appearance.  HENT:     Head: Normocephalic and atraumatic.     Nose: Nose normal.     Mouth/Throat:     Mouth: Mucous membranes are moist.     Pharynx: Oropharynx is clear.  Eyes:     Conjunctiva/sclera: Conjunctivae normal.     Pupils: Pupils are equal, round, and reactive to light.  Cardiovascular:     Rate and Rhythm: Normal rate.  Pulmonary:     Effort: Pulmonary effort is normal.  Abdominal:     General: Abdomen is flat.     Palpations: Abdomen is soft.  Musculoskeletal:        General: Normal range of motion.     Cervical back: Normal range of motion and neck supple.  Skin:    General: Skin is warm and dry.  Neurological:     General: No focal deficit present.     Mental Status: She is alert and oriented to person, place, and time.     Blood pressure 93/62, pulse 64, temperature 98.3 F (36.8 C), temperature source Oral, resp. rate 18, last menstrual period 02/16/2023, SpO2 97%. There is no height or weight on file to calculate BMI.   Other History   These have been pulled in through the EMR, reviewed, and updated if appropriate.   Family History:  The patient's family history includes Asthma in her father, mother, and paternal grandmother; Bipolar disorder in her father and mother; Diabetes in her paternal grandmother; Hypertension in  her father and paternal grandmother.  Medical History: Past Medical History:  Diagnosis Date   Allergy    Anxiety    Asthma    Depression    Eczema    Ovarian cyst    resolved    Surgical History: Past Surgical History:  Procedure Laterality Date   OVARIAN CYST REMOVAL      Medications:   Current Facility-Administered Medications:    enoxaparin (LOVENOX) injection 40 mg, 40 mg, Subcutaneous, Q24H, Everhart, Kirstie, DO, 40 mg at 03/19/23 1718  Allergies: Allergies  Allergen Reactions   Dairy Aid [Tilactase] Hives        Peanut (Diagnostic) Other (See Comments)    Unknown reaction   Penicillins Rash

## 2023-03-20 NOTE — Plan of Care (Signed)

## 2023-03-20 NOTE — Assessment & Plan Note (Addendum)
Creatinine 2.09 in the ED.  Previous creatinines have been normal.  Suspect this to be due to ingestion of a toxin (intra-renal) or dehydration (pre-renal). Still pending morning lab draw.  -s/p 2L LR boluses -continue LR 117ml/h until pt having appropriate PO intake -Avoid nephrotoxic agents -am BMP

## 2023-03-21 ENCOUNTER — Telehealth: Payer: Self-pay | Admitting: Family Medicine

## 2023-03-21 DIAGNOSIS — R4182 Altered mental status, unspecified: Secondary | ICD-10-CM | POA: Diagnosis not present

## 2023-03-21 DIAGNOSIS — E559 Vitamin D deficiency, unspecified: Secondary | ICD-10-CM | POA: Insufficient documentation

## 2023-03-21 DIAGNOSIS — N179 Acute kidney failure, unspecified: Secondary | ICD-10-CM

## 2023-03-21 LAB — BASIC METABOLIC PANEL
Anion gap: 5 (ref 5–15)
BUN: 6 mg/dL (ref 6–20)
CO2: 27 mmol/L (ref 22–32)
Calcium: 8.5 mg/dL — ABNORMAL LOW (ref 8.9–10.3)
Chloride: 106 mmol/L (ref 98–111)
Creatinine, Ser: 1.02 mg/dL — ABNORMAL HIGH (ref 0.44–1.00)
GFR, Estimated: >60 mL/min (ref >60)
Glucose, Bld: 81 mg/dL (ref 70–99)
Potassium: 3.3 mmol/L — ABNORMAL LOW (ref 3.5–5.1)
Sodium: 138 mmol/L (ref 135–145)

## 2023-03-21 LAB — RPR: RPR Ser Ql: NONREACTIVE

## 2023-03-21 MED ORDER — VITAMIN D 25 MCG (1000 UNIT) PO TABS
1000.0000 [IU] | ORAL_TABLET | Freq: Every day | ORAL | Status: DC
  Administered 2023-03-21 – 2023-03-22 (×2): 1000 [IU] via ORAL
  Filled 2023-03-21 (×2): qty 1

## 2023-03-21 MED ORDER — POTASSIUM CHLORIDE 20 MEQ PO PACK
40.0000 meq | PACK | Freq: Once | ORAL | Status: AC
  Administered 2023-03-21: 40 meq via ORAL
  Filled 2023-03-21: qty 2

## 2023-03-21 NOTE — Plan of Care (Signed)
  Problem: Education: Goal: Knowledge of General Education information will improve Description Including pain rating scale, medication(s)/side effects and non-pharmacologic comfort measures Outcome: Progressing   Problem: Health Behavior/Discharge Planning: Goal: Ability to manage health-related needs will improve Outcome: Progressing   

## 2023-03-21 NOTE — Assessment & Plan Note (Addendum)
Improved. Sleepy, but easily awakens to voice, A&Ox3, and follows commands appropriately. Suspect pt is close to neurologic baseline. Will need inpatient psych once medically stable.  -psych consult, appreciate recs -Suicide precautions - f/u RPR and thiamine -Restrict visitors, Please do NOT allow "Jennifer/Jen" to visit pt. -Fall precautions

## 2023-03-21 NOTE — Telephone Encounter (Signed)
ERROR

## 2023-03-21 NOTE — Assessment & Plan Note (Addendum)
-   Cont home zoloft 25mg  daily - Will need inpt psych (medically stable) - Psych consulted, appreciate recs - Suicide precautions. Sitter ordered.  - Will need IVC if attempts to leave

## 2023-03-21 NOTE — Progress Notes (Addendum)
     Daily Progress Note Intern Pager: [phone]  Patient name: Deanna [name] Medical record number: [id number] Date of birth: 12/26/1995 Age: 27 y.o. Gender: female  Primary Care Provider: Pcp, No Consultants: Psychiatry Code Status: Full  Pt Overview and Major Events to Date:  9/26 Admitted  Assessment and Plan: Deanna [name] is a 27 y.o. female with PMHx bipolar disorder, asthma presenting with altered mental status in the setting of potential suicide attempt by ingestion and AKI. AKI now resolving and mental status improved close to baseline, pt is medically stable for d/c to inpatient psych.  Assessment & Plan Altered mental status Improved. Sleepy, but easily awakens to voice, A&Ox3, and follows commands appropriately. Suspect pt is close to neurologic baseline. Will need inpatient psych once medically stable.  -psych consult, appreciate recs -Suicide precautions - f/u RPR and thiamine -Restrict visitors, Please do NOT allow "Jennifer/Jen" to visit pt. -Fall precautions Acute kidney injury (HCC) Cr improving s/p IVF, now tolerating PO intake. - f/u AM BMP Suicidal behavior with attempted self-injury (HCC) - Cont home zoloft 25mg  daily - Will need inpt psych (medically stable) - Psych consulted, appreciate recs - Suicide precautions. Sitter ordered.  - Will need IVC if attempts to leave Vitamin D deficiency VitD low to 16.72.  - Start cholecalciferol 1000mg  daily  FEN/GI: Regular PPx: Lovenox Dispo: Inpatient psych   pending psych eval. Pt is medically stable for DC .  Subjective:  No complaints this morning.  Reports that she is eating well.  Objective: Temp:  [98.3 F (36.8 C)-98.8 F (37.1 C)] 98.8 F (37.1 C) (09/28 0815) Pulse Rate:  [63-81] 65 (09/28 0815) Resp:  [18] 18 (09/28 0815) BP: (89-116)/(59-79) 101/73 (09/28 0815) SpO2:  [93 %-98 %] 98 % (09/28 0815) Physical Exam: General: Sleepy but awakens to voice.  Laying comfortably in bed, no  acute distress. Neuro: A&O to person, place, time. Follows commands. Responding appropriately.  Cardiovascular: RRR Respiratory: CTAB.  Normal WOB on RA Abdomen: Soft, nontender, nondistended. Normal BS.  Laboratory: Most recent CBC Lab Results  Component Value Date   WBC 13.1 (H) 03/19/2023   HGB 12.6 03/19/2023   HCT 37.0 03/19/2023   MCV 92.2 03/19/2023   PLT 293 03/19/2023   Most recent BMP    Latest Ref Rng & Units 03/20/2023   10:55 AM  BMP  Glucose 70 - 99 mg/dL 85   BUN 6 - 20 mg/dL 10   Creatinine 1.47 - 1.00 mg/dL 8.29   Sodium 562 - 130 mmol/L 136   Potassium 3.5 - 5.1 mmol/L 3.2   Chloride 98 - 111 mmol/L 103   CO2 22 - 32 mmol/L 24   Calcium 8.9 - 10.3 mg/dL 8.4    Lincoln Brigham, MD 03/21/2023, 10:09 AM  PGY-2, [facility] FPTS Intern pager: [phone], text pages welcome Secure chat group [facility] Teaching Service

## 2023-03-21 NOTE — Assessment & Plan Note (Signed)
Cr improving s/p IVF, now tolerating PO intake. - f/u AM BMP

## 2023-03-21 NOTE — Assessment & Plan Note (Signed)
VitD low to 16.72.  - Start cholecalciferol 1000mg  daily

## 2023-03-22 ENCOUNTER — Inpatient Hospital Stay (HOSPITAL_COMMUNITY)
Admission: AD | Admit: 2023-03-22 | Discharge: 2023-03-31 | DRG: 885 | Disposition: A | Discharge status: Home / Self Care | Payer: Medicaid Other | Source: Intra-hospital | Attending: Psychiatry | Admitting: Psychiatry

## 2023-03-22 ENCOUNTER — Other Ambulatory Visit: Payer: Self-pay

## 2023-03-22 ENCOUNTER — Encounter (HOSPITAL_COMMUNITY): Payer: Self-pay | Admitting: Psychiatry

## 2023-03-22 DIAGNOSIS — G4700 Insomnia, unspecified: Secondary | ICD-10-CM | POA: Diagnosis present

## 2023-03-22 DIAGNOSIS — F319 Bipolar disorder, unspecified: Principal | ICD-10-CM | POA: Diagnosis present

## 2023-03-22 DIAGNOSIS — R4182 Altered mental status, unspecified: Secondary | ICD-10-CM | POA: Diagnosis not present

## 2023-03-22 DIAGNOSIS — Z825 Family history of asthma and other chronic lower respiratory diseases: Secondary | ICD-10-CM

## 2023-03-22 DIAGNOSIS — Z818 Family history of other mental and behavioral disorders: Secondary | ICD-10-CM | POA: Diagnosis not present

## 2023-03-22 DIAGNOSIS — N179 Acute kidney failure, unspecified: Secondary | ICD-10-CM | POA: Diagnosis not present

## 2023-03-22 DIAGNOSIS — Z419 Encounter for procedure for purposes other than remedying health state, unspecified: Secondary | ICD-10-CM | POA: Diagnosis not present

## 2023-03-22 DIAGNOSIS — Z8249 Family history of ischemic heart disease and other diseases of the circulatory system: Secondary | ICD-10-CM | POA: Diagnosis not present

## 2023-03-22 DIAGNOSIS — T50902A Poisoning by unspecified drugs, medicaments and biological substances, intentional self-harm, initial encounter: Secondary | ICD-10-CM | POA: Diagnosis present

## 2023-03-22 DIAGNOSIS — F17210 Nicotine dependence, cigarettes, uncomplicated: Secondary | ICD-10-CM | POA: Diagnosis not present

## 2023-03-22 DIAGNOSIS — F411 Generalized anxiety disorder: Secondary | ICD-10-CM | POA: Diagnosis present

## 2023-03-22 DIAGNOSIS — Z91010 Allergy to peanuts: Secondary | ICD-10-CM | POA: Diagnosis not present

## 2023-03-22 DIAGNOSIS — Z833 Family history of diabetes mellitus: Secondary | ICD-10-CM

## 2023-03-22 DIAGNOSIS — K5900 Constipation, unspecified: Secondary | ICD-10-CM | POA: Diagnosis present

## 2023-03-22 DIAGNOSIS — Z880 Allergy status to penicillin: Secondary | ICD-10-CM | POA: Diagnosis not present

## 2023-03-22 DIAGNOSIS — F314 Bipolar disorder, current episode depressed, severe, without psychotic features: Secondary | ICD-10-CM | POA: Diagnosis not present

## 2023-03-22 MED ORDER — MAGNESIUM HYDROXIDE 400 MG/5ML PO SUSP: 30.0000 mL | Freq: Every day | ORAL | Status: DC | PRN

## 2023-03-22 MED ORDER — ALUM & MAG HYDROXIDE-SIMETH 200-200-20 MG/5ML PO SUSP: 30.0000 mL | ORAL | Status: DC | PRN

## 2023-03-22 MED ORDER — VITAMIN D3 25 MCG PO TABS: 1000.0000 [IU] | ORAL_TABLET | Freq: Every day | ORAL | Status: DC

## 2023-03-22 MED ORDER — ZIPRASIDONE MESYLATE 20 MG IM SOLR: 20.0000 mg | INTRAMUSCULAR | Status: DC | PRN

## 2023-03-22 MED ORDER — LORAZEPAM 1 MG PO TABS: 1.0000 mg | ORAL_TABLET | ORAL | Status: DC | PRN

## 2023-03-22 MED ORDER — ACETAMINOPHEN 325 MG PO TABS: 650.0000 mg | ORAL_TABLET | Freq: Four times a day (QID) | ORAL | Status: DC | PRN

## 2023-03-22 MED ORDER — RISPERIDONE 2 MG PO TBDP: 2.0000 mg | ORAL_TABLET | Freq: Three times a day (TID) | ORAL | Status: DC | PRN

## 2023-03-22 NOTE — Discharge Summary (Signed)
Family Medicine Teaching [facility] Discharge Summary  Patient name: Deanna [name] Medical record number: [id number] Date of birth: Oct 24, 1995 Age: 27 y.o. Gender: female Date of Admission: 03/19/2023  Date of Discharge: 03/22/23 Admitting Physician: Para March, DO  Primary Care Provider: Pcp, No Consultants: Psychiatry  Indication for Hospitalization: AMS  Discharge Diagnoses/Problem List:  Principal Problem for Admission: Altered Mental Status Other Problems addressed during stay:  Principal Problem:   Altered mental status Active Problems:   Suicidal behavior with attempted self-injury (HCC)   Acute kidney injury [facility])   Vitamin D deficiency    Brief Hospital Course:  Deanna [name] is a 27 y.o.female with a history of bipolar disorder, asthma who was admitted to the [facility] Medicine Teaching Service at [facility] for AMS and AKI. Her hospital course is detailed below:  AMS  Suspected overdose  Suicide Attempt Patient presented with altered mental status after suspected overdose in the setting of suicide attempt.  EMS found patient unresponsive with a GCS of 3.  Patient given Narcan several times throughout admission with minimal improvement of symptoms.  CMP unremarkable except for AKI.  VBG unremarkable, CK normal, CBC with mild leukocytosis at 3.1 and patient is afebrile.  Troponins elevated but trended flat.  Lactic acid elevated initially but trended down.  TSH normal. U/A unremarkable.  UDS, EtOH, acetaminophen, salicylate levels normal.  EKG is unremarkable.  CT head with relatively symmetric hypodensity within bilateral basal ganglia/pallidum.  Ammonia was 60, folate, HIV, RPR, B12 were normal. B1 is still pending.  EEG negative. Vit D low, so supplementation was given. Psychiatry was consulted due to suspected suicide attempt, recommended inpatient stabilization and then psychiatric hospital admission. Patient is medical stable for discharge and will transition  to psychiatric hospitalization.   AKI Patient with creatinine of 2.09 initially thought to be prerenal in the setting of hypovolemia.  Received 2 LR boluses initially and maintained on LR 125 mL/h. Cr downtrended to 1.02.   Vitamin D deficiency Found to have VitD 25-hydroxy level 16.72, started on VitD supplement.    Other chronic conditions were medically managed with home medications and formulary alternatives as necessary (allergies, asthma)  PCP Follow-up Recommendations: Consider higher dose VitD repletion and monitoring VitD level for improvement.  Repeat BMP on PCP follow up      Disposition: Inpatient Psychiatry  Discharge Condition: stable  Discharge Exam:  Vitals:   03/21/23 1938 03/21/23 2315  BP: 107/68 (!) 106/55  Pulse: 65 64  Resp: 14 16  Temp: 98.4 F (36.9 C) 98.8 F (37.1 C)  SpO2: 100% 98%  Per Dr. Velna Ochs  General: NAD Neuro: A&O, easily arousable Cardiovascular: RRR, no murmurs, no peripheral edema Respiratory: normal WOB on RA, CTAB, no wheezes, ronchi or rales Abdomen: soft, NTTP, no rebound or guarding Extremities: Moving all 4 extremities equally  Significant Procedures:   03/20/23 - EEG Negative.  Significant Labs and Imaging:  No results for input(s): "WBC", "HGB", "HCT", "PLT" in the last 48 hours. Recent Labs  Lab 03/21/23 1031  NA 138  K 3.3*  CL 106  CO2 27  GLUCOSE 81  BUN 6  CREATININE 1.02*  CALCIUM 8.5*    CT Head - Relatively symmetric hypodensity within the bilateral basal ganglia/pallidum. Differential considerations include toxicity, metabolic disease, and hypoxemia. Correlation with MRI may be obtained as indicated.  Results/Tests Pending at Time of Discharge: None  Discharge Medications:  Allergies as of 03/22/2023       Reactions   Dairy  Aid [tilactase] Hives      Peanut (diagnostic) Other (See Comments)   Unknown reaction   Penicillins Rash        Medication List     TAKE these medications     vitamin D3 25 MCG tablet Commonly known as: CHOLECALCIFEROL Take 1 tablet (1,000 Units total) by mouth daily.        Discharge Instructions: Please refer to Patient Instructions section of EMR for full details.  Patient was counseled important signs and symptoms that should prompt return to medical care, changes in medications, dietary instructions, activity restrictions, and follow up appointments.   Follow-Up Appointments:  Follow up with PCP after disposition from inpatient psychiatric hospitalization  Alfredo Martinez, MD 03/22/2023, 12:19 PM PGY-2, [facility]

## 2023-03-22 NOTE — Assessment & Plan Note (Addendum)
Improved. Sleepy, but easily awakens to voice, A&Ox3, and follows commands appropriately. Suspect pt is close to neurologic baseline. Will need inpatient psych once medically stable. RPR negative. -psych consult, appreciate recs -Suicide precautions -Restrict visitors, Please do NOT allow "Jennifer/Jen" to visit pt. -Fall precautions

## 2023-03-22 NOTE — Assessment & Plan Note (Signed)
VitD low to 16.72.  - Start cholecalciferol 1000mg  daily

## 2023-03-22 NOTE — Progress Notes (Signed)
Patient discharged to [facility]. IV removed. Catheter is intact. All belongings with patient. Patient has left via wheelchair with staff. Left in custody of safe transport.

## 2023-03-22 NOTE — TOC Transition Note (Addendum)
Transition of Care [facility]) - CM/SW Discharge Note   Patient Details  Name: Deanna [name] MRN: [id number] Date of Birth: March 31, 1996  Transition of Care [facility]) CM/SW Contact:  Deatra Robinson, [place] Phone Number: 03/22/2023, 4:28 PM   Clinical Narrative: Pt accepted to [facility], room 306-2. Accepting physician is Dr. Sherron Flemings. RN to call report to [phone]. Voluntary consent uploaded to EMR. Safe transport arranged [phone].   Dellie Burns, MSW, LCSW [phone] (coverage)      Final next level of care: Psychiatric Hospital Barriers to Discharge: No Barriers Identified   Patient Goals and CMS Choice      Discharge Placement                      Patient and family notified of of transfer: 03/22/23  Discharge Plan and Services Additional resources added to the After Visit Summary for                                       Social Determinants of Health (SDOH) Interventions SDOH Screenings   Food Insecurity: No Food Insecurity (03/19/2023)  Housing: Low Risk  (03/19/2023)  Transportation Needs: No Transportation Needs (03/19/2023)  Utilities: Not At Risk (03/19/2023)  Alcohol Screen: Low Risk  (05/05/2017)  Tobacco Use: High Risk (03/20/2023)     Readmission Risk Interventions     No data to display

## 2023-03-22 NOTE — Assessment & Plan Note (Signed)
-   Cont home zoloft 25mg  daily - Will need inpt psych (medically stable) - Psych consulted, appreciate recs - Suicide precautions. Sitter ordered.  - Will need IVC if attempts to leave

## 2023-03-22 NOTE — Consult Note (Signed)
[facility] Psychiatry Consult Evaluation  Service Date: March 22, 2023 LOS:  LOS: 3 days    Primary Psychiatric Diagnoses  Bipolar disorder, current episode depressed 2.  R/o Opioid use disorder 3. S/p overdose requiring narcan   Assessment  DORREEN VALIENTE is a 27 y.o. female admitted medically for 03/19/2023 11:23 AM for altered mental status. She carries the psychiatric diagnoses of ?schizophrenia and bipolar disorder, and has a past medical history of ezcema. Psychiatry was consulted for evaluation of altered mental status and concern for overdose.    Diagnoses:  Bipolar disorder, current episode is depressed GAD Suicide attempt via OD H/o SA via OD in 2018      Plan   ## Psychiatric Medication Recommendations:  -Hold psych meds for now in context of overdose    ## Medical Decision Making Capacity:  -- Not formally evaluated at this time  ## Further Work-up:  -elevated ammonina - trend   -- most recent EKG on 9/26 had QtC of 448   ## Disposition:  -- We recommend inpatient psychiatric hospitalization after medical hospitalization. Patient is under voluntary admission status at this time; please IVC if attempts to leave hospital.  ## Behavioral / Environmental:  --  Utilize compassion and acknowledge the patient's experiences while setting clear and realistic expectations for care.    ## Safety and Observation Level:  - Based on my clinical evaluation, I estimate the patient to be at high risk of self harm in the current setting  - At this time, we recommend an enhanced level of observation with a 1:1 sitter. This decision is based on my review of the chart including patient's history and current presentation, interview of the patient, mental status examination, and consideration of suicide risk including evaluating suicidal ideation, plan, intent, suicidal or self-harm behaviors, risk factors, and protective factors. This judgment is based on our ability to  directly address suicide risk, implement suicide prevention strategies and develop a safety plan while the patient is in the clinical setting. Please contact our team if there is a concern that risk level has changed.  Suicide risk assessment  Patient has following modifiable risk factors for suicide: under treated depression , social isolation, recklessness, and medication noncompliance, which we are addressing by planning to resume treatment of underlying psychiatry during .   Patient has following non-modifiable or demographic risk factors for suicide: history of suicide attempt, history of self harm behavior, and psychiatric hospitalization  Patient has the following protective factors against suicide: Access to outpatient mental health care and Supportive family   Pt recommended for inpt psychiatric hospitalization at this time, will sign-off.    Cristy Hilts, MD Psychiatrist   Psychiatric and Social History   Relevant Aspects of Hospital Course:  Admitted on 03/19/2023 for altered mental status. She is alert and communicative with depressed affect.   On examination today, the pt reports that her mood is less depressed. She is tearful g the interview. She reports elevated anxiety. She states that she did not overdose and only took her tylenol, advil, and psych meds, but cannot explain why she was found unresponsive for 20 minutes, in respiratory distress, requiring narcan.  Reports 1x h/o suicide attempt via overdose in 2018.  Reports hypersomnia for 1-2 weeks, not eating as much bc she sleeps all the time.  Concentration is poor. Denies si, including passive suicidal thoughts. Pt seems to be minimizng symptoms to avoid psychaitric hospitalization.  Reports she was [place] gher psych meds  regularly PTA but does not know what they are.  Reports last manic episode was 2 weeks ago, lasting 4-5 days.  Denies any current psychotic symptoms.     Collateral information:  Spoke with  mother, father, and sister in person today. Mother states "I know for a fact she was trying to kill herself. Whenever she gets off her medications, she starts to spiral". Mother reports that when patient first regained consciousness, she stated that she had taken "a lot of fentanyl".  Patient's family is concerned for her safety if she were to leave the hospital now.   Psychiatric History:  Information collected from patient, mother, and chart review.  Prev Dx/Sx: bipolar disorder, ?schizophrenia  Current Psych Provider: [facility] Meds (current): none Previous Med Trials: N/A Therapy: none  Prior ECT: no Prior Psych Hospitalization: 05/2016 at [facility] for SA by Haldol OD  Prior Self Harm: 05/2016 SA by Haldol OD  Prior Violence: no  Family Psych History: mother bipolar disorder, father bipolar disorder Family Hx suicide: no  Social History:  Developmental Hx: N/A Educational Hx: N/A Occupational Hx: N/A Legal Hx: 2 times in prison, no longer on parole or with any outstanding legal issues Living Situation: lives with a friend Spiritual Hx: N/A Access to weapons: unknown  Substance History Tobacco use: 0.5 PPD per chart Alcohol use: no Drug use: presumed fentanyl overdose 03/19/2023   Exam Findings   Psychiatric Specialty Exam:  Presentation  General Appearance: Casual  Eye Contact:Poor  Speech:Slow  Speech Volume:Decreased  Handedness:Right   Mood and Affect  Mood:Anxious  Affect:Depressed; Tearful   Thought Process  Thought Processes:Linear  Descriptions of Associations:Intact  Orientation:Full (Time, Place and Person)  Thought Content:Logical  Hallucinations:Hallucinations: None  Ideas of Reference:None  Suicidal Thoughts:Suicidal Thoughts: No  Homicidal Thoughts:Homicidal Thoughts: No   Sensorium  Memory:Immediate Fair; Recent Poor; Remote Fair  Judgment:Impaired  Insight:Lacking   Executive Functions   Concentration:Poor  Attention Span:Poor  Recall:Fair  Fund of Knowledge:Good  Language:Good   Psychomotor Activity  Psychomotor Activity:Psychomotor Activity: Normal   Assets  Assets:Financial Resources/Insurance; Housing   Sleep  Sleep:Sleep: -- (hypersomnia)    Physical Exam: Vital signs:  Temp:  [98.4 F (36.9 C)-98.8 F (37.1 C)] 98.8 F (37.1 C) (09/28 2315) Pulse Rate:  [64-66] 64 (09/28 2315) Resp:  [14-18] 16 (09/28 2315) BP: (105-107)/(55-78) 106/55 (09/28 2315) SpO2:  [98 %-100 %] 98 % (09/28 2315) Physical Exam Vitals reviewed.  Constitutional:      Appearance: Normal appearance. She is normal weight.  HENT:     Head: Normocephalic and atraumatic.     Nose: Nose normal.     Mouth/Throat:     Mouth: Mucous membranes are moist.     Pharynx: Oropharynx is clear.  Eyes:     Conjunctiva/sclera: Conjunctivae normal.     Pupils: Pupils are equal, round, and reactive to light.  Cardiovascular:     Rate and Rhythm: Normal rate.  Pulmonary:     Effort: Pulmonary effort is normal.  Abdominal:     General: Abdomen is flat.     Palpations: Abdomen is soft.  Musculoskeletal:        General: Normal range of motion.     Cervical back: Normal range of motion and neck supple.  Skin:    General: Skin is warm and dry.  Neurological:     General: No focal deficit present.     Mental Status: She is alert and oriented to person, place, and time.  Blood pressure (!) 106/55, pulse 64, temperature 98.8 F (37.1 C), temperature source Oral, resp. rate 16, last menstrual period 02/16/2023, SpO2 98%. There is no height or weight on file to calculate BMI.   Other History   These have been pulled in through the EMR, reviewed, and updated if appropriate.   Family History:  The patient's family history includes Asthma in her father, mother, and paternal grandmother; Bipolar disorder in her father and mother; Diabetes in her paternal grandmother; Hypertension in  her father and paternal grandmother.  Medical History: Past Medical History:  Diagnosis Date   Allergy    Anxiety    Asthma    Depression    Eczema    Ovarian cyst    resolved    Surgical History: Past Surgical History:  Procedure Laterality Date   OVARIAN CYST REMOVAL      Medications:   Current Facility-Administered Medications:    cholecalciferol (VITAMIN D3) 25 MCG (1000 UNIT) tablet 1,000 Units, 1,000 Units, Oral, Daily, Lincoln Brigham, MD, 1,000 Units at 03/22/23 1026   enoxaparin (LOVENOX) injection 40 mg, 40 mg, Subcutaneous, Q24H, Everhart, Kirstie, DO, 40 mg at 03/21/23 1800  Allergies: Allergies  Allergen Reactions   Dairy Aid [Tilactase] Hives        Peanut (Diagnostic) Other (See Comments)    Unknown reaction   Penicillins Rash    Review of Systems  Constitutional:  Negative for chills and fever.  Eyes:  Negative for double vision.  Cardiovascular:  Negative for chest pain and palpitations.  Neurological:  Negative for dizziness, tingling, tremors and headaches.  Psychiatric/Behavioral:  Positive for depression and suicidal ideas. The patient is nervous/anxious.   All other systems reviewed and are negative.

## 2023-03-22 NOTE — Assessment & Plan Note (Addendum)
Resolved. Cr improved s/p IVF, now tolerating PO intake.

## 2023-03-22 NOTE — Progress Notes (Signed)
     Daily Progress Note Intern Pager: [phone]  Patient name: Deanna [name] Medical record number: [id number] Date of birth: 1996/06/06 Age: 27 y.o. Gender: female  Primary Care Provider: Pcp, No Consultants: Psychiatry Code Status: Full  Pt Overview and Major Events to Date:  9/26 Admitted   Assessment and Plan:  Deanna [name] is 27 y.o. admitted for AMS in the setting of potential SI attempt by ingestion.  She is now medically stable for discharge to inpatient psychiatric facility.  Pertinent PMH/PSH includes bipolar disorder.  Assessment & Plan Altered mental status Improved. Sleepy, but easily awakens to voice, A&Ox3, and follows commands appropriately. Suspect pt is close to neurologic baseline. Will need inpatient psych once medically stable. RPR negative. -psych consult, appreciate recs -Suicide precautions -Restrict visitors, Please do NOT allow "Jennifer/Jen" to visit pt. -Fall precautions Acute kidney injury (HCC) Resolved. Cr improved s/p IVF, now tolerating PO intake. Suicidal behavior with attempted self-injury (HCC) - Cont home zoloft 25mg  daily - Will need inpt psych (medically stable) - Psych consulted, appreciate recs - Suicide precautions. Sitter ordered.  - Will need IVC if attempts to leave Vitamin D deficiency VitD low to 16.72.  - Start cholecalciferol 1000mg  daily    FEN/GI: Regular PPx: Lovenox Dispo: Inpatient psych. Medically stable for discharge.  Subjective:  NAEO. Patient without complaint this morning. Wishes to continue sleeping.  Objective: Temp:  [98.4 F (36.9 C)-98.8 F (37.1 C)] 98.8 F (37.1 C) (09/28 2315) Pulse Rate:  [63-66] 64 (09/28 2315) Resp:  [14-18] 16 (09/28 2315) BP: (89-107)/(55-78) 106/55 (09/28 2315) SpO2:  [94 %-100 %] 98 % (09/28 2315) Physical Exam: General: NAD Neuro: A&O, easily arousable Cardiovascular: RRR, no murmurs, no peripheral edema Respiratory: normal WOB on RA, CTAB, no wheezes, ronchi  or rales Abdomen: soft, NTTP, no rebound or guarding Extremities: Moving all 4 extremities equally   Laboratory: Most recent CBC Lab Results  Component Value Date   WBC 13.1 (H) 03/19/2023   HGB 12.6 03/19/2023   HCT 37.0 03/19/2023   MCV 92.2 03/19/2023   PLT 293 03/19/2023   Most recent BMP    Latest Ref Rng & Units 03/21/2023   10:31 AM  BMP  Glucose 70 - 99 mg/dL 81   BUN 6 - 20 mg/dL 6   Creatinine 6.57 - 8.46 mg/dL 9.62   Sodium 952 - 841 mmol/L 138   Potassium 3.5 - 5.1 mmol/L 3.3   Chloride 98 - 111 mmol/L 106   CO2 22 - 32 mmol/L 27   Calcium 8.9 - 10.3 mg/dL 8.5     Other pertinent labs: none   Imaging/Diagnostic Tests: No new imaging.  Celine Mans, MD 03/22/2023, 2:36 AM  PGY-2, [facility] FPTS Intern pager: [phone], text pages welcome Secure chat group [facility] Teaching Service

## 2023-03-23 ENCOUNTER — Encounter (HOSPITAL_COMMUNITY): Payer: Self-pay

## 2023-03-23 DIAGNOSIS — F314 Bipolar disorder, current episode depressed, severe, without psychotic features: Secondary | ICD-10-CM

## 2023-03-23 MED ORDER — VITAMIN D3 25 MCG PO TABS
1000.0000 [IU] | ORAL_TABLET | Freq: Every day | ORAL | Status: DC
  Administered 2023-03-23 – 2023-03-31 (×9): 1000 [IU] via ORAL
  Filled 2023-03-23 (×12): qty 1

## 2023-03-23 MED ORDER — POTASSIUM CHLORIDE CRYS ER 20 MEQ PO TBCR
40.0000 meq | EXTENDED_RELEASE_TABLET | Freq: Once | ORAL | Status: AC
  Administered 2023-03-23: 40 meq via ORAL
  Filled 2023-03-23 (×2): qty 2

## 2023-03-23 MED ORDER — ARIPIPRAZOLE 5 MG PO TABS
5.0000 mg | ORAL_TABLET | Freq: Every day | ORAL | Status: DC
  Administered 2023-03-23 – 2023-03-25 (×3): 5 mg via ORAL
  Filled 2023-03-23 (×5): qty 1

## 2023-03-23 MED ORDER — HYDROXYZINE HCL 25 MG PO TABS
25.0000 mg | ORAL_TABLET | Freq: Three times a day (TID) | ORAL | Status: DC | PRN
  Administered 2023-03-24 – 2023-03-26 (×3): 25 mg via ORAL
  Filled 2023-03-23 (×4): qty 1

## 2023-03-23 MED ORDER — TRAZODONE HCL 50 MG PO TABS
50.0000 mg | ORAL_TABLET | Freq: Every evening | ORAL | Status: DC | PRN
  Administered 2023-03-24 – 2023-03-30 (×4): 50 mg via ORAL
  Filled 2023-03-23 (×5): qty 1

## 2023-03-23 NOTE — Progress Notes (Signed)
   03/23/23 0531  15 Minute Checks  Location Bedroom  Visual Appearance Calm  Behavior Sleeping  Sleep (Behavioral Health Patients Only)  Calculate sleep? (Click Yes once per 24 hr at 0600 safety check) Yes  Documented sleep last 24 hours 6.25

## 2023-03-23 NOTE — Group Note (Signed)
Date:  03/23/2023 Time:  8:57 AM  Group Topic/Focus:  Goals Group:   The focus of this group is to help patients establish daily goals to achieve during treatment and discuss how the patient can incorporate goal setting into their daily lives to aide in recovery.    Participation Level:  Did Not Attend   Additional Comments:  Group was announced but pt chose not to attend.  Donell Beers 03/23/2023, 8:57 AM

## 2023-03-23 NOTE — Progress Notes (Addendum)
Patient presents with flat affect remaining mostly in room and not attending group. Patient has been med compliant and denies SI,HI, and A/V/H. Patient appears guarded and denies any pain or discomfort.     03/23/23 0830  Psych Admission Type (Psych Patients Only)  Admission Status Voluntary  Psychosocial Assessment  Patient Complaints None  Eye Contact Fair  Facial Expression Flat  Affect Flat  Speech Logical/coherent  Interaction Assertive  Motor Activity Slow  Appearance/Hygiene Disheveled  Behavior Characteristics Cooperative  Mood Depressed  Thought Process  Coherency WDL  Content WDL  Delusions None reported or observed  Perception WDL  Hallucination None reported or observed  Judgment Limited  Confusion None  Danger to Self  Current suicidal ideation? Denies  Agreement Not to Harm Self Yes  Description of Agreement verbal  Danger to Others  Danger to Others None reported or observed

## 2023-03-23 NOTE — Progress Notes (Signed)
   03/22/23 2200  Psych Admission Type (Psych Patients Only)  Admission Status Voluntary  Psychosocial Assessment  Patient Complaints None  Eye Contact Brief  Facial Expression Flat  Affect Preoccupied  Speech Soft  Interaction Guarded  Motor Activity Slow  Appearance/Hygiene Unremarkable  Behavior Characteristics Guarded  Mood Depressed  Thought Process  Coherency WDL  Content WDL  Delusions None reported or observed  Perception WDL  Hallucination None reported or observed  Judgment Impaired  Confusion None  Danger to Self  Current suicidal ideation? Denies  Danger to Others  Danger to Others None reported or observed

## 2023-03-23 NOTE — Plan of Care (Signed)
  Problem: Health Behavior/Discharge Planning: Goal: Compliance with treatment plan for underlying cause of condition will improve Outcome: Progressing   Problem: Safety: Goal: Periods of time without injury will increase Outcome: Progressing   

## 2023-03-23 NOTE — [facility] Suicide Risk Assessment (Signed)
Suicide Risk Assessment  Admission Assessment    [name] Admission Suicide Risk Assessment   Nursing information obtained from:    Demographic factors:  Gay, lesbian, or bisexual orientation Current Mental Status:  NA Loss Factors:  Loss of significant relationship Historical Factors:  Prior suicide attempts Risk Reduction Factors:  Positive social support  Total Time spent with patient: 30 minutes Principal Problem: Bipolar disorder (HCC) Diagnosis:  Principal Problem:   Bipolar disorder (HCC)  Subjective Data:  Deanna [name]. Deanna [name] is a 27 year old AA female with prior psychiatric history significant for bipolar disorder depressed type, suicide attempts with OD on medication, GAD who presents voluntarily to [facility] from [name] at [name] for overdose with unresponsiveness with unknown substance in the context of break-up with her significant other. After medical evaluation/stabilization & clearance, she was transferred to the [name] for further psychiatric evaluation & treatments.    Continued Clinical Symptoms:  Alcohol Use Disorder Identification Test Final Score (AUDIT): 0 The "Alcohol Use Disorders Identification Test", Guidelines for Use in Primary Care, Second Edition.  World Science writer [name]). Score between 0-7:  no or low risk or alcohol related problems. Score between 8-15:  moderate risk of alcohol related problems. Score between 16-19:  high risk of alcohol related problems. Score 20 or above:  warrants further diagnostic evaluation for alcohol dependence and treatment.  CLINICAL FACTORS:   Depression:   Anhedonia Hopelessness Impulsivity Severe Alcohol/Substance Abuse/Dependencies More than one psychiatric diagnosis Unstable or Poor Therapeutic Relationship Previous Psychiatric Diagnoses and Treatments  Musculoskeletal: Strength & Muscle Tone:  Gait & Station:  Patient leans:   Psychiatric Specialty Exam:  Presentation  General Appearance:   Casual  Eye Contact: Poor  Speech: Slow  Speech Volume: Decreased  Handedness: Right  Mood and Affect  Mood: Anxious  Affect: Depressed; Tearful  Thought Process  Thought Processes: Linear  Descriptions of Associations:Intact  Orientation:Full (Time, Place and Person)  Thought Content:Logical  History of Schizophrenia/Schizoaffective disorder:No data recorded Duration of Psychotic Symptoms:No data recorded Hallucinations:Hallucinations: None  Ideas of Reference:None  Suicidal Thoughts:Suicidal Thoughts: No  Homicidal Thoughts:Homicidal Thoughts: No  Sensorium  Memory: Immediate Fair; Recent Poor; Remote Fair  Judgment: Impaired  Insight: Lacking  Executive Functions  Concentration: Poor  Attention Span: Poor  Recall: Fair  Fund of Knowledge: Good  Language: Good  Psychomotor Activity  Psychomotor Activity: Psychomotor Activity: Normal  Assets  Assets: Financial Resources/Insurance; Housing  Sleep  Sleep: Sleep: -- (hypersomnia)  Physical Exam: Physical Exam Vitals and nursing note reviewed.  HENT:     Head: Normocephalic.     Nose: Nose normal.     Mouth/Throat:     Mouth: Mucous membranes are moist.  Eyes:     Extraocular Movements: Extraocular movements intact.  Cardiovascular:     Rate and Rhythm: Tachycardia present.  Pulmonary:     Effort: Pulmonary effort is normal.  Abdominal:     Comments: Deferred   Genitourinary:    Comments: Deferred  Musculoskeletal:        General: Normal range of motion.     Cervical back: Normal range of motion.  Skin:    General: Skin is warm.  Neurological:     General: No focal deficit present.     Mental Status: She is alert and oriented to person, place, and time.  Psychiatric:        Behavior: Behavior normal.    Review of Systems  Constitutional:  Negative for chills and fever.  HENT:  Negative for sore throat.   Eyes:  Negative for blurred vision.  Respiratory:   Negative for cough, shortness of breath and wheezing.   Cardiovascular:  Negative for chest pain and palpitations.  Gastrointestinal:  Negative for abdominal pain, heartburn, nausea and vomiting.  Genitourinary:  Negative for dysuria, frequency and urgency.  Musculoskeletal:  Negative for back pain, joint pain, myalgias and neck pain.  Skin:  Negative for itching and rash.  Neurological:  Negative for dizziness, tingling, tremors, sensory change and headaches.  Endo/Heme/Allergies:        See allergy listing  Psychiatric/Behavioral:  Positive for depression. The patient is nervous/anxious and has insomnia.    Blood pressure 105/83, pulse (!) 116, temperature 99.1 F (37.3 C), temperature source Oral, resp. rate 16, height 5\' 3"  (1.6 m), weight 74.5 kg, last menstrual period 02/16/2023, SpO2 98%. Body mass index is 29.09 kg/m.  COGNITIVE FEATURES THAT CONTRIBUTE TO RISK:  Polarized thinking    SUICIDE RISK:   Severe:  Frequent, intense, and enduring suicidal ideation, specific plan, no subjective intent, but some objective markers of intent (i.e., choice of lethal method), the method is accessible, some limited preparatory behavior, evidence of impaired self-control, severe dysphoria/symptomatology, multiple risk factors present, and few if any protective factors, particularly a lack of social support.  PLAN OF CARE: Assessment: Bipolar disorder recurrent episode depressed Suicide attempt via OD GAD H/o SA via OD in 2018  Plans: Medications: Initiate Abilify 5 mg p.o. daily for mood stabilization Continue hydroxyzine tablets 25 mg p.o. 3 times daily as needed for anxiety Continue trazodone tablets 50 mg p.o. at bedtime as needed for insomnia Resume home meds colecalciferol 25 mg 1 tablet 1000 unit total p.o. daily for vitamin D deficiency Initiates potassium chloride 40 mEq p.o. x 1 dose only for K+ 3.3  Agitation protocol: Risperdal M-tabs disintegrating tablets 2 mg p.o. every 8  hours as needed agitation and Geodon injection 20 mg IM as needed x 1 dose and Lorazepam tablet 1 mg p.o. as needed agitation x 1 dose only  Other PRN Medications -Acetaminophen 650 mg every 6 as needed/mild pain -Maalox 30 mL oral every 4 as needed/digestion -Magnesium hydroxide 30 mL daily as needed/mild constipation  -- The risks/benefits/side-effects/alternatives to this medication were discussed in detail with the patient and time was given for questions. The patient consents to medication trial.  -- Metabolic profile and EKG monitoring obtained while on an atypical antipsychotic (BMI: Lipid Panel: HbgA1c: QTc:)  -- Encouraged patient to participate in unit milieu and in scheduled group therapies   Abnormal Labs reviewed: CMP: K+ 3.3 replace with potassium chloride 40 mEq x 1 dose only, Ca +8.5, creatinine 1.02, vitamin D 25-hydroxy 16.72.  BAL less than 10.  UDS: None dictated  Labs ordered: Hemoglobin A1c, lipid panel, TSH.  EKG reviewed: NSR with sinus arrhythmia, ventricular rate 62, QT/QTc 432/438   Safety and Monitoring: Voluntary admission to inpatient psychiatric unit for safety, stabilization and treatment Daily contact with patient to assess and evaluate symptoms and progress in treatment Patient's case to be discussed in multi-disciplinary team meeting Observation Level : q15 minute checks Vital signs: q12 hours Precautions: suicide, but pt currently verbally contracts for safety on unit    Discharge Planning: Social work and case management to assist with discharge planning and identification of [name] follow-up needs prior to discharge Estimated LOS: 5-7 days Discharge Concerns: Need to establish a safety plan; Medication compliance and effectiveness Discharge Goals: Return home with outpatient referrals for  mental health follow-up including medication management/psychotherapy.  Physician Treatment Plan for Primary Diagnosis: Bipolar disorder (HCC) Long Term  Goal(s): Improvement in symptoms so as ready for discharge  Short Term Goals: Ability to identify changes in lifestyle to reduce recurrence of condition will improve, Ability to verbalize feelings will improve, Ability to disclose and discuss suicidal ideas, Ability to demonstrate self-control will improve, Ability to identify and develop effective coping behaviors will improve, Ability to maintain clinical measurements within normal limits will improve, Compliance with prescribed medications will improve, and Ability to identify triggers associated with substance abuse/mental health issues will improve  Physician Treatment Plan for Secondary Diagnosis:   Principal Problem:   Bipolar disorder (HCC)  I certify that inpatient services furnished can reasonably be expected to improve the patient's condition.   Cecilie Lowers, FNP 03/23/2023, 8:27 AM

## 2023-03-23 NOTE — Group Note (Signed)
Recreation Therapy Group Note   Group Topic:Stress Management  Group Date: 03/23/2023 Start Time: 2952 End Time: 0956 Facilitators: Deanna [name], LRT,CTRS Location: 300 Hall Dayroom   Group Topic: Stress Management  Goal Area(s) Addresses:  Patient will identify positive stress management techniques. Patient will identify benefits of using stress management post d/c.  Group Description: Meditation. LRT played a meditation from the Calm app that focused on taking in the characteristics of a mountain. It encouraged participates to envision how the mountain stands tall and endures whatever it's confronted with (ie. Changing weather, different seasons, time of day) and encouraged patients to take on that same attitude when they are faced with the challenges of life.   Education:  Stress Management, Discharge Planning.   Education Outcome: Acknowledges Education   Affect/Mood: N/A   Participation Level: Did not attend    Clinical Observations/Individualized Feedback:     Plan: Continue to engage patient in RT group sessions 2-3x/week.   Deanna [name], LRT,CTRS 03/23/2023 11:28 AM

## 2023-03-23 NOTE — [facility] Group Notes (Signed)

## 2023-03-23 NOTE — BH IP Treatment Plan (Signed)
Interdisciplinary Treatment and Diagnostic Plan Update  03/23/2023 Time of Session: 11:05am Deanna [name] MRN: [id number]  Principal Diagnosis: Bipolar disorder [facility])  Secondary Diagnoses: Principal Problem:   Bipolar disorder (HCC)   Current Medications:  Current Facility-Administered Medications  Medication Dose Route Frequency Provider Last Rate Last Admin   acetaminophen (TYLENOL) tablet 650 mg  650 mg Oral Q6H PRN Ntuen, Jesusita Oka, FNP       alum & mag hydroxide-simeth (MAALOX/MYLANTA) 200-200-20 MG/5ML suspension 30 mL  30 mL Oral Q4H PRN Ntuen, Jesusita Oka, FNP       ARIPiprazole (ABILIFY) tablet 5 mg  5 mg Oral Daily Ntuen, Jesusita Oka, FNP   5 mg at 03/23/23 1317   hydrOXYzine (ATARAX) tablet 25 mg  25 mg Oral TID PRN Massengill, Harrold Donath, MD       risperiDONE (RISPERDAL M-TABS) disintegrating tablet 2 mg  2 mg Oral Q8H PRN Ntuen, Jesusita Oka, FNP       And   LORazepam (ATIVAN) tablet 1 mg  1 mg Oral PRN Ntuen, Jesusita Oka, FNP       And   ziprasidone (GEODON) injection 20 mg  20 mg Intramuscular PRN Ntuen, Jesusita Oka, FNP       magnesium hydroxide (MILK OF MAGNESIA) suspension 30 mL  30 mL Oral Daily PRN Ntuen, Jesusita Oka, FNP       traZODone (DESYREL) tablet 50 mg  50 mg Oral QHS PRN Massengill, Harrold Donath, MD       PTA Medications: Medications Prior to Admission  Medication Sig Dispense Refill Last Dose   cholecalciferol (CHOLECALCIFEROL) 25 MCG tablet Take 1 tablet (1,000 Units total) by mouth daily.       Patient Stressors:    Patient Strengths:    Treatment Modalities: Medication Management, Group therapy, Case management,  1 to 1 session with clinician, Psychoeducation, Recreational therapy.   Physician Treatment Plan for Primary Diagnosis: Bipolar disorder (HCC) Long Term Goal(s): Improvement in symptoms so as ready for discharge   Short Term Goals: Ability to identify changes in lifestyle to reduce recurrence of condition will improve Ability to verbalize feelings will improve Ability to  disclose and discuss suicidal ideas Ability to demonstrate self-control will improve Ability to identify and develop effective coping behaviors will improve Ability to maintain clinical measurements within normal limits will improve Compliance with prescribed medications will improve Ability to identify triggers associated with substance abuse/mental health issues will improve  Medication Management: Evaluate patient's response, side effects, and tolerance of medication regimen.  Therapeutic Interventions: 1 to 1 sessions, Unit Group sessions and Medication administration.  Evaluation of Outcomes: Progressing  Physician Treatment Plan for Secondary Diagnosis: Principal Problem:   Bipolar disorder (HCC)  Long Term Goal(s): Improvement in symptoms so as ready for discharge   Short Term Goals: Ability to identify changes in lifestyle to reduce recurrence of condition will improve Ability to verbalize feelings will improve Ability to disclose and discuss suicidal ideas Ability to demonstrate self-control will improve Ability to identify and develop effective coping behaviors will improve Ability to maintain clinical measurements within normal limits will improve Compliance with prescribed medications will improve Ability to identify triggers associated with substance abuse/mental health issues will improve     Medication Management: Evaluate patient's response, side effects, and tolerance of medication regimen.  Therapeutic Interventions: 1 to 1 sessions, Unit Group sessions and Medication administration.  Evaluation of Outcomes: Progressing   RN Treatment Plan for Primary Diagnosis: Bipolar disorder (HCC) Long Term Goal(s): Knowledge of disease  and therapeutic regimen to maintain health will improve  Short Term Goals: Ability to remain free from injury will improve, Ability to verbalize frustration and anger appropriately will improve, Ability to participate in decision making will  improve, Ability to verbalize feelings will improve, Ability to identify and develop effective coping behaviors will improve, and Compliance with prescribed medications will improve  Medication Management: RN will administer medications as ordered by provider, will assess and evaluate patient's response and provide education to patient for prescribed medication. RN will report any adverse and/or side effects to prescribing provider.  Therapeutic Interventions: 1 on 1 counseling sessions, Psychoeducation, Medication administration, Evaluate responses to treatment, Monitor vital signs and CBGs as ordered, Perform/monitor CIWA, COWS, AIMS and Fall Risk screenings as ordered, Perform wound care treatments as ordered.  Evaluation of Outcomes: Progressing   LCSW Treatment Plan for Primary Diagnosis: Bipolar disorder (HCC) Long Term Goal(s): Safe transition to appropriate next level of care at discharge, Engage patient in therapeutic group addressing interpersonal concerns.  Short Term Goals: Engage patient in aftercare planning with referrals and resources, Increase social support, Increase emotional regulation, Facilitate acceptance of mental health diagnosis and concerns, Identify triggers associated with mental health/substance abuse issues, and Increase skills for wellness and recovery  Therapeutic Interventions: Assess for all discharge needs, 1 to 1 time with Social worker, Explore available resources and support systems, Assess for adequacy in community support network, Educate family and significant other(s) on suicide prevention, Complete Psychosocial Assessment, Interpersonal group therapy.  Evaluation of Outcomes: Progressing   Progress in Treatment: Attending groups: No. Participating in groups: No. Taking medication as prescribed: Yes. Toleration medication: Yes. Family/Significant other contact made: No, will contact:  pending consent  Patient understands diagnosis: Yes. Discussing  patient identified problems/goals with staff: Yes. Medical problems stabilized or resolved: Yes. Denies suicidal/homicidal ideation: Yes. Issues/concerns per patient self-inventory: No.  New problem(s) identified: No, Describe:  none reported  New Short Term/Long Term Goal(s):medication stabilization, elimination of SI thoughts, development of comprehensive mental wellness plan.    Patient Goals:  "I don't know"  Discharge Plan or Barriers: Patient recently admitted. CSW will continue to follow and assess for appropriate referrals and possible discharge planning.    Reason for Continuation of Hospitalization: Depression Medication stabilization Suicidal ideation  Estimated Length of Stay:5-7 days  Last 3 [place] Suicide Severity Risk Score: Flowsheet Row [facility]Current[facility] (Discharged) from 03/19/2023 in [facility] from 03/16/2023 in [facility]  C-SSRS RISK CATEGORY No Risk No Risk No Risk       Last PHQ 2/9 Scores:     No data to display          Scribe for Treatment Team: Izell Salome, LCSW 03/23/2023 2:20 PM

## 2023-03-23 NOTE — Group Note (Signed)
Date:  03/23/2023 Time:  11:31 PM  Group Topic/Focus:  Recovery Goals:   The focus of this group is to identify appropriate goals for recovery and establish a plan to achieve them.    Participation Level:  Active  Participation Quality:  Appropriate  Affect:  Appropriate  Cognitive:  Appropriate  Insight: Appropriate  Engagement in Group:  Engaged  Modes of Intervention:  Socialization  Additional Comments:    Kelechi Orgeron 03/23/2023, 11:31 PM

## 2023-03-23 NOTE — H&P (Signed)
Psychiatric Admission Assessment Adult  Patient Identification: Deanna [name] MRN:  [id number] Date of Evaluation:  03/23/2023 Chief Complaint:  Bipolar disorder [facility]) [F31.9] Principal Diagnosis: Bipolar disorder (HCC) Diagnosis:  Principal Problem:   Bipolar disorder (HCC)   Suicide attempt via OD   GAD   H/o SA via OD in 2018  CC: "I think someone gave me some kind of pills that made me overdose in my car. "  History of Present Illness: Deanna [name] is a 27 year old AA female with prior psychiatric history significant for bipolar disorder depressed type, suicide attempts with OD on medication, GAD who presents voluntarily to [facility] from [facility] health ED at [facility] for overdose with unresponsiveness with unknown substance in the context of break-up with her significant other. After medical evaluation/stabilization & clearance, she was transferred to the [facility] for further psychiatric evaluation & treatments.   During this evaluation, patient reports that she does not remember the events surrounding her admission.  However, she thinks someone gave her some type of pills that made her to overdose in her car.  Patient will not provide any other information.  Further added that she remembers being admitted to [facility] in 2018 for suicidal attempt when she overdosed on Haldol and was treated with Remeron and discharged.  Report receiving outpatient therapy at [facility] psychiatry in [place].  She reports being managed by Lamictal for her mood however, does not remember the dosage.  Reports last use of Remeron was in 2019.  She denies history of sexual, physical, or emotional abuse, drug use, alcohol use, or marijuana use.  Report being on trial medications of Remeron, hydroxyzine, and Lamictal.  Patient gave this provider consent to speak with the mother for more information.  Collateral information: Patient's mother Ms Asbury called at [phone] for more information.  Mother reported  that patient lives with her home-boy in his apartment after breaking up with her ex girlfriend.  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  She added that on Wednesday night, another friend took patient out and they were driving around, supposedly doing fentanyl.  Then patient wrote a suicide note to her ex girlfriend stating, "It is done.  You will be the first one to see my dead body." She added that patient fell asleep in the car and when her friend attempted to wake her up, she could not wake up.  So he frantically called the patient's ex girlfriend who shared with him the suicide note.  Then they called patient's mom who called 911 to pick up patient to the hospital.  As per report by EMS, "The patient reportedly was a GCS 3 with agonal respirations and pinpoint pupils. Was in that state for at least 20 minutes. Received Narcan on scene and reportedly had no improvement and then had subsequently woke up on the ride here. Not yet talking. Felt warm to the touch per EMS. Patient is not verbal for me on exam. Level 5 caveat altered mental status."   Evaluation: Patient was seen and examined sitting up in a chair in the office.  She is alert, calm, oriented, to person, time, place, and situation.  Speech clear, coherent, with normal volume and pattern.  Able to maintain good eye contact with this provider, however reports that she does not remember the circumstances surrounding her admission.  Requested to know when she will be discharged.  Thought content and thought process coherent and relevant.  Patient denies drug  use, alcohol use or tobacco smoking.  BAL less than 10, UDS negative for substances.  Obviously, patient is not responding to internal or external stimuli.  Denies delusional thinking, denies paranoia, denies symptoms of psychosis, PTSD, OCD, or mania.  Not sure at this time the extent of her brain involvement with the overdose with fentanyl.  Vital signs reviewed within  normal limits except pulse elevated at 116.  Labs and EKG reviewed as indicated in the treatment plan.  Patient is admitted for mood stabilization, safety, and medication management.  Mode of transport to Hospital: Safe transport Current Outpatient (Home) Medication List: See home medication listing PRN medication prior to evaluation: See home medication listing  ED course: Labs and EKG we have obtained and analyzed. Admitted, with IV fluids,  x-ray ordered and reviewed due to opioid OD of unknown amount. Collateral information: Obtained from Ms. Kamer at [phone] POA/Legal Guardian: Patient is own legal guardian  Past Psychiatric Hx: Previous Psych Diagnoses: Bipolar disorder, SI with attempted self injury. Prior inpatient treatment: Yes, in 2018 at the [facility] Current/prior outpatient treatment: Denies Prior rehab hx: Denies Psychotherapy hx: Yes History of suicide: Yes x 1 in 2018 History of homicide or aggression: Denies Psychiatric medication history: Yes, patient has prior history of Remeron, Lamictal, and hydroxyzine Psychiatric medication compliance history: Yes, patient reports compliance Neuromodulation history: Denies Current Psychiatrist: Denies current psychiatrist Current therapist: Denies current therapist  Substance Abuse Hx: Alcohol: Denies alcohol usage Tobacco: Denies smoking cigarettes Illicit drugs: Denies illicit drug use Rx drug abuse: Denies drug treatment Rehab hx: Denies rehabilitation for drugs or alcohol  Past Medical History: Medical Diagnoses: Past medical history of right ovarian cyst, atopic dermatitis, acute kidney injury, and vitamin D deficiency. Home Rx: Yes, colecalciferol 25 mcg tablets 1000 units p.o. daily Prior Hosp: Denies Prior Surgeries/Trauma: Denies Head trauma, LOC, concussions, seizures: Denies history of seizures Allergies:          Reaction Severity Reaction Type Noted         Allergies     Dairy Aid [Tilactase]  Hives  Medium Allergy 11/08/2011      Peanut (Diagnostic)  Other (See Comments) Not Specified Unspecified 01/29/2017  Unknown reaction    Penicillins  Rash Low Allergy 06/28/2011   LMP: September 2024 Contraception: Denies PCP: Denies  Family History: Medical: Denies Psych: Denies Psych Rx: Denies SA/HA: Denies Substance use family hx: Denies  Social History: Childhood (bring, raised, lives now, parents, siblings, schooling, education): Some college Abuse: Denies history of abuse Marital Status: Single Sexual orientation: Female from birth Children: No children Employment: Employed at [employer] Peer Group: Denies peer group Housing: Patient has own housing Finances: No financial difficulty Legal: Denies Special educational needs teacher: Denies serving in the [employer]  Associated Signs/Symptoms: Depression Symptoms: Patient denies depressive symptoms  (Hypo) Manic Symptoms:  Impulsivity,  Anxiety Symptoms:   Denies  Psychotic Symptoms: Denies  PTSD Symptoms: NA  Total Time spent with patient: 1 hour  Past Psychiatric History: Is the patient at risk to self? Yes.    Has the patient been a risk to self in the past 6 months? No.  Has the patient been a risk to self within the distant past? Yes.    Is the patient a risk to others? No.  Has the patient been a risk to others in the past 6 months? No.  Has the patient been a risk to others within the distant past? No.   [place] Scale:  [employer]  [facility]) [facility] Hosp-Admission (Discharged) from 03/19/2023 in [facility] from 03/16/2023 in [facility]  C-SSRS RISK CATEGORY No Risk No Risk No Risk      Alcohol Screening: 1. How often do you have a drink containing alcohol?: Never 2. How many drinks containing alcohol do you have on a typical day when you are drinking?: 1 or 2 3. How often do you  have six or more drinks on one occasion?: Never AUDIT-C Score: 0 4. How often during the last year have you found that you were not able to stop drinking once you had started?: Never 5. How often during the last year have you failed to do what was normally expected from you because of drinking?: Never 6. How often during the last year have you needed a first drink in the morning to get yourself going after a heavy drinking session?: Never 7. How often during the last year have you had a feeling of guilt of remorse after drinking?: Never 8. How often during the last year have you been unable to remember what happened the night before because you had been drinking?: Never 9. Have you or someone else been injured as a result of your drinking?: No 10. Has a relative or friend or a doctor or another health worker been concerned about your drinking or suggested you cut down?: No Alcohol Use Disorder Identification Test Final Score (AUDIT): 0 Alcohol Brief Interventions/Follow-up: Alcohol education/Brief advice  Substance Abuse History in the last 12 months:  No.  Consequences of Substance Abuse: Discussed with patient during this admission evaluation. Medical Consequences:  Liver damage, Possible death by overdose Legal Consequences:  Arrests, jail time, Loss of driving privilege. Family Consequences:  Family discord, divorce and or separation  Previous Psychotropic Medications: Yes  Psychological Evaluations: Yes  Past Medical History:  Past Medical History:  Diagnosis Date   Allergy    Anxiety    Asthma    Depression    Eczema    Ovarian cyst    resolved    Past Surgical History:  Procedure Laterality Date   OVARIAN CYST REMOVAL     Family History:  Family History  Problem Relation Age of Onset   Asthma Mother    Bipolar disorder Mother    Asthma Father    Hypertension Father    Bipolar disorder Father    Hypertension Paternal Grandmother    Diabetes Paternal Grandmother     Asthma Paternal Grandmother    Tobacco Screening:  Social History   Tobacco Use  Smoking Status Every Day   Current packs/day: 0.50   Types: Cigarettes  Smokeless Tobacco Never    BH Tobacco Counseling     Are you interested in Tobacco Cessation Medications?  No, patient refused Counseled patient on smoking cessation:  Refused/Declined practical counseling Reason Tobacco Screening Not Completed: No value filed.   Social History:  Social History   Substance and Sexual Activity  Alcohol Use No     Social History   Substance and Sexual Activity  Drug Use No    Additional Social History:    Allergies:   Allergies  Allergen Reactions   Dairy Aid [Tilactase] Hives        Peanut (Diagnostic) Other (See Comments)    Unknown reaction   Penicillins Rash   Lab Results:  No results found for this or any previous  visit (from the past 48 hour(s)).  Blood Alcohol level:  Lab Results  Component Value Date   ETH <10 03/19/2023   ETH <5 06/01/2016   Metabolic Disorder Labs:  No results found for: "HGBA1C", "MPG" No results found for: "PROLACTIN" No results found for: "CHOL", "TRIG", "HDL", "CHOLHDL", "VLDL", "LDLCALC"  Current Medications: Current Facility-Administered Medications  Medication Dose Route Frequency Provider Last Rate Last Admin   acetaminophen (TYLENOL) tablet 650 mg  650 mg Oral Q6H PRN Havah Ammon, Jesusita Oka, FNP       alum & mag hydroxide-simeth (MAALOX/MYLANTA) 200-200-20 MG/5ML suspension 30 mL  30 mL Oral Q4H PRN Tedra Coppernoll, Jesusita Oka, FNP       ARIPiprazole (ABILIFY) tablet 5 mg  5 mg Oral Daily Lakiesha Ralphs, Jesusita Oka, FNP   5 mg at 03/23/23 1317   hydrOXYzine (ATARAX) tablet 25 mg  25 mg Oral TID PRN Massengill, Harrold Donath, MD       risperiDONE (RISPERDAL M-TABS) disintegrating tablet 2 mg  2 mg Oral Q8H PRN Shelby Peltz, Jesusita Oka, FNP       And   LORazepam (ATIVAN) tablet 1 mg  1 mg Oral PRN Rajan Burgard, Jesusita Oka, FNP       And   ziprasidone (GEODON) injection 20 mg  20 mg Intramuscular PRN  Vanna Shavers, Jesusita Oka, FNP       magnesium hydroxide (MILK OF MAGNESIA) suspension 30 mL  30 mL Oral Daily PRN Jennette Leask, Jesusita Oka, FNP       potassium chloride SA (KLOR-CON M) CR tablet 40 mEq  40 mEq Oral Once Xzavior Reinig, Jesusita Oka, FNP       traZODone (DESYREL) tablet 50 mg  50 mg Oral QHS PRN Massengill, Nathan, MD       vitamin D3 (CHOLECALCIFEROL) tablet 1,000 Units  1,000 Units Oral Daily Symon Norwood, Jesusita Oka, FNP       PTA Medications: Medications Prior to Admission  Medication Sig Dispense Refill Last Dose   cholecalciferol (CHOLECALCIFEROL) 25 MCG tablet Take 1 tablet (1,000 Units total) by mouth daily.      Musculoskeletal: Strength & Muscle Tone: within normal limits Gait & Station: normal Patient leans: N/A Psychiatric Specialty Exam:  Presentation  General Appearance:  Appropriate for Environment; Casual; Fairly Groomed  Eye Contact: Fair  Speech: Clear and Coherent  Speech Volume: Normal  Handedness: Right  Mood and Affect  Mood: Anxious; Depressed  Affect: Congruent  Thought Process  Thought Processes: Linear  Duration of Psychotic Symptoms:N/A  Past Diagnosis of Schizophrenia or Psychoactive disorder: No data recorded  Descriptions of Associations:Intact  Orientation:Partial (Patient not forthcoming with all information)  Thought Content:Logical  Hallucinations:Hallucinations: None  Ideas of Reference:None  Suicidal Thoughts:Suicidal Thoughts: No  Homicidal Thoughts:Homicidal Thoughts: No  Sensorium  Memory: Immediate Fair; Recent Fair  Judgment: Poor  Insight: Lacking  Executive Functions  Concentration: Fair  Attention Span: Fair  Recall: [employer] of Knowledge: Fair  Language: Fair  Psychomotor Activity  Psychomotor Activity: Psychomotor Activity: Normal  Assets  Assets: Communication Skills; Desire for Improvement; Housing; Physical Health; Resilience; Social Support  Sleep  Sleep: Sleep: Good Number of Hours of Sleep:  7  Physical Exam: Physical Exam Vitals and nursing note reviewed.  HENT:     Head: Normocephalic.     Nose: Nose normal.     Mouth/Throat:     Mouth: Mucous membranes are moist.  Eyes:     Extraocular Movements: Extraocular movements intact.  Cardiovascular:     Rate and Rhythm: Tachycardia present.  Pulmonary:     Effort: Pulmonary effort is normal.  Abdominal:     Comments: Deferred   Genitourinary:    Comments: Deferred  Musculoskeletal:        General: Normal range of motion.     Cervical back: Normal range of motion.  Skin:    General: Skin is warm.  Neurological:     General: No focal deficit present.     Mental Status: She is alert and oriented to person, place, and time.  Psychiatric:        Mood and Affect: Mood normal.        Behavior: Behavior normal.   Review of Systems  Constitutional:  Negative for chills and fever.  HENT:  Negative for sore throat.   Eyes:  Negative for blurred vision.  Respiratory:  Negative for cough, shortness of breath and wheezing.   Cardiovascular:  Negative for chest pain and palpitations.  Gastrointestinal:  Negative for abdominal pain, heartburn, nausea and vomiting.  Genitourinary:  Negative for dysuria, frequency and urgency.  Musculoskeletal: Negative.  Negative for back pain, joint pain, myalgias and neck pain.  Skin:  Negative for itching and rash.  Neurological:  Negative for dizziness, tingling, tremors and headaches.  Endo/Heme/Allergies:        See Allergy listing  Psychiatric/Behavioral:  Positive for depression and substance abuse. The patient is nervous/anxious and has insomnia.    Blood pressure 113/74, pulse 63, temperature 99.1 F (37.3 C), temperature source Oral, resp. rate 16, height 5\' 3"  (1.6 m), weight 74.5 kg, last menstrual period 02/16/2023, SpO2 98%. Body mass index is 29.09 kg/m.  Treatment Plan Summary: Daily contact with patient to assess and evaluate symptoms and progress in treatment and  Medication management  Assessment: Bipolar disorder recurrent episode depressed Suicide attempt via OD GAD H/o SA via OD in 2018  Plans: Medications: Initiate Abilify 5 mg p.o. daily for mood stabilization Continue hydroxyzine tablets 25 mg p.o. 3 times daily as needed for anxiety Continue trazodone tablets 50 mg p.o. at bedtime as needed for insomnia Resume home meds colecalciferol 25 mg 1 tablet 1000 unit total p.o. daily for vitamin D deficiency Initiates potassium chloride 40 mEq p.o. x 1 dose only for K+ 3.3  Agitation protocol: Risperdal M-tabs disintegrating tablets 2 mg p.o. every 8 hours as needed agitation and Geodon injection 20 mg IM as needed x 1 dose and Lorazepam tablet 1 mg p.o. as needed agitation x 1 dose only  Other PRN Medications -Acetaminophen 650 mg every 6 as needed/mild pain -Maalox 30 mL oral every 4 as needed/digestion -Magnesium hydroxide 30 mL daily as needed/mild constipation  -- The risks/benefits/side-effects/alternatives to this medication were discussed in detail with the patient and time was given for questions. The patient consents to medication trial.  -- Metabolic profile and EKG monitoring obtained while on an atypical antipsychotic (BMI: Lipid Panel: HbgA1c: QTc:)  -- Encouraged patient to participate in unit milieu and in scheduled group therapies   Abnormal Labs reviewed: CMP: K+ 3.3 replace with potassium chloride 40 mEq x 1 dose only, Ca +8.5, creatinine 1.02, vitamin D 25-hydroxy 16.72.  BAL less than 10.  UDS: None dictated  Labs ordered: Hemoglobin A1c, lipid panel, TSH.  EKG reviewed: NSR with sinus arrhythmia, ventricular rate 62, QT/QTc 432/438   Safety and Monitoring: Voluntary admission to inpatient psychiatric unit for safety, stabilization and treatment Daily contact with patient to assess and evaluate symptoms and progress in treatment Patient's case to be discussed in  multi-disciplinary team meeting Observation Level :  q15 minute checks Vital signs: q12 hours Precautions: suicide, but pt currently verbally contracts for safety on unit    Discharge Planning: Social work and case management to assist with discharge planning and identification of hospital follow-up needs prior to discharge Estimated LOS: 5-7 days Discharge Concerns: Need to establish a safety plan; Medication compliance and effectiveness Discharge Goals: Return home with outpatient referrals for mental health follow-up including medication management/psychotherapy.  Physician Treatment Plan for Primary Diagnosis: Bipolar disorder (HCC) Long Term Goal(s): Improvement in symptoms so as ready for discharge  Short Term Goals: Ability to identify changes in lifestyle to reduce recurrence of condition will improve, Ability to verbalize feelings will improve, Ability to disclose and discuss suicidal ideas, Ability to demonstrate self-control will improve, Ability to identify and develop effective coping behaviors will improve, Ability to maintain clinical measurements within normal limits will improve, Compliance with prescribed medications will improve, and Ability to identify triggers associated with substance abuse/mental health issues will improve  Physician Treatment Plan for Secondary Diagnosis:   Principal Problem:   Bipolar disorder (HCC)  I certify that inpatient services furnished can reasonably be expected to improve the patient's condition.    Cecilie Lowers, FNP 9/30/20244:32 PM

## 2023-03-24 DIAGNOSIS — F314 Bipolar disorder, current episode depressed, severe, without psychotic features: Secondary | ICD-10-CM | POA: Diagnosis not present

## 2023-03-24 DIAGNOSIS — Z419 Encounter for procedure for purposes other than remedying health state, unspecified: Secondary | ICD-10-CM | POA: Diagnosis not present

## 2023-03-24 LAB — LIPID PANEL
Cholesterol: 188 mg/dL (ref 0–200)
HDL: 40 mg/dL — ABNORMAL LOW (ref >40)
LDL Cholesterol: 127 mg/dL — ABNORMAL HIGH (ref 0–99)
Total CHOL/HDL Ratio: 4.7 {ratio}
Triglycerides: 107 mg/dL (ref <150)
VLDL: 21 mg/dL (ref 0–40)

## 2023-03-24 LAB — BASIC METABOLIC PANEL
Anion gap: 12 (ref 5–15)
BUN: 13 mg/dL (ref 6–20)
CO2: 21 mmol/L — ABNORMAL LOW (ref 22–32)
Calcium: 9.5 mg/dL (ref 8.9–10.3)
Chloride: 100 mmol/L (ref 98–111)
Creatinine, Ser: 0.93 mg/dL (ref 0.44–1.00)
GFR, Estimated: >60 mL/min (ref >60)
Glucose, Bld: 118 mg/dL — ABNORMAL HIGH (ref 70–99)
Potassium: 3.8 mmol/L (ref 3.5–5.1)
Sodium: 133 mmol/L — ABNORMAL LOW (ref 135–145)

## 2023-03-24 LAB — HEMOGLOBIN A1C
Hgb A1c MFr Bld: 5.1 % (ref 4.8–5.6)
Mean Plasma Glucose: 99.67 mg/dL

## 2023-03-24 LAB — VITAMIN B1: Vitamin B1 (Thiamine): 114.5 nmol/L (ref 66.5–200.0)

## 2023-03-24 LAB — TSH: TSH: 0.775 u[IU]/mL (ref 0.350–4.500)

## 2023-03-24 MED ORDER — HALOPERIDOL LACTATE 5 MG/ML IJ SOLN: 5.0000 mg | Freq: Three times a day (TID) | INTRAMUSCULAR | Status: DC | PRN

## 2023-03-24 MED ORDER — LORAZEPAM 2 MG/ML IJ SOLN: 2.0000 mg | Freq: Three times a day (TID) | INTRAMUSCULAR | Status: DC | PRN

## 2023-03-24 MED ORDER — DIPHENHYDRAMINE HCL 25 MG PO CAPS: 50.0000 mg | ORAL_CAPSULE | Freq: Three times a day (TID) | ORAL | Status: DC | PRN

## 2023-03-24 MED ORDER — LORAZEPAM 1 MG PO TABS: 2.0000 mg | ORAL_TABLET | Freq: Three times a day (TID) | ORAL | Status: DC | PRN

## 2023-03-24 MED ORDER — HALOPERIDOL 5 MG PO TABS: 5.0000 mg | ORAL_TABLET | Freq: Three times a day (TID) | ORAL | Status: DC | PRN

## 2023-03-24 MED ORDER — DIPHENHYDRAMINE HCL 50 MG/ML IJ SOLN: 50.0000 mg | Freq: Three times a day (TID) | INTRAMUSCULAR | Status: DC | PRN

## 2023-03-24 NOTE — Progress Notes (Signed)
   03/24/23 2233  Psych Admission Type (Psych Patients Only)  Admission Status Voluntary  Psychosocial Assessment  Patient Complaints None  Eye Contact Fair  Facial Expression Flat  Affect Flat  Speech Logical/coherent  Interaction Isolative;Minimal  Motor Activity Slow  Appearance/Hygiene Disheveled  Behavior Characteristics Cooperative;Guarded  Mood Depressed  Thought Process  Coherency WDL  Content WDL  Delusions None reported or observed  Perception WDL  Hallucination None reported or observed  Judgment Impaired  Confusion None  Danger to Self  Current suicidal ideation? Denies  Agreement Not to Harm Self Yes  Description of Agreement verbal  Danger to Others  Danger to Others None reported or observed

## 2023-03-24 NOTE — [facility] Counselor (Signed)
Adult Comprehensive Assessment  Patient ID: Deanna [name], female   DOB: 1996-06-23, 27 y.o.   MRN: [id number]  Information Source: Information source: Patient  Current Stressors:  Patient states their primary concerns and needs for treatment are:: 27 y/o female pt presents to [facility] after an intentional overdose. Pt attributes this to an argument with her girlfriend of 3 years. 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 Patient states their goals for this hospitilization and ongoing recovery are:: Photographer / Learning stressors: pt denied Employment / Job issues: None reported Family Relationships: "I don't talk to my familyEngineer, petroleum / Lack of resources (include bankruptcy): none reported Housing / Lack of housing: pt denied Physical health (include injuries & life threatening diseases): pt denied Social relationships: "I don't have a lot of friends" Substance abuse: pt denied Bereavement / Loss: none reported  Living/Environment/Situation:  Living Arrangements: Non-relatives/Friends (Room mate) Who else lives in the home?: roommate How long has patient lived in current situation?: 1 month What is atmosphere in current home: Comfortable, Supportive  Family History:  Marital status: Long term relationship Long term relationship, how long?: 3 years Are you sexually active?: Yes What is your sexual orientation?: Lesbian Has your sexual activity been affected by drugs, alcohol, medication, or emotional stress?: "Sometimes" Does patient have children?: No  Childhood History:  By whom was/is the patient raised?: Mother Description of patient's relationship with caregiver when they were a child: "OK" Patient's description of current relationship with people who raised him/her: "OK" How were you disciplined when you got in trouble as a child/adolescent?: Punishment Does patient have siblings?: No Did patient  suffer any verbal/emotional/physical/sexual abuse as a child?: No Has patient ever been sexually abused/assaulted/raped as an adolescent or adult?: No Was the patient ever a victim of a crime or a disaster?: No Witnessed domestic violence?: No Has patient been affected by domestic violence as an adult?: No  Education:  Highest grade of school patient has completed: Some Automotive engineer Currently a Consulting civil engineer?: No Learning disability?: No  Employment/Work Situation:   Employment Situation: Employed Where is Patient Currently Employed?: "[employer]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?: No  Financial Resources:   Financial resources: Income from employment, Medicaid Does patient have a representative payee or guardian?: No  Alcohol/Substance Abuse:   What has been your use of drugs/alcohol within the last 12 months?: pt denied If attempted suicide, did drugs/alcohol play a role in this?: No Alcohol/Substance Abuse Treatment Hx: Denies past history Has alcohol/substance abuse ever caused legal problems?: No  Social Support System:   Conservation officer, nature Support System: Fair Development worker, community Support System: fair Type of faith/religion: Christian How does patient's faith help to cope with current illness?: DNA  Leisure/Recreation:   Do You Have Hobbies?: Yes Leisure and Hobbies: Video Games  Strengths/Needs:   What is the patient's perception of their strengths?: "I like helping people" Patient states they can use these personal strengths during their treatment to contribute to their recovery: "I will go back to therapy" Patient states these barriers may affect/interfere with their treatment: work schedule Patient states these barriers may affect  their return to the  community: none reported  Discharge Plan:   Currently receiving community mental health services: No Patient states concerns and preferences for aftercare planning are: [facility] Patient states they will know when they are safe and ready for discharge when: Once I start taking my medications again Does patient have access to transportation?: No Does patient have financial barriers related to discharge medications?: No Patient description of barriers related to discharge medications: none reported Plan for no access to transportation at discharge: Taxi/Friend Will patient be returning to same living situation after discharge?: Yes  Summary/Recommendations:   Summary and Recommendations (to be completed by the evaluator): 27 y/o female pt presents to [facility] after an intentional overdose of pills. Pt attributes this to a recent break-up with her signifcant other. Pt reports that she has been seen by Karma Lew for her Mental Health but states that she has not been seen in a while. Pt is employed and currently lives wih he roommate . Pt currently denies SI/HI and AVH. While here, [place] can benefit from crisis stabilization, medication management, therapeutic milieu, and referrals for services.  [name]. 03/24/2023

## 2023-03-24 NOTE — [facility] Suicide Risk Assessment (Signed)
[facility] INPATIENT:  Family/Significant Other Suicide Prevention Education  Suicide Prevention Education:  Education Completed; 03-24-2023 Deanna [name] [phone] [facility] Other) has been identified by the patient as the family member/significant other with whom the patient will be residing, and identified as the person(s) who will aid the patient in the event of a mental health crisis (suicidal ideations/suicide attempt).  With written consent from the patient, the Deanna [name] [phone] [facility] Other) has been provided the following suicide prevention education, prior to the and/or following the discharge of the patient.  The suicide prevention education provided includes the following: Suicide risk factors Suicide prevention and interventions National Suicide Hotline telephone number [facility] assessment telephone number [facility] Emergency Assistance 911 [facility] and/or Residential Mobile Crisis Unit telephone number  Request made of family/significant other to: Remove weapons (e.g., guns, rifles, knives), all items previously/currently identified as safety concern.   Remove drugs/medications (over-the-counter, prescriptions, illicit drugs), all items previously/currently identified as a safety concern.  Deanna [name] [phone] (Sig Other) verbalizes understanding of the suicide prevention education information provided.  The family member/significant other agrees to remove the items of safety concern listed above.  Deanna [name] S Joci Dress 03/24/2023, 3:21 PM

## 2023-03-24 NOTE — Group Note (Signed)
Date:  03/24/2023 Time:  11:39 AM  Group Topic/Focus:  Goals Group:   The focus of this group is to help patients establish daily goals to achieve during treatment and discuss how the patient can incorporate goal setting into their daily lives to aide in recovery. Orientation:   The focus of this group is to educate the patient on the purpose and policies of crisis stabilization and provide a format to answer questions about their admission.  The group details unit policies and expectations of patients while admitted.    Participation Level:  Did Not Attend  Participation Quality:   n/a  Affect:   n/a  Cognitive:   n/a  Insight: None  Engagement in Group:   n/a  Modes of Intervention:   n/a  Additional Comments:   Pt. did not attend the orientation/goals group.  Edmund Hilda Jermani Eberlein 03/24/2023, 11:39 AM

## 2023-03-24 NOTE — Plan of Care (Signed)
  Problem: Education: Goal: Knowledge of [facility] Education information/materials will improve Outcome: Progressing   Problem: Education: Goal: Mental status will improve Outcome: Progressing   Problem: Activity: Goal: Sleeping patterns will improve Outcome: Progressing   Problem: Coping: Goal: Ability to verbalize frustrations and anger appropriately will improve Outcome: Progressing

## 2023-03-24 NOTE — Group Note (Signed)
LCSW Group Therapy Note   Group Date: 03/24/2023 Start Time: 1100 End Time: 1200   Type of Therapy and Topic:  Group Therapy - Coping Skills For Anxiety and Depression  Participation Level:  Did Not Attend   Description of Group The focus of this group was to determine what healthy coping techniques would be helpful for group members in coping with anxiety and depression in their daily lives. The group began with patients introducing themselves and revealing one healthy and one unhealthy way they have coped with anxiety or depression in the past. Patients were guided through different techniques for coping with anxiety in a healthy way, including deep breathing, progressive muscle relaxation, challenging irrational thoughts, and mental imagery. Patients were then guided though different techniques for coping with depression in a healthy way, including behavioral activation, increasing social supports, focusing on positive experiences, and mindfulness. Differences between healthy and unhealthy coping techniques were pointed out when brought up by group members. Patients were asked to identify 2-3 healthy coping skills they would like to learn to use more effectively after being guided through these different techniques.These were explained, samples demonstrated, and resources shared for how to learn more at discharge.  Therapeutic Goals Patients learned that coping is what human beings do to deal with various situations in their lives. Patients learned various healthy coping techniques for anxiety and depression Patients determined 2-3 healthy coping skills they would like to become more familiar with and use more often. Patients provided support and ideas to each other.   Summary of Patient Progress:    Did not attend   Therapeutic Modalities Cognitive Behavioral Therapy Motivational Interviewing Dialectical Behavioral Therapy  Izell Appling, LCSW 03/24/2023  1:20 PM

## 2023-03-24 NOTE — Group Note (Signed)
Date:  03/24/2023 Time:  5:19 PM  Group Topic/Focus:  Goals Group:   The focus of this group is to help patients establish daily goals to achieve during treatment and discuss how the patient can incorporate goal setting into their daily lives to aide in recovery. Orientation:   The focus of this group is to educate the patient on the purpose and policies of crisis stabilization and provide a format to answer questions about their admission.  The group details unit policies and expectations of patients while admitted.    Participation Level:  Did Not Attend  Participation Quality:   n/a  Affect:   n/a  Cognitive:   n/a  Insight: None  Engagement in Group:   n/a  Modes of Intervention:   n/a  Additional Comments:   Pt did not attend  Stark Bray 03/24/2023, 5:19 PM

## 2023-03-24 NOTE — Progress Notes (Signed)
   03/24/23 0600  15 Minute Checks  Location Bedroom  Visual Appearance Calm  Behavior Sleeping  Sleep (Behavioral Health Patients Only)  Calculate sleep? (Click Yes once per 24 hr at 0600 safety check) Yes  Documented sleep last 24 hours 10.25

## 2023-03-24 NOTE — Plan of Care (Signed)
  Problem: Education: Goal: Emotional status will improve Outcome: Progressing Goal: Mental status will improve Outcome: Progressing   Problem: Activity: Goal: Interest or engagement in activities will improve Outcome: Progressing Goal: Sleeping patterns will improve Outcome: Progressing

## 2023-03-24 NOTE — Progress Notes (Signed)
   03/24/23 1000  Psych Admission Type (Psych Patients Only)  Admission Status Voluntary  Psychosocial Assessment  Patient Complaints None  Eye Contact Fair  Facial Expression Flat  Affect Flat  Speech Soft  Interaction Isolative;Minimal  Motor Activity Slow  Appearance/Hygiene Unremarkable  Behavior Characteristics Guarded  Mood Depressed  Thought Process  Coherency WDL  Content WDL  Delusions None reported or observed  Perception WDL  Hallucination None reported or observed  Judgment Impaired  Confusion None  Danger to Self  Current suicidal ideation? Denies  Agreement Not to Harm Self Yes  Description of Agreement Verbal  Danger to Others  Danger to Others None reported or observed

## 2023-03-24 NOTE — Group Note (Signed)
Recreation Therapy Group Note   Group Topic:Animal Assisted Therapy   Group Date: 03/24/2023 Start Time: 0946 End Time: 1030 Facilitators: Mathhew Buysse-McCall, LRT,CTRS Location: [address] Dayroom   Animal-Assisted Activity (AAA) Program Checklist/Progress Notes Patient Eligibility Criteria Checklist & Daily Group note for Rec Tx Intervention  AAA/T Program Assumption of Risk Form signed by Patient/ or Parent Legal Guardian Yes  Patient understands his/her participation is voluntary Yes   Affect/Mood: N/A   Participation Level: Did not attend    Clinical Observations/Individualized Feedback:     Plan: Continue to engage patient in RT group sessions 2-3x/week.   Lakya Schrupp-McCall, LRT,CTRS 03/24/2023 12:44 PM

## 2023-03-24 NOTE — Progress Notes (Signed)
   03/23/23 2140  Psych Admission Type (Psych Patients Only)  Admission Status Voluntary  Psychosocial Assessment  Patient Complaints None  Eye Contact Fair  Facial Expression Flat  Affect Flat  Speech Logical/coherent  Interaction Assertive  Motor Activity Slow  Appearance/Hygiene Unremarkable  Behavior Characteristics Cooperative  Mood Depressed  Thought Process  Coherency WDL  Content WDL  Delusions None reported or observed  Perception WDL  Hallucination None reported or observed  Judgment Limited  Confusion None  Danger to Self  Current suicidal ideation? Denies  Agreement Not to Harm Self Yes  Description of Agreement verbal  Danger to Others  Danger to Others None reported or observed

## 2023-03-24 NOTE — Progress Notes (Signed)
[facility] MD Progress Note  03/24/2023 4:03 PM Deanna [name]  MRN:  [id number]  Principal Problem: Bipolar disorder [facility]) Diagnosis: Principal Problem:   Bipolar disorder [facility])  Reason for admission:  Deanna Miner. [name] is a 27 year old AA female with prior psychiatric history significant for bipolar disorder depressed type, suicide attempts with OD on medication, GAD who presents voluntarily to [facility] from [facility] health ED at [facility] for overdose with unresponsiveness with unknown substance in the context of break-up with her significant other. After medical evaluation/stabilization & clearance, she was transferred to the [facility] for further psychiatric evaluation & treatments.   Yesterday the psychiatry team made the following recommendations:  Continue Abilify 5 mg p.o. daily for mood stabilization Continue hydroxyzine tablets 25 mg p.o. 3 times daily as needed for anxiety Continue trazodone tablets 50 mg p.o. at bedtime as needed for insomnia Continue home med colecalciferol 25 mg 1 tablet 1000 unit total p.o. daily for vitamin D deficiency Completed potassium chloride 40 mEq p.o. x 1 dose only for K+ 3.3.  Awaiting results of BMP   Today's assessment note: On assessment today, the pt reports that her mood is not depressed, however, mood is appears depressed with flat affect.  She seems to be minimizing her symptoms since admission, however, accepting psychotropic medications for her bipolar disorder. [name] is alert, calm, cooperative and participating reluctantly in assessment questions.  Chart reviewed and findings shared with the treatment team and consult with attending psychiatrist.  As per therapist notes, patient is not participating in any  therapeutic milieu or unit group activities today.  She presents none-invested and with no motivation.  He denies any acute distress.  Denies delusional thinking or paranoia. Nursing staff reports patient sleeping over 10.25 hours sleep last  night.  Appetite is good Concentration is improving Energy level is average Denies suicidal thoughts and denies suicidal intent or plan.  Able to contract for safety while in the hospital Denies having any HI.  Denies having psychotic symptoms.   Denies having side effects to current psychiatric medications.   We discussed compliance to current medication regimen.  Discussed the following psychosocial stressors: Attending therapeutic milieu and unit group activities.  Total Time spent with patient: 45 minutes  Past Psychiatric History: Previous Psych Diagnoses: Bipolar disorder, SI with attempted self injury. Prior inpatient treatment: Yes, in 2018 at the [facility] Current/prior outpatient treatment: Denies Prior rehab hx: Denies Psychotherapy hx: Yes History of suicide: Yes x 1 in 2018 History of homicide or aggression: Denies Psychiatric medication history: Yes, patient has prior history of Remeron, Lamictal, and hydroxyzine Psychiatric medication compliance history: Yes, patient reports compliance Neuromodulation history: Denies Current Psychiatrist: Denies current psychiatrist Current therapist: Denies current therapist  Past Medical History:  Past Medical History:  Diagnosis Date   Allergy    Anxiety    Asthma    Depression    Eczema    Ovarian cyst    resolved    Past Surgical History:  Procedure Laterality Date   OVARIAN CYST REMOVAL     Family History:  Family History  Problem Relation Age of Onset   Asthma Mother    Bipolar disorder Mother    Asthma Father    Hypertension Father    Bipolar disorder Father    Hypertension Paternal Grandmother    Diabetes Paternal Grandmother    Asthma Paternal Grandmother    Family Psychiatric  History: See H&P  Social History:  Social History   Substance and  Sexual Activity  Alcohol Use No     Social History   Substance and Sexual Activity  Drug Use No    Social History   Socioeconomic History   Marital  status: Significant Other    Spouse name: Not on file   Number of children: Not on file   Years of education: Not on file   Highest education level: Not on file  Occupational History   Not on file  Tobacco Use   Smoking status: Every Day    Current packs/day: 0.50    Types: Cigarettes   Smokeless tobacco: Never  Vaping Use   Vaping status: Every Day  Substance and Sexual Activity   Alcohol use: No   Drug use: No   Sexual activity: Yes    Partners: Female    Birth control/protection: None  Other Topics Concern   Not on file  Social History Narrative   Not on file   Social Determinants of Health   Financial Resource Strain: Not on file  Food Insecurity: Patient Declined (03/22/2023)   Hunger Vital Sign    Worried About Running Out of Food in the Last Year: Patient declined    Ran Out of Food in the Last Year: Patient declined  Transportation Needs: Patient Declined (03/22/2023)   PRAPARE - Administrator, Civil Service (Medical): Patient declined    Lack of Transportation (Non-Medical): Patient declined  Physical Activity: Not on file  Stress: Not on file  Social Connections: Not on file   Additional Social History:    Sleep: Good  Appetite:  Good  Current Medications: Current Facility-Administered Medications  Medication Dose Route Frequency Provider Last Rate Last Admin   acetaminophen (TYLENOL) tablet 650 mg  650 mg Oral Q6H PRN Lucita Montoya, Jesusita Oka, FNP       alum & mag hydroxide-simeth (MAALOX/MYLANTA) 200-200-20 MG/5ML suspension 30 mL  30 mL Oral Q4H PRN Garey Alleva, Jesusita Oka, FNP       ARIPiprazole (ABILIFY) tablet 5 mg  5 mg Oral Daily Nyrie Sigal, Jesusita Oka, FNP   5 mg at 03/24/23 0910   haloperidol (HALDOL) tablet 5 mg  5 mg Oral TID PRN Phineas Inches, MD       And   LORazepam (ATIVAN) tablet 2 mg  2 mg Oral TID PRN Massengill, Harrold Donath, MD       And   diphenhydrAMINE (BENADRYL) capsule 50 mg  50 mg Oral TID PRN Massengill, Harrold Donath, MD       haloperidol lactate  (HALDOL) injection 5 mg  5 mg Intramuscular TID PRN Massengill, Harrold Donath, MD       And   LORazepam (ATIVAN) injection 2 mg  2 mg Intramuscular TID PRN Massengill, Harrold Donath, MD       And   diphenhydrAMINE (BENADRYL) injection 50 mg  50 mg Intramuscular TID PRN Massengill, Harrold Donath, MD       hydrOXYzine (ATARAX) tablet 25 mg  25 mg Oral TID PRN Massengill, Harrold Donath, MD       magnesium hydroxide (MILK OF MAGNESIA) suspension 30 mL  30 mL Oral Daily PRN Gwyneth Fernandez, Jesusita Oka, FNP       traZODone (DESYREL) tablet 50 mg  50 mg Oral QHS PRN Massengill, Nathan, MD       vitamin D3 (CHOLECALCIFEROL) tablet 1,000 Units  1,000 Units Oral Daily Cecilie Lowers, FNP   1,000 Units at 03/24/23 6962   Lab Results:  Results for orders placed or performed during the hospital encounter of 03/22/23 (from  the past 48 hour(s))  Lipid panel     Status: Abnormal   Collection Time: 03/24/23  6:39 AM  Result Value Ref Range   Cholesterol 188 0 - 200 mg/dL   Triglycerides 638 <756 mg/dL   HDL 40 (L) >43 mg/dL   Total CHOL/HDL Ratio 4.7 RATIO   VLDL 21 0 - 40 mg/dL   LDL Cholesterol 329 (H) 0 - 99 mg/dL    Comment:        Total Cholesterol/HDL:CHD Risk Coronary Heart Disease Risk Table                     Men   Women  1/2 Average Risk   3.4   3.3  Average Risk       5.0   4.4  2 X Average Risk   9.6   7.1  3 X Average Risk  23.4   11.0        Use the calculated Patient Ratio above and the CHD Risk Table to determine the patient's CHD Risk.        ATP III CLASSIFICATION (LDL):  <100     mg/dL   Optimal  518-841  mg/dL   Near or Above                    Optimal  130-159  mg/dL   Borderline  660-630  mg/dL   High  >160     mg/dL   Very High Performed at [facility], [address] [place], [place] [address]   Hemoglobin A1c     Status: None   Collection Time: 03/24/23  6:39 AM  Result Value Ref Range   Hgb A1c MFr Bld 5.1 4.8 - 5.6 %    Comment: (NOTE) Pre diabetes:           5.7%-6.4%  Diabetes:              >6.4%  Glycemic control for   <7.0% adults with diabetes    Mean Plasma Glucose 99.67 mg/dL    Comment: Performed at [facility], [address] [place], [place] [address]  TSH     Status: None   Collection Time: 03/24/23  6:39 AM  Result Value Ref Range   TSH 0.775 0.350 - 4.500 uIU/mL    Comment: Performed by a 3rd Generation assay with a functional sensitivity of <=0.01 uIU/mL. Performed at [facility], [address], [place], [place] [address]     Blood Alcohol level:  Lab Results  Component Value Date   [facility] <10 03/19/2023   ETH <5 06/01/2016   Metabolic Disorder Labs: Lab Results  Component Value Date   HGBA1C 5.1 03/24/2023   MPG 99.67 03/24/2023   No results found for: "PROLACTIN" Lab Results  Component Value Date   CHOL 188 03/24/2023   TRIG 107 03/24/2023   HDL 40 (L) 03/24/2023   CHOLHDL 4.7 03/24/2023   VLDL 21 03/24/2023   LDLCALC 127 (H) 03/24/2023   Physical Findings: AIMS:  , ,  ,  ,    CIWA:    COWS:     Musculoskeletal: Strength & Muscle Tone: within normal limits Gait & Station: normal Patient leans: N/A  Psychiatric Specialty Exam:  Presentation  General Appearance:  Casual  Eye Contact: Fair  Speech: Clear and Coherent  Speech Volume: Normal  Handedness: Right  Mood and Affect  Mood: Depressed; Anxious  Affect: Congruent  Thought Process  Thought Processes: Linear  Descriptions of Associations:Intact  Orientation:Full (Time, Place and Person)  Thought Content:WDL  History of Schizophrenia/Schizoaffective disorder:No data recorded Duration of Psychotic Symptoms:No data recorded Hallucinations:Hallucinations: None  Ideas of Reference:None  Suicidal Thoughts:Suicidal Thoughts: No  Homicidal Thoughts:Homicidal Thoughts: No  Sensorium  Memory: Immediate Fair; Recent Fair  Judgment: Fair  Insight: Fair  Art therapist   Concentration: Fair  Attention Span: Fair  Recall: [employer] of Knowledge: Fair  Language: Fair  Psychomotor Activity  Psychomotor Activity: Psychomotor Activity: Normal  Assets  Assets: Communication Skills; Desire for Improvement; Physical Health; Resilience; Social Support  Sleep  Sleep: Sleep: Good Number of Hours of Sleep: 10.25  Physical Exam: Physical Exam Vitals and nursing note reviewed.  HENT:     Head: Normocephalic.     Nose: Nose normal.     Mouth/Throat:     Mouth: Mucous membranes are moist.  Eyes:     Extraocular Movements: Extraocular movements intact.  Cardiovascular:     Rate and Rhythm: Tachycardia present.  Pulmonary:     Effort: Pulmonary effort is normal.  Abdominal:     Comments: Deferred  Genitourinary:    Comments: Deferred Musculoskeletal:        General: Normal range of motion.     Cervical back: Normal range of motion.  Skin:    General: Skin is warm.  Neurological:     General: No focal deficit present.     Mental Status: She is alert and oriented to person, place, and time.  Psychiatric:        Mood and Affect: Mood normal.        Behavior: Behavior normal.    Review of Systems  Constitutional:  Negative for chills and fever.  HENT:  Negative for sore throat.   Eyes:  Negative for blurred vision.  Respiratory:  Negative for cough, shortness of breath and wheezing.   Cardiovascular:  Negative for chest pain and palpitations.  Gastrointestinal:  Negative for abdominal pain, heartburn, nausea and vomiting.  Genitourinary:  Negative for dysuria, frequency and urgency.  Musculoskeletal:  Negative for back pain, joint pain, myalgias and neck pain.  Skin:  Negative for itching and rash.  Neurological:  Negative for dizziness, tingling, tremors and headaches.  Endo/Heme/Allergies:        See allergy listing  Psychiatric/Behavioral:  Positive for depression. The patient is nervous/anxious.    Blood pressure (!) 106/58,  pulse (!) 118, temperature 98.6 F (37 C), temperature source Oral, resp. rate 16, height 5\' 3"  (1.6 m), weight 74.5 kg, last menstrual period 02/16/2023, SpO2 99%. Body mass index is 29.09 kg/m.  Treatment Plan Summary: Daily contact with patient to assess and evaluate symptoms and progress in treatment and Medication management Assessment: Bipolar disorder recurrent episode depressed Suicide attempt via OD GAD H/o SA via OD in 2018   Plans: Medications: Continue Abilify 5 mg p.o. daily for mood stabilization Continue hydroxyzine tablets 25 mg p.o. 3 times daily as needed for anxiety Continue trazodone tablets 50 mg p.o. at bedtime as needed for insomnia Continue home med colecalciferol 25 mg 1 tablet 1000 unit total p.o. daily for vitamin D deficiency Completed potassium chloride 40 mEq p.o. x 1 dose only for K+ 3.3.  Awaiting results of BMP   Agitation protocol: Risperdal M-tabs disintegrating tablets 2 mg p.o. every 8 hours as needed agitation and Geodon injection 20 mg IM as needed x 1 dose and Lorazepam tablet 1 mg p.o. as needed  agitation x 1 dose only   Other PRN Medications -Acetaminophen 650 mg every 6 as needed/mild pain -Maalox 30 mL oral every 4 as needed/digestion -Magnesium hydroxide 30 mL daily as needed/mild constipation   -- The risks/benefits/side-effects/alternatives to this medication were discussed in detail with the patient and time was given for questions. The patient consents to medication trial.  -- Metabolic profile and EKG monitoring obtained while on an atypical antipsychotic (BMI: Lipid Panel: HbgA1c: QTc:)  -- Encouraged patient to participate in unit milieu and in scheduled group therapies    Abnormal Labs reviewed: CMP: K+ 3.3 replace with potassium chloride 40 mEq x 1 dose only, Ca +8.5, creatinine 1.02, vitamin D 25-hydroxy 16.72.  BAL less than 10.  UDS: None dictated   Labs ordered: Hemoglobin A1c: 5.1 normal, lipid panel: HDL 40 low, LDL 127  high,  TSH: 0.775 normal.  Awaiting results of BMP   EKG reviewed: NSR with sinus arrhythmia, ventricular rate 62, QT/QTc 432/438   Safety and Monitoring: Voluntary admission to inpatient psychiatric unit for safety, stabilization and treatment Daily contact with patient to assess and evaluate symptoms and progress in treatment Patient's case to be discussed in multi-disciplinary team meeting Observation Level : q15 minute checks Vital signs: q12 hours Precautions: suicide, but pt currently verbally contracts for safety on unit    Discharge Planning: Social work and case management to assist with discharge planning and identification of hospital follow-up needs prior to discharge Estimated LOS: 5-7 days Discharge Concerns: Need to establish a safety plan; Medication compliance and effectiveness Discharge Goals: Return home with outpatient referrals for mental health follow-up including medication management/psychotherapy.   Physician Treatment Plan for Primary Diagnosis: Bipolar disorder (HCC) Long Term Goal(s): Improvement in symptoms so as ready for discharge   Short Term Goals: Ability to identify changes in lifestyle to reduce recurrence of condition will improve, Ability to verbalize feelings will improve, Ability to disclose and discuss suicidal ideas, Ability to demonstrate self-control will improve, Ability to identify and develop effective coping behaviors will improve, Ability to maintain clinical measurements within normal limits will improve, Compliance with prescribed medications will improve, and Ability to identify triggers associated with substance abuse/mental health issues will improve   Physician Treatment Plan for Secondary Diagnosis:    Principal Problem:   Bipolar disorder (HCC)   I certify that inpatient services furnished can reasonably be expected to improve the patient's condition.      Cecilie Lowers, FNP 03/24/2023, 4:03 PM

## 2023-03-24 NOTE — B[facility] Group Notes (Signed)
Adult Psychoeducational Group Note  Date:  03/24/2023 Time:  9:26 PM  Group Topic/Focus:  Wrap-Up Group:   The focus of this group is to help patients review their daily goal of treatment and discuss progress on daily workbooks.  Participation Level:  Did Not Attend  Deanna [name] 03/24/2023, 9:26 PM

## 2023-03-25 DIAGNOSIS — F314 Bipolar disorder, current episode depressed, severe, without psychotic features: Secondary | ICD-10-CM | POA: Diagnosis not present

## 2023-03-25 MED ORDER — ARIPIPRAZOLE 15 MG PO TABS
7.5000 mg | ORAL_TABLET | Freq: Every day | ORAL | Status: DC
  Administered 2023-03-26 – 2023-03-27 (×2): 7.5 mg via ORAL
  Filled 2023-03-25 (×4): qty 1

## 2023-03-25 MED ORDER — TRIAMCINOLONE ACETONIDE 0.5 % EX OINT
TOPICAL_OINTMENT | Freq: Two times a day (BID) | CUTANEOUS | Status: DC
  Filled 2023-03-25: qty 15

## 2023-03-25 MED ORDER — ARIPIPRAZOLE 10 MG PO TABS
10.0000 mg | ORAL_TABLET | Freq: Every day | ORAL | Status: DC
  Administered 2023-03-28 – 2023-03-31 (×4): 10 mg via ORAL
  Filled 2023-03-25 (×6): qty 1

## 2023-03-25 NOTE — Group Note (Signed)
Recreation Therapy Group Note   Group Topic:Other  Group Date: 03/25/2023 Start Time: 1405 End Time: 1450 Facilitators: Kathryn Linarez-McCall, LRT,CTRS Location: [address] Dayroom   Activity Description/Intervention: Therapeutic Drumming. Patients with peers and staff were given the opportunity to engage in a leader facilitated [facility] Group Empowerment Drumming Circle with staff from the [employer], in partnership with [employer]. Teaching laboratory technician and trained [employer], Theodoro Doing leading with LRT observing and documenting intervention and pt response. This evidenced-based practice targets 7 areas of health and wellbeing in the human experience including: stress-reduction, exercise, self-expression, camaraderie/support, nurturing, spirituality, and music-making (leisure).   Goal Area(s) Addresses:  Patient will engage in pro-social way in music group.  Patient will follow directions of drum leader on the first prompt. Patient will demonstrate no behavioral issues during group.  Patient will identify if a reduction in stress level occurs as a result of participation in therapeutic drum circle.    Education: Leisure exposure, Pharmacologist, Musical expression, Discharge Planning   Affect/Mood: N/A   Participation Level: Did not attend    Clinical Observations/Individualized Feedback:     Plan: Continue to engage patient in RT group sessions 2-3x/week.   Chester Romero-McCall, LRT,CTRS  03/25/2023 3:16 PM

## 2023-03-25 NOTE — B[facility] Group Notes (Signed)
Spiritual care group facilitated by Chaplain Dyanne Carrel, [facility]  Group focused on topic of strength. Group members reflected on what thoughts and feelings emerge when they hear this topic. They then engaged in facilitated dialog around how strength is present in their lives. This dialog focused on representing what strength had been to them in their lives (images and patterns given) and what they saw as helpful in their life now (what they needed / wanted).  Activity drew on narrative framework.  Patient Progress: Deanna [name] attended group and actively engaged and participated in group conversation and activities.

## 2023-03-25 NOTE — Plan of Care (Signed)
°  Problem: Education: °Goal: Emotional status will improve °Outcome: Progressing °Goal: Mental status will improve °Outcome: Progressing °Goal: Verbalization of understanding the information provided will improve °Outcome: Progressing °  °

## 2023-03-25 NOTE — Plan of Care (Signed)
  Problem: Education: Goal: Knowledge of [facility] Education information/materials will improve Outcome: Progressing Goal: Emotional status will improve Outcome: Progressing Goal: Mental status will improve Outcome: Progressing Goal: Verbalization of understanding the information provided will improve Outcome: Progressing   

## 2023-03-25 NOTE — Progress Notes (Signed)
   03/25/23 0805  Psych Admission Type (Psych Patients Only)  Admission Status Voluntary  Psychosocial Assessment  Patient Complaints None  Eye Contact Brief  Facial Expression Flat  Affect Depressed;Flat  Speech Logical/coherent  Interaction Isolative;Minimal  Motor Activity Slow  Appearance/Hygiene Unremarkable  Behavior Characteristics Guarded  Mood Depressed  Thought Process  Coherency WDL  Content WDL  Delusions None reported or observed  Perception WDL  Hallucination None reported or observed  Judgment Impaired  Confusion None  Danger to Self  Current suicidal ideation? Denies  Agreement Not to Harm Self Yes  Description of Agreement Verbal  Danger to Others  Danger to Others None reported or observed

## 2023-03-25 NOTE — [facility] Group Notes (Signed)
Adult Psychoeducational Group Note  Date:  03/25/2023 Time:  10:52 PM  Group Topic/Focus:  Narcotics Anonymos  Participation Level:  Active  Participation Quality:  Appropriate  Affect:  Appropriate  Cognitive:  Appropriate  Insight: Appropriate  Engagement in Group:  Engaged  Modes of Intervention:  Education  Additional Comments:  Pt attend and participated in th NA group. Listen to the speakers as they share their stories. Pt also took part in the serenity prayer.  Lujuana Kapler, Sharen Counter 03/25/2023, 10:52 PM

## 2023-03-25 NOTE — Progress Notes (Signed)
[facility] MD Progress Note  03/25/2023 4:50 PM TANESHA ARAMBULA  MRN:  [id number]  Principal Problem: Bipolar disorder [facility]) Diagnosis: Principal Problem:   Bipolar disorder [facility])  Reason for admission:  [name]. Brandstetter is a 27 year old AA female with prior psychiatric history significant for bipolar disorder depressed type, suicide attempts with OD on medication, GAD who presents voluntarily to [facility] from [facility] health ED at [facility] for overdose with unresponsiveness with unknown substance in the context of break-up with her significant other. After medical evaluation/stabilization & clearance, she was transferred to the [facility] for further psychiatric evaluation & treatments.   Yesterday the psychiatry team made the following recommendations:  Increase Abilify tablet from 5 mg to 7.5 mg p.o. daily for mood stabilization x 2 days, then increase to 10 mg p.o. daily starting 03/28/2023 Continue hydroxyzine tablets 25 mg p.o. 3 times daily as needed for anxiety Continue trazodone tablets 50 mg p.o. at bedtime as needed for insomnia Continue home med colecalciferol 25 mg 1 tablet 1000 unit total p.o. daily for vitamin D deficiency Completed potassium chloride 40 mEq p.o. x 1 dose only for K+ 3.3; K+ level 3.8 on 03-25-23.   Today's assessment note: On assessment today, the pt continues to report that her mood is not depressed, however, mood is appears depressed with flat affect.  She continues to be minimizing her symptoms.  However, accepting psychotropic medications for her bipolar disorder.  Abilify tablet increased from 5 mg p.o. daily to 7.5 mg p.o. daily for mood stabilization.  Observed Na+ level trending down from 136 to 133, BMP to be obtained on 03/27/2023.  Anjoli is alert, calm, cooperative and participating in assessment questions.  When asked about her day, today, responded, "I am interacting more with some patients here and I am okay."  Observed with a little smile to her face.   Observed itching skin and arms, triamcinolone 5% cream initiated for patient eczema.  Chart reviewed and findings shared with the treatment team and consult with attending psychiatrist.  As per therapist notes, patient attended Spiritual Care Group Therapy but not Recreational group therapy, this is improvement since admission.  Seen lining up with other patients for meals in the cafeteria during lunch.  She denies any acute distress.  When asked about bowel movement, denies constipation and reports, "I am okay. " Denies delusional thinking or paranoia. Nursing staff reports patient sleeping over 8.25 hours last night.  Appetite is good Concentration is improving Energy level is average Denies suicidal thoughts and denies suicidal intent or plan.  Able to contract for safety while in the hospital Denies having any HI.  Denies having psychotic symptoms.   Denies having side effects to current psychiatric medications.   We discussed compliance to current medication regimen and adjusting her Abilify dosage from 5 mg to 7.5 mg p.o. daily.  Patient in agreement with medication adjustment.  Discussed the following psychosocial stressors: Attending therapeutic milieu and unit group activities.  Total Time spent with patient: 45 minutes  Past Psychiatric History: Previous Psych Diagnoses: Bipolar disorder, SI with attempted self injury. Prior inpatient treatment: Yes, in 2018 at the [facility] Current/prior outpatient treatment: Denies Prior rehab hx: Denies Psychotherapy hx: Yes History of suicide: Yes x 1 in 2018 History of homicide or aggression: Denies Psychiatric medication history: Yes, patient has prior history of Remeron, Lamictal, and hydroxyzine Psychiatric medication compliance history: Yes, patient reports compliance Neuromodulation history: Denies Current Psychiatrist: Denies current psychiatrist Current therapist: Denies current  therapist  Past Medical History:  Past Medical History:   Diagnosis Date   Allergy    Anxiety    Asthma    Depression    Eczema    Ovarian cyst    resolved    Past Surgical History:  Procedure Laterality Date   OVARIAN CYST REMOVAL     Family History:  Family History  Problem Relation Age of Onset   Asthma Mother    Bipolar disorder Mother    Asthma Father    Hypertension Father    Bipolar disorder Father    Hypertension Paternal Grandmother    Diabetes Paternal Grandmother    Asthma Paternal Grandmother    Family Psychiatric  History: See H&P  Social History:  Social History   Substance and Sexual Activity  Alcohol Use No     Social History   Substance and Sexual Activity  Drug Use No    Social History   Socioeconomic History   Marital status: Significant Other    Spouse name: Not on file   Number of children: Not on file   Years of education: Not on file   Highest education level: Not on file  Occupational History   Not on file  Tobacco Use   Smoking status: Every Day    Current packs/day: 0.50    Types: Cigarettes   Smokeless tobacco: Never  Vaping Use   Vaping status: Every Day  Substance and Sexual Activity   Alcohol use: No   Drug use: No   Sexual activity: Yes    Partners: Female    Birth control/protection: None  Other Topics Concern   Not on file  Social History Narrative   Not on file   Social Determinants of Health   Financial Resource Strain: Not on file  Food Insecurity: Patient Declined (03/22/2023)   Hunger Vital Sign    Worried About Running Out of Food in the Last Year: Patient declined    Ran Out of Food in the Last Year: Patient declined  Transportation Needs: Patient Declined (03/22/2023)   PRAPARE - Administrator, Civil Service (Medical): Patient declined    Lack of Transportation (Non-Medical): Patient declined  Physical Activity: Not on file  Stress: Not on file  Social Connections: Not on file   Additional Social History:    Sleep: Good  Appetite:   Good  Current Medications: Current Facility-Administered Medications  Medication Dose Route Frequency Provider Last Rate Last Admin   acetaminophen (TYLENOL) tablet 650 mg  650 mg Oral Q6H PRN Darion Milewski, Jesusita Oka, FNP       alum & mag hydroxide-simeth (MAALOX/MYLANTA) 200-200-20 MG/5ML suspension 30 mL  30 mL Oral Q4H PRN Taichi Repka, Jesusita Oka, FNP       ARIPiprazole (ABILIFY) tablet 5 mg  5 mg Oral Daily Takyah Ciaramitaro, Jesusita Oka, FNP   5 mg at 03/25/23 0839   haloperidol (HALDOL) tablet 5 mg  5 mg Oral TID PRN Phineas Inches, MD       And   LORazepam (ATIVAN) tablet 2 mg  2 mg Oral TID PRN Massengill, Harrold Donath, MD       And   diphenhydrAMINE (BENADRYL) capsule 50 mg  50 mg Oral TID PRN Massengill, Harrold Donath, MD       haloperidol lactate (HALDOL) injection 5 mg  5 mg Intramuscular TID PRN Massengill, Harrold Donath, MD       And   LORazepam (ATIVAN) injection 2 mg  2 mg Intramuscular TID PRN Phineas Inches, MD  And   diphenhydrAMINE (BENADRYL) injection 50 mg  50 mg Intramuscular TID PRN Massengill, Harrold Donath, MD       hydrOXYzine (ATARAX) tablet 25 mg  25 mg Oral TID PRN Phineas Inches, MD   25 mg at 03/24/23 2123   magnesium hydroxide (MILK OF MAGNESIA) suspension 30 mL  30 mL Oral Daily PRN Sila Sarsfield, Jesusita Oka, FNP       traZODone (DESYREL) tablet 50 mg  50 mg Oral QHS PRN Phineas Inches, MD   50 mg at 03/24/23 2123   triamcinolone ointment (KENALOG) 0.5 %   Topical BID Adya Wirz, Jesusita Oka, FNP       vitamin D3 (CHOLECALCIFEROL) tablet 1,000 Units  1,000 Units Oral Daily Allegra Cerniglia, Jesusita Oka, FNP   1,000 Units at 03/25/23 0272   Lab Results:  Results for orders placed or performed during the hospital encounter of 03/22/23 (from the past 48 hour(s))  Lipid panel     Status: Abnormal   Collection Time: 03/24/23  6:39 AM  Result Value Ref Range   Cholesterol 188 0 - 200 mg/dL   Triglycerides 536 <644 mg/dL   HDL 40 (L) >03 mg/dL   Total CHOL/HDL Ratio 4.7 RATIO   VLDL 21 0 - 40 mg/dL   LDL Cholesterol 474 (H) 0 - 99  mg/dL    Comment:        Total Cholesterol/HDL:CHD Risk Coronary Heart Disease Risk Table                     Men   Women  1/2 Average Risk   3.4   3.3  Average Risk       5.0   4.4  2 X Average Risk   9.6   7.1  3 X Average Risk  23.4   11.0        Use the calculated Patient Ratio above and the CHD Risk Table to determine the patient's CHD Risk.        ATP III CLASSIFICATION (LDL):  <100     mg/dL   Optimal  259-563  mg/dL   Near or Above                    Optimal  130-159  mg/dL   Borderline  875-643  mg/dL   High  >329     mg/dL   Very High Performed at [facility], [address] [place], [place] [address]   Hemoglobin A1c     Status: None   Collection Time: 03/24/23  6:39 AM  Result Value Ref Range   Hgb A1c MFr Bld 5.1 4.8 - 5.6 %    Comment: (NOTE) Pre diabetes:          5.7%-6.4%  Diabetes:              >6.4%  Glycemic control for   <7.0% adults with diabetes    Mean Plasma Glucose 99.67 mg/dL    Comment: Performed at [facility], [address], [place], [place] [address]  TSH     Status: None   Collection Time: 03/24/23  6:39 AM  Result Value Ref Range   TSH 0.775 0.350 - 4.500 uIU/mL    Comment: Performed by a 3rd Generation assay with a functional sensitivity of <=0.01 uIU/mL. Performed at [facility], [address] [place], [place] [address]   Basic metabolic panel     Status: Abnormal   Collection Time:  03/24/23  6:37 PM  Result Value Ref Range   Sodium 133 (L) 135 - 145 mmol/L   Potassium 3.8 3.5 - 5.1 mmol/L   Chloride 100 98 - 111 mmol/L   CO2 21 (L) 22 - 32 mmol/L   Glucose, Bld 118 (H) 70 - 99 mg/dL    Comment: Glucose reference range applies only to samples taken after fasting for at least 8 hours.   BUN 13 6 - 20 mg/dL   Creatinine, Ser 7.82 0.44 - 1.00 mg/dL   Calcium 9.5 8.9 - 95.6 mg/dL   GFR, Estimated >21 >30 mL/min    Comment: (NOTE) Calculated using the CKD-EPI Creatinine Equation  (2021)    Anion gap 12 5 - 15    Comment: Performed at [facility], [address] [place], [place] [address]    Blood Alcohol level:  Lab Results  Component Value Date   [facility] <10 03/19/2023   ETH <5 06/01/2016   Metabolic Disorder Labs: Lab Results  Component Value Date   HGBA1C 5.1 03/24/2023   MPG 99.67 03/24/2023   No results found for: "PROLACTIN" Lab Results  Component Value Date   CHOL 188 03/24/2023   TRIG 107 03/24/2023   HDL 40 (L) 03/24/2023   CHOLHDL 4.7 03/24/2023   VLDL 21 03/24/2023   LDLCALC 127 (H) 03/24/2023   Physical Findings: AIMS:  , ,  ,  ,    CIWA:    COWS:     Musculoskeletal: Strength & Muscle Tone: within normal limits Gait & Station: normal Patient leans: N/A  Psychiatric Specialty Exam:  Presentation  General Appearance:  Casual; Fairly Groomed  Eye Contact: Fair  Speech: Clear and Coherent  Speech Volume: Normal  Handedness: Right  Mood and Affect  Mood: Anxious; Depressed  Affect: Congruent  Thought Process  Thought Processes: Linear  Descriptions of Associations:Intact  Orientation:Full (Time, Place and Person)  Thought Content:WDL  History of Schizophrenia/Schizoaffective disorder:No data recorded Duration of Psychotic Symptoms:No data recorded Hallucinations:Hallucinations: None  Ideas of Reference:None  Suicidal Thoughts:Suicidal Thoughts: No  Homicidal Thoughts:Homicidal Thoughts: No  Sensorium  Memory: Immediate Fair; Recent Fair  Judgment: Fair  Insight: Fair  Art therapist  Concentration: Fair  Attention Span: Fair  Recall: [employer] of Knowledge: Fair  Language: Fair  Psychomotor Activity  Psychomotor Activity: Psychomotor Activity: Normal  Assets  Assets: Communication Skills; Desire for Improvement; Housing; Physical Health; Resilience; Social Support  Sleep  Sleep: Sleep: Good Number of Hours of Sleep: 8.25  Physical  Exam: Physical Exam Vitals and nursing note reviewed.  HENT:     Head: Normocephalic.     Nose: Nose normal.     Mouth/Throat:     Mouth: Mucous membranes are moist.  Eyes:     Extraocular Movements: Extraocular movements intact.  Cardiovascular:     Rate and Rhythm: Tachycardia present.  Pulmonary:     Effort: Pulmonary effort is normal.  Abdominal:     Comments: Deferred  Genitourinary:    Comments: Deferred Musculoskeletal:        General: Normal range of motion.     Cervical back: Normal range of motion.  Skin:    General: Skin is warm.  Neurological:     General: No focal deficit present.     Mental Status: She is alert and oriented to person, place, and time.  Psychiatric:        Mood and Affect: Mood normal.        Behavior:  Behavior normal.        Thought Content: Thought content normal.    Review of Systems  Constitutional:  Negative for chills and fever.  HENT:  Negative for sore throat.   Eyes:  Negative for blurred vision.  Respiratory:  Negative for cough, shortness of breath and wheezing.   Cardiovascular:  Negative for chest pain and palpitations.  Gastrointestinal:  Negative for abdominal pain, heartburn, nausea and vomiting.  Genitourinary:  Negative for dysuria, frequency and urgency.  Musculoskeletal:  Negative for back pain, joint pain, myalgias and neck pain.  Skin:  Negative for itching and rash.  Neurological:  Negative for dizziness, tingling, tremors and headaches.  Endo/Heme/Allergies:        See allergy listing  Psychiatric/Behavioral:  Positive for depression. The patient is nervous/anxious.    Blood pressure 99/73, pulse (!) 108, temperature 98.9 F (37.2 C), temperature source Oral, resp. rate 16, height 5\' 3"  (1.6 m), weight 74.5 kg, last menstrual period 02/16/2023, SpO2 100%. Body mass index is 29.09 kg/m.  Treatment Plan Summary: Daily contact with patient to assess and evaluate symptoms and progress in treatment and Medication  management Assessment: Bipolar disorder recurrent episode depressed Suicide attempt via OD GAD H/o SA via OD in 2018   Plans: Medications: Increase Abilify tablet from 5 mg to 7.5 mg p.o. daily for mood stabilization x 2 days, then increase to 10 mg p.o. daily starting 03/28/2023 Continue hydroxyzine tablets 25 mg p.o. 3 times daily as needed for anxiety Continue trazodone tablets 50 mg p.o. at bedtime as needed for insomnia Continue home med colecalciferol 25 mg 1 tablet 1000 unit total p.o. daily for vitamin D deficiency Completed potassium chloride 40 mEq p.o. x 1 dose only for K+ 3.3.  03-25-23: K+ level 3.8  Agitation protocol: Risperdal M-tabs disintegrating tablets 2 mg p.o. every 8 hours as needed agitation and Geodon injection 20 mg IM as needed x 1 dose and Lorazepam tablet 1 mg p.o. as needed agitation x 1 dose only   Other PRN Medications -Acetaminophen 650 mg every 6 as needed/mild pain -Maalox 30 mL oral every 4 as needed/digestion -Magnesium hydroxide 30 mL daily as needed/mild constipation   -- The risks/benefits/side-effects/alternatives to this medication were discussed in detail with the patient and time was given for questions. The patient consents to medication trial.  -- Metabolic profile and EKG monitoring obtained while on an atypical antipsychotic (BMI: Lipid Panel: HbgA1c: QTc:)  -- Encouraged patient to participate in unit milieu and in scheduled group therapies    Abnormal Labs reviewed: CMP: K+ 3.3 replace with potassium chloride 40 mEq x 1 dose only, Ca +8.5, creatinine 1.02, vitamin D 25-hydroxy 16.72.  BAL less than 10.  UDS: None dictated   Labs ordered: Hemoglobin A1c: 5.1 normal, lipid panel: HDL 40 low, LDL 127 high,  TSH: 0.775 normal.  Awaiting results of BMP plan potassium 3.8 WNL, CO2 21, sodium 133 low.  BMP in 2 days, 03/27/2023          EKG reviewed: NSR with sinus arrhythmia, ventricular rate 62, QT/QTc 432/438   Safety and  Monitoring: Voluntary admission to inpatient psychiatric unit for safety, stabilization and treatment Daily contact with patient to assess and evaluate symptoms and progress in treatment Patient's case to be discussed in multi-disciplinary team meeting Observation Level : q15 minute checks Vital signs: q12 hours Precautions: suicide, but pt currently verbally contracts for safety on unit    Discharge Planning: Social work and case management to  assist with discharge planning and identification of hospital follow-up needs prior to discharge Estimated LOS: 5-7 days Discharge Concerns: Need to establish a safety plan; Medication compliance and effectiveness Discharge Goals: Return home with outpatient referrals for mental health follow-up including medication management/psychotherapy.   Physician Treatment Plan for Primary Diagnosis: Bipolar disorder (HCC) Long Term Goal(s): Improvement in symptoms so as ready for discharge   Short Term Goals: Ability to identify changes in lifestyle to reduce recurrence of condition will improve, Ability to verbalize feelings will improve, Ability to disclose and discuss suicidal ideas, Ability to demonstrate self-control will improve, Ability to identify and develop effective coping behaviors will improve, Ability to maintain clinical measurements within normal limits will improve, Compliance with prescribed medications will improve, and Ability to identify triggers associated with substance abuse/mental health issues will improve   Physician Treatment Plan for Secondary Diagnosis:    Principal Problem:   Bipolar disorder (HCC)   I certify that inpatient services furnished can reasonably be expected to improve the patient's condition.      Cecilie Lowers, FNP 03/25/2023, 4:50 PM Patient ID: [name], female   DOB: 1996/06/02, 27 y.o.   MRN: [id number]

## 2023-03-25 NOTE — Plan of Care (Signed)
  Problem: Education: Goal: Mental status will improve Outcome: Progressing   Problem: Physical Regulation: Goal: Ability to maintain clinical measurements within normal limits will improve Outcome: Progressing   Problem: Safety: Goal: Periods of time without injury will increase Outcome: Progressing

## 2023-03-25 NOTE — Progress Notes (Signed)
   03/25/23 2120  Psych Admission Type (Psych Patients Only)  Admission Status Voluntary  Psychosocial Assessment  Patient Complaints None  Eye Contact Fair  Facial Expression Flat  Affect Flat  Speech Soft  Interaction Minimal  Motor Activity Slow  Appearance/Hygiene Unremarkable  Behavior Characteristics Guarded  Mood Sad  Thought Process  Coherency WDL  Content WDL  Delusions None reported or observed  Perception WDL  Hallucination None reported or observed  Judgment Impaired  Confusion None  Danger to Self  Current suicidal ideation? Denies  Agreement Not to Harm Self Yes  Description of Agreement Verbal  Danger to Others  Danger to Others None reported or observed

## 2023-03-26 DIAGNOSIS — F314 Bipolar disorder, current episode depressed, severe, without psychotic features: Secondary | ICD-10-CM | POA: Diagnosis not present

## 2023-03-26 NOTE — Group Note (Signed)
Date:  03/26/2023 Time:  9:13 AM  Group Topic/Focus:  Goals Group:   The focus of this group is to help patients establish daily goals to achieve during treatment and discuss how the patient can incorporate goal setting into their daily lives to aide in recovery.    Participation Level:  Did Not Attend  Participation Quality:   NA  Affect:   NA  Cognitive:   NA  Insight: None  Engagement in Group:   NA  Modes of Intervention:   NA  Additional Comments:    [name] 03/26/2023, 9:13 AM

## 2023-03-26 NOTE — Progress Notes (Signed)
Deanna [name], has been isolating in her room. She is not attending groups or interacitng with staff or peers.She did attend a dietary group this am. She is guarded and when responding sometimes her eyes remain closed as to ignore people talking to her. Denies SI/HI/AVH Vital signs stable and she voices no complaints currently.

## 2023-03-26 NOTE — Progress Notes (Signed)
Patient informed about room lockout during groups and prompted to attend nutrition group. Pt was compliant and observed attending nutrition group this morning.

## 2023-03-26 NOTE — Progress Notes (Signed)
   03/26/23 2201  Psych Admission Type (Psych Patients Only)  Admission Status Voluntary  Psychosocial Assessment  Patient Complaints None  Eye Contact Fair  Facial Expression Flat  Affect Depressed;Flat  Speech Logical/coherent;Soft  Interaction Isolative;Minimal  Motor Activity Slow  Appearance/Hygiene Disheveled  Behavior Characteristics Cooperative;Appropriate to situation  Mood Depressed  Aggressive Behavior  Effect No apparent injury  Thought Process  Coherency WDL  Content WDL  Delusions None reported or observed  Perception WDL  Hallucination None reported or observed  Judgment Impaired  Confusion None  Danger to Self  Current suicidal ideation? Denies  Agreement Not to Harm Self Yes  Description of Agreement verbal  Danger to Others  Danger to Others None reported or observed

## 2023-03-26 NOTE — Plan of Care (Signed)
  Problem: Education: Goal: Knowledge of [facility] Education information/materials will improve Outcome: Progressing   Problem: Education: Goal: Emotional status will improve Outcome: Not Progressing Goal: Mental status will improve Outcome: Not Progressing Goal: Verbalization of understanding the information provided will improve Outcome: Not Progressing   

## 2023-03-26 NOTE — Group Note (Signed)
LCSW Group Therapy Note   Group Date: 03/26/2023 Start Time: 1100 End Time: 1200   Type of Therapy and Topic:  Group Therapy: Boundaries  Participation Level:  None  Description of Group: This group will address the use of boundaries in their personal lives. Patients will explore why boundaries are important, the difference between healthy and unhealthy boundaries, and negative and postive outcomes of different boundaries and will look at how boundaries can be crossed.  Patients will be encouraged to identify current boundaries in their own lives and identify what kind of boundary is being set. Facilitators will guide patients in utilizing problem-solving interventions to address and correct types boundaries being used and to address when no boundary is being used. Understanding and applying boundaries will be explored and addressed for obtaining and maintaining a balanced life. Patients will be encouraged to explore ways to assertively make their boundaries and needs known to significant others in their lives, using other group members and facilitator for role play, support, and feedback.  Therapeutic Goals:  1.  Patient will identify areas in their life where setting clear boundaries could be  used to improve their life.  2.  Patient will identify signs/triggers that a boundary is not being respected. 3.  Patient will identify two ways to set boundaries in order to achieve balance in  their lives: 4.  Patient will demonstrate ability to communicate their needs and set boundaries  through discussion and/or role plays  Summary of Patient Progress:  Patient did not provide any insight  throughout the session and proved open to feedback from CSW and peers. Patient demonstrated no insight into the subject matter, was respectful of peers, and was present throughout the entire session.  Therapeutic Modalities:   Cognitive Behavioral Therapy Solution-Focused Therapy  Marinda Elk,  [place] 03/26/2023  2:59 PM

## 2023-03-26 NOTE — [facility] Group Notes (Signed)
[facility] Group Notes:  (Nursing/MHT/Case Management/Adjunct)  Date:  03/26/2023  Time:  9:18 PM  Type of Therapy:  The focus of this group is to help patients establish daily goals to achieve during treatment and discuss how the patient can incorporate goal setting into their daily lives to aide in recovery.   Participation Level:  Active  Participation Quality:  Appropriate, Sharing, and Supportive  Affect:  Appropriate  Cognitive:  Alert and Appropriate  Insight:  Appropriate, Good, and Improving  Engagement in Group:  Engaged and Supportive  Modes of Intervention:  Socialization and Support  Summary of Progress/Problems: Pt attended group  Deanna [name] 03/26/2023, 9:18 PM

## 2023-03-26 NOTE — Progress Notes (Addendum)
[facility] MD Progress Note  03/26/2023 12:43 PM Deanna [name]  MRN:  [id number]  Subjective:   Deanna [name] is a 27 year old AA female with prior psychiatric history significant for bipolar disorder depressed [name], Deanna [name], Deanna [name] the context of break-up with her significant other. After medical evaluation/stabilization & clearance, she was transferred to the [facility] for further psychiatric evaluation & treatments.   Yesterday the psychiatry team made the following recommendations: Increase Abilify tablet from 5 mg to 7.5 mg p.o. daily for mood stabilization x 2 days, then increase to 10 mg p.o. daily starting 03/28/2023   On assessment today, the pt reports that their mood is "just fine". Pt appears depressed, flat. Pt has consistently minimized symptoms during admission and prior to admission, as evidenced by collateral information. Pt reports everything is fine and she needs no treatment. Collateral confirms that pt was very depressed, did attempt to kill self, even writing a Deanna note. Although pt only says she took 1-2 tylenol and ibuprofen.  Reports that anxiety is not elevated.  Sleep is okay. Appetite is okay. Concentration is okay.  Energy level is okay. Denies having any suicidal thoughts. Denies any suicidal intent and plan.  Denies having any HI.  Denies having psychotic symptoms.   Denies having side effects to current psychiatric medications.   Discussed the following psychosocial stressors: conflict/breakup with girl friend       Principal Problem: Bipolar disorder (HCC) Diagnosis: Principal Problem:   Bipolar disorder (HCC)  Total Time spent with patient: 15 minutes  Past Psychiatric History:  Previous Psych Diagnoses: Bipolar disorder, SI with attempted self injury. Prior inpatient treatment: Yes, [name]  2018 at the [facility] Current/prior outpatient treatment: Denies Prior rehab hx: Denies Psychotherapy hx: Yes History of Deanna: Yes x 1 [name] 2018 History of homicide or aggression: Denies Psychiatric [name] history: Yes, patient has prior history of Remeron, Lamictal, and hydroxyzine Psychiatric [name] compliance history: Yes, patient reports compliance Neuromodulation history: Denies Current Psychiatrist: Denies current psychiatrist Current therapist: Denies current therapist    Past Medical History:  Past Medical History:  Diagnosis Date   Allergy    Anxiety    Asthma    Depression    Eczema    Ovarian cyst    resolved    Past Surgical History:  Procedure Laterality Date   OVARIAN CYST REMOVAL     Family History:  Family History  Problem Relation Age of Onset   Asthma Mother    Bipolar disorder Mother    Asthma Father    Hypertension Father    Bipolar disorder Father    Hypertension Paternal Grandmother    Diabetes Paternal Grandmother    Asthma Paternal Grandmother    Family Psychiatric  History: See H&P   Social History:  Social History   Substance and Sexual Activity  Alcohol Use No     Social History   Substance and Sexual Activity  Drug Use No    Social History   Socioeconomic History   Marital status: Significant Other    Spouse name: Not on file   Number of children: Not on file   Years of education: Not on file   Highest education level: Not on file  Occupational History   Not on file  Tobacco Use   Smoking status: Every Day    Current packs/day:  0.50    Types: Cigarettes   Smokeless tobacco: Never  Vaping Use   Vaping status: Every Day  Substance and Sexual Activity   Alcohol use: No   Drug use: No   Sexual activity: Yes    Partners: Female    Birth control/protection: None  Other Topics Concern   Not on file  Social History Narrative   Not on file   Social Determinants of Health   Financial Resource Strain: Not on  file  Food Insecurity: Patient Declined (03/22/2023)   Hunger Vital Sign    Worried About Running Out of Food [name] the Last Year: Patient declined    Ran Out of Food [name] the Last Year: Patient declined  Transportation Needs: Patient Declined (03/22/2023)   PRAPARE - Administrator, Civil Service (Medical): Patient declined    Lack of Transportation (Non-Medical): Patient declined  Physical Activity: Not on file  Stress: Not on file  Social Connections: Not on file   Additional Social History:                           Current Medications: Current Facility-Administered Medications  [name] Dose Route Frequency Provider Last Rate Last Admin   acetaminophen (TYLENOL) tablet 650 mg  650 mg Oral Q6H PRN Ntuen, Jesusita Oka, FNP       alum & mag hydroxide-simeth (MAALOX/MYLANTA) 200-200-20 MG/5ML suspension 30 mL  30 mL Oral Q4H PRN Ntuen, Jesusita Oka, FNP       [START ON 03/28/2023] ARIPiprazole (ABILIFY) tablet 10 mg  10 mg Oral Daily Ntuen, Jesusita Oka, FNP       ARIPiprazole (ABILIFY) tablet 7.5 mg  7.5 mg Oral Daily Ntuen, Jesusita Oka, FNP   7.5 mg at 03/26/23 0817   haloperidol (HALDOL) tablet 5 mg  5 mg Oral TID PRN Phineas Inches, MD       And   LORazepam (ATIVAN) tablet 2 mg  2 mg Oral TID PRN Phineas Inches, MD       And   diphenhydrAMINE (BENADRYL) capsule 50 mg  50 mg Oral TID PRN Celvin Taney, Harrold Donath, MD       haloperidol lactate (HALDOL) injection 5 mg  5 mg Intramuscular TID PRN Tanner Vigna, Harrold Donath, MD       And   LORazepam (ATIVAN) injection 2 mg  2 mg Intramuscular TID PRN Amilyah Nack, Harrold Donath, MD       And   diphenhydrAMINE (BENADRYL) injection 50 mg  50 mg Intramuscular TID PRN Dailon Sheeran, Harrold Donath, MD       hydrOXYzine (ATARAX) tablet 25 mg  25 mg Oral TID PRN Phineas Inches, MD   25 mg at 03/25/23 2113   magnesium hydroxide (MILK OF MAGNESIA) suspension 30 mL  30 mL Oral Daily PRN Ntuen, Jesusita Oka, FNP       traZODone (DESYREL) tablet 50 mg  50 mg Oral QHS PRN  Vash Quezada, Harrold Donath, MD   50 mg at 03/25/23 2113   triamcinolone ointment (KENALOG) 0.5 %   Topical BID Cecilie Lowers, FNP   Given at 03/26/23 0820   vitamin D3 (CHOLECALCIFEROL) tablet 1,000 Units  1,000 Units Oral Daily Cecilie Lowers, FNP   1,000 Units at 03/26/23 6045    Lab Results:  Results for orders placed or performed during the hospital encounter of 03/22/23 (from the past 48 hour(s))  Basic metabolic panel     Status: Abnormal   Collection Time: 03/24/23  6:37 PM  Result  Value Ref Range   Sodium 133 (L) 135 - 145 mmol/L   Potassium 3.8 3.5 - 5.1 mmol/L   Chloride 100 98 - 111 mmol/L   CO2 21 (L) 22 - 32 mmol/L   Glucose, Bld 118 (H) 70 - 99 mg/dL    Comment: Glucose reference range applies only to samples taken after fasting for at least 8 hours.   BUN 13 6 - 20 mg/dL   Creatinine, Ser 7.82 0.44 - 1.00 mg/dL   Calcium 9.5 8.9 - 95.6 mg/dL   GFR, Estimated >21 >30 mL/min    Comment: (NOTE) Calculated using the CKD-EPI Creatinine Equation (2021)    Anion gap 12 5 - 15    Comment: Performed at [facility], [address] [place], [place] [address]    Blood Alcohol level:  Lab Results  Component Value Date   [facility] <10 03/19/2023   ETH <5 06/01/2016    Metabolic Disorder Labs: Lab Results  Component Value Date   HGBA1C 5.1 03/24/2023   MPG 99.67 03/24/2023   No results found for: "PROLACTIN" Lab Results  Component Value Date   CHOL 188 03/24/2023   TRIG 107 03/24/2023   HDL 40 (L) 03/24/2023   CHOLHDL 4.7 03/24/2023   VLDL 21 03/24/2023   LDLCALC 127 (H) 03/24/2023    Physical Findings: AIMS:  , ,  ,  ,    CIWA:    COWS:       Psychiatric Specialty Exam:  Presentation  General Appearance:  Casual  Eye Contact: Fair  Speech: Slow  Speech Volume: Decreased  Handedness: Right   Mood and Affect  Mood: Anxious; Depressed  Affect: Constricted; Depressed   Thought Process  Thought Processes: Linear  Descriptions  of Associations:Intact  Orientation:Full (Time, Place and Person)  Thought Content:Logical  History of Schizophrenia/Schizoaffective disorder:No data recorded Duration of Psychotic Symptoms:No data recorded Hallucinations:Hallucinations: None  Ideas of Reference:None  Suicidal Thoughts:Suicidal Thoughts: No  Homicidal Thoughts:Homicidal Thoughts: No   Sensorium  Memory: Immediate Fair; Recent Fair; Remote Fair  Judgment: Impaired  Insight: Lacking   Executive Functions  Concentration: Fair  Attention Span: Fair  Recall: [employer] of Knowledge: Fair  Language: Fair   Psychomotor Activity  Psychomotor Activity: Psychomotor Activity: Normal   Assets  Assets: Communication Skills; Desire for Improvement; Housing; Physical Health; Resilience; Social Support   Sleep  Sleep: Sleep: Fair Number of Hours of Sleep: 8.25    Physical Exam: Physical Exam Vitals reviewed.  Pulmonary:     Effort: Pulmonary effort is normal.  Neurological:     Mental Status: She is alert.     Motor: No weakness.     Gait: Gait normal.  Psychiatric:        Behavior: Behavior normal.    Review of Systems  Constitutional:  Negative for chills and fever.  Cardiovascular:  Negative for chest pain and palpitations.  Neurological:  Negative for dizziness, tingling, tremors and headaches.  Psychiatric/Behavioral:  Positive for depression and substance abuse.   All other systems reviewed and are negative.  Blood pressure 101/67, pulse 96, temperature 98.9 F (37.2 C), temperature source Oral, resp. rate 16, height 5\' 3"  (1.6 m), weight 74.5 kg, SpO2 100%. Body mass index is 29.09 kg/m.   Treatment Plan Summary: Daily contact with patient to assess and evaluate symptoms and progress [name] treatment and [name] management   Assessment: Bipolar disorder recurrent episode depressed Deanna attempt via OD Deanna H/o SA via OD [name] 2018  Plans: Medications: -Continue  Abilify 7.5 mg p.o. daily for mood stabilization for 1 more day, then  increase to 10 mg p.o. daily starting 03/28/2023 -Continue hydroxyzine tablets 25 mg p.o. 3 times daily as needed for anxiety -Continue trazodone tablets 50 mg p.o. at bedtime as needed for insomnia -Continue home med colecalciferol 25 mg 1 tablet 1000 unit total p.o. daily for vitamin D deficiency -Completed potassium chloride 40 mEq p.o. x 1 dose only for K+ 3.3.  03-25-23: K+ level 3.8   Agitation protocol: Risperdal M-tabs disintegrating tablets 2 mg p.o. every 8 hours as needed agitation and Geodon injection 20 mg IM as needed x 1 dose and Lorazepam tablet 1 mg p.o. as needed agitation x 1 dose only   Other PRN Medications -Acetaminophen 650 mg every 6 as needed/mild pain -Maalox 30 mL oral every 4 as needed/digestion -Magnesium hydroxide 30 mL daily as needed/mild constipation   -- The risks/benefits/side-effects/alternatives to this [name] were discussed [name] detail with the patient and time was given for questions. The patient consents to [name] trial.  -- Metabolic profile and EKG monitoring obtained while on an atypical antipsychotic (BMI: Lipid Panel: HbgA1c: QTc:)  -- Encouraged patient to participate [name] unit milieu and [name] scheduled group therapies    Abnormal Labs reviewed: CMP: K+ 3.3 replace with potassium chloride 40 mEq x 1 dose only, Ca +8.5, creatinine 1.02, vitamin D 25-hydroxy 16.72.  BAL less than 10.  UDS: None dictated   Labs ordered: Hemoglobin A1c: 5.1 normal, lipid panel: HDL 40 low, LDL 127 high,  TSH: 0.775 normal.  Awaiting results of BMP plan potassium 3.8 WNL, CO2 21, sodium 133 low.  BMP [name] 2 days, 03/27/2023          EKG reviewed: NSR with sinus arrhythmia, ventricular rate 62, QT/QTc 432/438   Safety and Monitoring: Voluntary admission to inpatient psychiatric unit for safety, stabilization and treatment Daily contact with patient to assess and evaluate symptoms and progress [name]  treatment Patient's case to be discussed [name] multi-disciplinary team meeting Observation Level : q15 minute checks Vital signs: q12 hours Precautions: Deanna, but pt currently verbally contracts for safety on unit    Discharge Planning: Social work and case management to assist with discharge planning and identification of hospital follow-up needs prior to discharge Estimated LOS: 3-4 more days Discharge Concerns: Need to establish a safety plan; [name] compliance and effectiveness Discharge Goals: Return home with outpatient referrals for mental health follow-up including [name] management/psychotherapy.   Cristy Hilts, MD 03/26/2023, 12:43 PM  Total Time Spent [name] Direct Patient Care:  I personally spent 25 minutes on the unit [name] direct patient care. The direct patient care time included face-to-face time with the patient, reviewing the patient's chart, communicating with other professionals, and coordinating care. Greater than 50% of this time was spent [name] counseling or coordinating care with the patient regarding goals of hospitalization, psycho-education, and discharge planning needs.   Phineas Inches, MD Psychiatrist

## 2023-03-27 ENCOUNTER — Encounter (HOSPITAL_COMMUNITY): Payer: Self-pay

## 2023-03-27 DIAGNOSIS — F314 Bipolar disorder, current episode depressed, severe, without psychotic features: Secondary | ICD-10-CM | POA: Diagnosis not present

## 2023-03-27 LAB — BASIC METABOLIC PANEL
Anion gap: 10 (ref 5–15)
BUN: 10 mg/dL (ref 6–20)
CO2: 23 mmol/L (ref 22–32)
Calcium: 9.3 mg/dL (ref 8.9–10.3)
Chloride: 103 mmol/L (ref 98–111)
Creatinine, Ser: 1.08 mg/dL — ABNORMAL HIGH (ref 0.44–1.00)
GFR, Estimated: >60 mL/min (ref >60)
Glucose, Bld: 99 mg/dL (ref 70–99)
Potassium: 4.1 mmol/L (ref 3.5–5.1)
Sodium: 136 mmol/L (ref 135–145)

## 2023-03-27 NOTE — Progress Notes (Signed)
[facility] MD Progress Note  03/27/2023 11:57 AM Deanna [name]  MRN:  [id number]  Subjective:   Deanna [name] is a 27 year old AA female with prior psychiatric history significant for bipolar disorder depressed type, suicide attempts with OD on medication, GAD who presents voluntarily to [facility] from [facility] health ED at [facility] for overdose with unresponsiveness with unknown substance in the context of break-up with her significant other. After medical evaluation/stabilization & clearance, she was transferred to the [facility] for further psychiatric evaluation & treatments.     On assessment today, the pt reports that their mood is "just fine". Pt continues to appear depressed, flat. Pt continues to consistently minimized symptoms during admission and prior to admission, as evidenced by collateral information. Pt reports everything is fine and she needs no treatment.  Collateral confirms that pt was very depressed, did attempt to kill self, even writing a suicide note. Although pt only says she took 1-2 tylenol and ibuprofen.  Reports that anxiety is unchanged since yesterday, not elevated Sleep is okay.  Patient in bed for most of the day, room lockout order started on Friday, during meals and group to encourage patient to dissipate in the therapeutic milieu. Appetite is okay. Concentration is okay.  Energy level is okay. Denies having any suicidal thoughts. Denies any suicidal intent and plan.  Denies having any HI.  Denies having psychotic symptoms.   Denies having side effects to current psychiatric medications.   Discussed the following psychosocial stressors: conflict/breakup with girl friend    We discussed, that it would be beneficial for the provider team, in order to assess her response to therapeutic treatment, if the patient would have a visitor such as a close family or friend, they could spend some time with her, and the treatment team could follow up with that person we will  determine the response to treatment during this hospitalization, and also to help determine suicide risk leading up to discharge planning.  We discussed that discharge would likely be next week.  Patient says that she will reach out to someone close to her, to visit over the weekend.   Principal Problem: Bipolar disorder (HCC) Diagnosis: Principal Problem:   Bipolar disorder (HCC)  Total Time spent with patient: 15 minutes  Past Psychiatric History:  Previous Psych Diagnoses: Bipolar disorder, SI with attempted self injury. Prior inpatient treatment: Yes, in 2018 at the [facility] Current/prior outpatient treatment: Denies Prior rehab hx: Denies Psychotherapy hx: Yes History of suicide: Yes x 1 in 2018 History of homicide or aggression: Denies Psychiatric medication history: Yes, patient has prior history of Remeron, Lamictal, and hydroxyzine Psychiatric medication compliance history: Yes, patient reports compliance Neuromodulation history: Denies Current Psychiatrist: Denies current psychiatrist Current therapist: Denies current therapist    Past Medical History:  Past Medical History:  Diagnosis Date   Allergy    Anxiety    Asthma    Depression    Eczema    Ovarian cyst    resolved    Past Surgical History:  Procedure Laterality Date   OVARIAN CYST REMOVAL     Family History:  Family History  Problem Relation Age of Onset   Asthma Mother    Bipolar disorder Mother    Asthma Father    Hypertension Father    Bipolar disorder Father    Hypertension Paternal Grandmother    Diabetes Paternal Grandmother    Asthma Paternal Grandmother    Family Psychiatric  History: See H&P  Social History:  Social History   Substance and Sexual Activity  Alcohol Use No     Social History   Substance and Sexual Activity  Drug Use No    Social History   Socioeconomic History   Marital status: Significant Other    Spouse name: Not on file   Number of children: Not on file    Years of education: Not on file   Highest education level: Not on file  Occupational History   Not on file  Tobacco Use   Smoking status: Every Day    Current packs/day: 0.50    Types: Cigarettes   Smokeless tobacco: Never  Vaping Use   Vaping status: Every Day  Substance and Sexual Activity   Alcohol use: No   Drug use: No   Sexual activity: Yes    Partners: Female    Birth control/protection: None  Other Topics Concern   Not on file  Social History Narrative   Not on file   Social Determinants of Health   Financial Resource Strain: Not on file  Food Insecurity: Patient Declined (03/22/2023)   Hunger Vital Sign    Worried About Running Out of Food in the Last Year: Patient declined    Ran Out of Food in the Last Year: Patient declined  Transportation Needs: Patient Declined (03/22/2023)   PRAPARE - Administrator, Civil Service (Medical): Patient declined    Lack of Transportation (Non-Medical): Patient declined  Physical Activity: Not on file  Stress: Not on file  Social Connections: Not on file   Additional Social History:                           Current Medications: Current Facility-Administered Medications  Medication Dose Route Frequency Provider Last Rate Last Admin   acetaminophen (TYLENOL) tablet 650 mg  650 mg Oral Q6H PRN Ntuen, Jesusita Oka, FNP       alum & mag hydroxide-simeth (MAALOX/MYLANTA) 200-200-20 MG/5ML suspension 30 mL  30 mL Oral Q4H PRN Ntuen, Jesusita Oka, FNP       [START ON 03/28/2023] ARIPiprazole (ABILIFY) tablet 10 mg  10 mg Oral Daily Ntuen, Jesusita Oka, FNP       haloperidol (HALDOL) tablet 5 mg  5 mg Oral TID PRN Samanyu Tinnell, Harrold Donath, MD       And   LORazepam (ATIVAN) tablet 2 mg  2 mg Oral TID PRN Mikah Poss, Harrold Donath, MD       And   diphenhydrAMINE (BENADRYL) capsule 50 mg  50 mg Oral TID PRN Savian Mazon, Harrold Donath, MD       haloperidol lactate (HALDOL) injection 5 mg  5 mg Intramuscular TID PRN Lizzett Nobile, Harrold Donath, MD       And    LORazepam (ATIVAN) injection 2 mg  2 mg Intramuscular TID PRN Fidel Caggiano, Harrold Donath, MD       And   diphenhydrAMINE (BENADRYL) injection 50 mg  50 mg Intramuscular TID PRN Swati Granberry, Harrold Donath, MD       hydrOXYzine (ATARAX) tablet 25 mg  25 mg Oral TID PRN Phineas Inches, MD   25 mg at 03/26/23 2155   magnesium hydroxide (MILK OF MAGNESIA) suspension 30 mL  30 mL Oral Daily PRN Ntuen, Jesusita Oka, FNP       traZODone (DESYREL) tablet 50 mg  50 mg Oral QHS PRN Alisse Tuite, Harrold Donath, MD   50 mg at 03/26/23 2155   triamcinolone ointment (KENALOG) 0.5 %   Topical BID  Cecilie Lowers, FNP   Given at 03/27/23 2595   vitamin D3 (CHOLECALCIFEROL) tablet 1,000 Units  1,000 Units Oral Daily Cecilie Lowers, FNP   1,000 Units at 03/27/23 6387    Lab Results:  No results found for this or any previous visit (from the past 48 hour(s)).   Blood Alcohol level:  Lab Results  Component Value Date   ETH <10 03/19/2023   ETH <5 06/01/2016    Metabolic Disorder Labs: Lab Results  Component Value Date   HGBA1C 5.1 03/24/2023   MPG 99.67 03/24/2023   No results found for: "PROLACTIN" Lab Results  Component Value Date   CHOL 188 03/24/2023   TRIG 107 03/24/2023   HDL 40 (L) 03/24/2023   CHOLHDL 4.7 03/24/2023   VLDL 21 03/24/2023   LDLCALC 127 (H) 03/24/2023    Physical Findings: AIMS:  , ,  ,  ,    CIWA:    COWS:       Psychiatric Specialty Exam:  Presentation  General Appearance:  Casual  Eye Contact: Poor  Speech: Slow  Speech Volume: Decreased  Handedness: Right   Mood and Affect  Mood: Depressed  Affect: Depressed; Flat   Thought Process  Thought Processes: Linear  Descriptions of Associations:Intact  Orientation:Full (Time, Place and Person)  Thought Content:Logical  History of Schizophrenia/Schizoaffective disorder:No data recorded Duration of Psychotic Symptoms:No data recorded Hallucinations:Hallucinations: None  Ideas of Reference:None  Suicidal  Thoughts:Suicidal Thoughts: No  Homicidal Thoughts:Homicidal Thoughts: No   Sensorium  Memory: Immediate Fair; Recent Fair; Remote Fair  Judgment: Fair  Insight: Lacking   Executive Functions  Concentration: Fair  Attention Span: Fair  Recall: [employer] of Knowledge: Fair  Language: Fair   Psychomotor Activity  Psychomotor Activity: Psychomotor Activity: Normal   Assets  Assets: Communication Skills; Desire for Improvement; Housing; Physical Health; Resilience; Social Support   Sleep  Sleep: Sleep: Fair    Physical Exam: Physical Exam Vitals reviewed.  Pulmonary:     Effort: Pulmonary effort is normal.  Neurological:     Mental Status: She is alert.     Motor: No weakness.     Gait: Gait normal.  Psychiatric:        Behavior: Behavior normal.        Judgment: Judgment normal.    Review of Systems  Constitutional:  Negative for chills and fever.  Cardiovascular:  Negative for chest pain and palpitations.  Neurological:  Negative for dizziness, tingling, tremors and headaches.  Psychiatric/Behavioral:  Positive for depression, substance abuse and suicidal ideas. Negative for hallucinations and memory loss. The patient is nervous/anxious. The patient does not have insomnia.   All other systems reviewed and are negative.  Blood pressure (!) 94/59, pulse (!) 113, temperature 98.5 F (36.9 C), temperature source Oral, resp. rate 16, height 5\' 3"  (1.6 m), weight 74.5 kg, SpO2 99%. Body mass index is 29.09 kg/m.   Treatment Plan Summary: Daily contact with patient to assess and evaluate symptoms and progress in treatment and Medication management   Assessment: Bipolar disorder recurrent episode depressed Suicide attempt via OD GAD H/o SA via OD in 2018   Plans: Medications: -Increase Abilify from 7.5 mg to 10 mg once  daily for bipolar depression -Continue hydroxyzine tablets 25 mg p.o. 3 times daily as needed for anxiety -Continue  trazodone tablets 50 mg p.o. at bedtime as needed for insomnia -Continue home med colecalciferol 25 mg 1 tablet 1000 unit total p.o. daily for vitamin D  deficiency -Completed potassium chloride 40 mEq p.o. x 1 dose only for K+ 3.3.  03-25-23: K+ level 3.8   Agitation protocol: Risperdal M-tabs disintegrating tablets 2 mg p.o. every 8 hours as needed agitation and Geodon injection 20 mg IM as needed x 1 dose and Lorazepam tablet 1 mg p.o. as needed agitation x 1 dose only   Other PRN Medications -Acetaminophen 650 mg every 6 as needed/mild pain -Maalox 30 mL oral every 4 as needed/digestion -Magnesium hydroxide 30 mL daily as needed/mild constipation   -- The risks/benefits/side-effects/alternatives to this medication were discussed in detail with the patient and time was given for questions. The patient consents to medication trial.  -- Metabolic profile and EKG monitoring obtained while on an atypical antipsychotic (BMI: Lipid Panel: HbgA1c: QTc:)  -- Encouraged patient to participate in unit milieu and in scheduled group therapies    Abnormal Labs reviewed: CMP: K+ 3.3 replace with potassium chloride 40 mEq x 1 dose only, Ca +8.5, creatinine 1.02, vitamin D 25-hydroxy 16.72.  BAL less than 10.  UDS: None dictated   Labs ordered: Hemoglobin A1c: 5.1 normal, lipid panel: HDL 40 low, LDL 127 high,  TSH: 0.775 normal.  Awaiting results of BMP plan potassium 3.8 WNL, CO2 21, sodium 133 low.  BMP in 2 days, 03/27/2023          EKG reviewed: NSR with sinus arrhythmia, ventricular rate 62, QT/QTc 432/438   Safety and Monitoring: Voluntary admission to inpatient psychiatric unit for safety, stabilization and treatment Daily contact with patient to assess and evaluate symptoms and progress in treatment Patient's case to be discussed in multi-disciplinary team meeting Observation Level : q15 minute checks Vital signs: q12 hours Precautions: suicide, but pt currently verbally contracts for safety  on unit    Discharge Planning: Social work and case management to assist with discharge planning and identification of hospital follow-up needs prior to discharge Estimated LOS: 3-4 more days Discharge Concerns: Need to establish a safety plan; Medication compliance and effectiveness Discharge Goals: Return home with outpatient referrals for mental health follow-up including medication management/psychotherapy.   Cristy Hilts, MD 03/27/2023, 11:57 AM  Total Time Spent in Direct Patient Care:  I personally spent 35 minutes on the unit in direct patient care. The direct patient care time included face-to-face time with the patient, reviewing the patient's chart, communicating with other professionals, and coordinating care. Greater than 50% of this time was spent in counseling or coordinating care with the patient regarding goals of hospitalization, psycho-education, and discharge planning needs.   Phineas Inches, MD Psychiatrist

## 2023-03-27 NOTE — BH IP Treatment Plan (Signed)
Interdisciplinary Treatment and Diagnostic Plan Update  03/27/2023 Time of Session: 9:25 AM (UPDATE)  Deanna [name] MRN: [id number]  Principal Diagnosis: Bipolar disorder (HCC)  Secondary Diagnoses: Principal Problem:   Bipolar disorder (HCC)   Current Medications:  Current Facility-Administered Medications  Medication Dose Route Frequency Provider Last Rate Last Admin   acetaminophen (TYLENOL) tablet 650 mg  650 mg Oral Q6H PRN Ntuen, Jesusita Oka, FNP       alum & mag hydroxide-simeth (MAALOX/MYLANTA) 200-200-20 MG/5ML suspension 30 mL  30 mL Oral Q4H PRN Ntuen, Jesusita Oka, FNP       [START ON 03/28/2023] ARIPiprazole (ABILIFY) tablet 10 mg  10 mg Oral Daily Ntuen, Jesusita Oka, FNP       haloperidol (HALDOL) tablet 5 mg  5 mg Oral TID PRN Massengill, Harrold Donath, MD       And   LORazepam (ATIVAN) tablet 2 mg  2 mg Oral TID PRN Massengill, Harrold Donath, MD       And   diphenhydrAMINE (BENADRYL) capsule 50 mg  50 mg Oral TID PRN Massengill, Harrold Donath, MD       haloperidol lactate (HALDOL) injection 5 mg  5 mg Intramuscular TID PRN Massengill, Harrold Donath, MD       And   LORazepam (ATIVAN) injection 2 mg  2 mg Intramuscular TID PRN Massengill, Harrold Donath, MD       And   diphenhydrAMINE (BENADRYL) injection 50 mg  50 mg Intramuscular TID PRN Massengill, Harrold Donath, MD       hydrOXYzine (ATARAX) tablet 25 mg  25 mg Oral TID PRN Phineas Inches, MD   25 mg at 03/26/23 2155   magnesium hydroxide (MILK OF MAGNESIA) suspension 30 mL  30 mL Oral Daily PRN Ntuen, Jesusita Oka, FNP       traZODone (DESYREL) tablet 50 mg  50 mg Oral QHS PRN Massengill, Harrold Donath, MD   50 mg at 03/26/23 2155   triamcinolone ointment (KENALOG) 0.5 %   Topical BID Cecilie Lowers, FNP   Given at 03/27/23 1324   vitamin D3 (CHOLECALCIFEROL) tablet 1,000 Units  1,000 Units Oral Daily Cecilie Lowers, FNP   1,000 Units at 03/27/23 4010   PTA Medications: Medications Prior to Admission  Medication Sig Dispense Refill Last Dose   cholecalciferol (CHOLECALCIFEROL)  25 MCG tablet Take 1 tablet (1,000 Units total) by mouth daily.       Patient Stressors:    Patient Strengths:    Treatment Modalities: Medication Management, Group therapy, Case management,  1 to 1 session with clinician, Psychoeducation, Recreational therapy.   Physician Treatment Plan for Primary Diagnosis: Bipolar disorder (HCC) Long Term Goal(s): Improvement in symptoms so as ready for discharge   Short Term Goals: Ability to identify changes in lifestyle to reduce recurrence of condition will improve Ability to verbalize feelings will improve Ability to disclose and discuss suicidal ideas Ability to demonstrate self-control will improve Ability to identify and develop effective coping behaviors will improve Ability to maintain clinical measurements within normal limits will improve Compliance with prescribed medications will improve Ability to identify triggers associated with substance abuse/mental health issues will improve  Medication Management: Evaluate patient's response, side effects, and tolerance of medication regimen.  Therapeutic Interventions: 1 to 1 sessions, Unit Group sessions and Medication administration.  Evaluation of Outcomes: Progressing  Physician Treatment Plan for Secondary Diagnosis: Principal Problem:   Bipolar disorder (HCC)  Long Term Goal(s): Improvement in symptoms so as ready for discharge   Short Term Goals: Ability to identify  changes in lifestyle to reduce recurrence of condition will improve Ability to verbalize feelings will improve Ability to disclose and discuss suicidal ideas Ability to demonstrate self-control will improve Ability to identify and develop effective coping behaviors will improve Ability to maintain clinical measurements within normal limits will improve Compliance with prescribed medications will improve Ability to identify triggers associated with substance abuse/mental health issues will improve     Medication  Management: Evaluate patient's response, side effects, and tolerance of medication regimen.  Therapeutic Interventions: 1 to 1 sessions, Unit Group sessions and Medication administration.  Evaluation of Outcomes: Progressing   RN Treatment Plan for Primary Diagnosis: Bipolar disorder (HCC) Long Term Goal(s): Knowledge of disease and therapeutic regimen to maintain health will improve  Short Term Goals: Ability to remain free from injury will improve, Ability to verbalize frustration and anger appropriately will improve, Ability to participate in decision making will improve, Ability to verbalize feelings will improve, Ability to identify and develop effective coping behaviors will improve, and Compliance with prescribed medications will improve  Medication Management: RN will administer medications as ordered by provider, will assess and evaluate patient's response and provide education to patient for prescribed medication. RN will report any adverse and/or side effects to prescribing provider.  Therapeutic Interventions: 1 on 1 counseling sessions, Psychoeducation, Medication administration, Evaluate responses to treatment, Monitor vital signs and CBGs as ordered, Perform/monitor CIWA, COWS, AIMS and Fall Risk screenings as ordered, Perform wound care treatments as ordered.  Evaluation of Outcomes: Progressing   LCSW Treatment Plan for Primary Diagnosis: Bipolar disorder (HCC) Long Term Goal(s): Safe transition to appropriate next level of care at discharge, Engage patient in therapeutic group addressing interpersonal concerns.  Short Term Goals: Engage patient in aftercare planning with referrals and resources, Increase social support, Increase emotional regulation, Facilitate acceptance of mental health diagnosis and concerns, Identify triggers associated with mental health/substance abuse issues, and Increase skills for wellness and recovery  Therapeutic Interventions: Assess for all  discharge needs, 1 to 1 time with Social worker, Explore available resources and support systems, Assess for adequacy in community support network, Educate family and significant other(s) on suicide prevention, Complete Psychosocial Assessment, Interpersonal group therapy.  Evaluation of Outcomes: Progressing   Progress in Treatment:  Attending groups: Yes. Participating in groups: Yes. Taking medication as prescribed: Yes. Toleration medication: Yes. Family/Significant other contact made: Yes; Johnette Abraham [phone] [facility] Other) Patient understands diagnosis: Yes. Discussing patient identified problems/goals with staff: Yes. Medical problems stabilized or resolved: Yes. Denies suicidal/homicidal ideation: Yes. Issues/concerns per patient self-inventory: No.   New problem(s) identified: No, Describe:  none reported   New Short Term/Long Term Goal(s):medication stabilization, elimination of SI thoughts, development of comprehensive mental wellness plan.      Patient Goals:  "I don't know"   Discharge Plan or Barriers: Patient recently admitted. CSW will continue to follow and assess for appropriate referrals and possible discharge planning.      Reason for Continuation of Hospitalization: Depression Medication stabilization Suicidal ideation   Estimated Length of Stay: 2-3 days  Last 3 [place] Suicide Severity Risk Score: Flowsheet Row [facility] (Current[facility]-Admission (Discharged) from 03/19/2023 in [place] [facility] from 03/16/2023 in [facility] Department at [facility]  C-SSRS RISK CATEGORY No Risk No Risk No Risk       Last PHQ 2/9 Scores:     No data to display  Scribe for Treatment Team: Isabella Bowens, Theresia Majors 03/27/2023 1:07 PM

## 2023-03-27 NOTE — Group Note (Deleted)
Date:  03/27/2023 Time:  10:41 AM  Group Topic/Focus:  Goals Group:   The focus of this group is to help patients establish daily goals to achieve during treatment and discuss how the patient can incorporate goal setting into their daily lives to aide in recovery.     Participation Level:  {BHH PARTICIPATION ZOXWR:60454}  Participation Quality:  {BHH PARTICIPATION QUALITY:22265}  Affect:  {BHH AFFECT:22266}  Cognitive:  {BHH COGNITIVE:22267}  Insight: {BHH Insight2:20797}  Engagement in Group:  {BHH ENGAGEMENT IN UJWJX:91478}  Modes of Intervention:  {BHH MODES OF INTERVENTION:22269}  Additional Comments:  ***  [name] 03/27/2023, 10:41 AM

## 2023-03-27 NOTE — Group Note (Signed)
Date:  03/27/2023 Time:  10:24 AM  Group Topic/Focus:  Goals Group:   The focus of this group is to help patients establish daily goals to achieve during treatment and discuss how the patient can incorporate goal setting into their daily lives to aide in recovery.    Participation Level:  Active  Participation Quality:  Appropriate  Affect:  Appropriate  Cognitive:  Appropriate  Insight: Appropriate  Engagement in Group:  Engaged  Modes of Intervention:  Discussion  Additional Comments:  NA  [name] 03/27/2023, 10:24 AM

## 2023-03-27 NOTE — BHH Group Notes (Signed)
BHH Group Notes:  (Nursing/MHT/Case Management/Adjunct)  Date:  03/27/2023  Time:  10:04 PM  Type of Therapy:    Wrap Up Group    Participation Level:  Did Not Attend    Dian Minahan 03/27/2023, 10:04 PM

## 2023-03-27 NOTE — Progress Notes (Signed)
Deanna [name] is sleeping but awakens easily to voice and is appropriate. States she has no anxiety or depression -denies SI/HI/AVH-currently.B/P 94/59 HR 113. Encouraged pt to drink fluids offered gatorade and she drank full 8 oz. Pt is encouraged to attend groups and participate in activities, No complaints at present

## 2023-03-27 NOTE — Group Note (Signed)
Recreation Therapy Group Note   Group Topic:Problem Solving  Group Date: 03/27/2023 Start Time: 0930 End Time: 0950 Facilitators: Fatin Bachicha-McCall, LRT,CTRS Location: [address] Dayroom   Group Topic: Communication, Team Building, Problem Solving  Goal Area(s) Addresses:  Patient will effectively work with peer towards shared goal.  Patient will identify skills used to make activity successful.  Patient will identify how skills used during activity can be applied to reach post d/c goals.   Intervention: STEM Activity- Glass blower/designer  Group Description: Tallest Pharmacist, community. In teams of 5-6, patients were given 11 craft pipe cleaners. Using the materials provided, patients were instructed to compete again the opposing team(s) to build the tallest free-standing structure from floor level. The activity was timed; difficulty increased by Clinical research associate as Production designer, theatre/television/film continued.  Systematically resources were removed with additional directions for example, placing one arm behind their back, working in silence, and shape stipulations. LRT facilitated post-activity discussion reviewing team processes and necessary communication skills involved in completion. Patients were encouraged to reflect how the skills utilized, or not utilized, in this activity can be incorporated to positively impact support systems post discharge.  Education: Pharmacist, community, Scientist, physiological, Discharge Planning   Education Outcome: Acknowledges education/In group clarification offered/Needs additional education.    Affect/Mood: Flat   Participation Level: Active   Participation Quality: Independent   Behavior: Appropriate   Speech/Thought Process: Barely audible    Insight: Moderate   Judgement: Moderate   Modes of Intervention: STEM Activity   Patient Response to Interventions:  Engaged   Education Outcome:  In group clarification offered    Clinical Observations/Individualized Feedback:  Pt attended and participated in group. Pt was quiet for the majority of group session.     Plan: Continue to engage patient in RT group sessions 2-3x/week.   Morganne Haile-McCall, LRT,CTRS 03/27/2023 11:50 AM

## 2023-03-27 NOTE — Plan of Care (Signed)
  Problem: Education: Goal: Knowledge of [facility] Education information/materials will improve Outcome: Progressing   Problem: Education: Goal: Knowledge of [facility] Education information/materials will improve Outcome: Progressing Goal: Emotional status will improve Outcome: Progressing   Problem: Education: Goal: Mental status will improve Outcome: Not Progressing   Problem: Education: Goal: Verbalization of understanding the information provided will improve Outcome: Not Progressing   Problem: Activity: Goal: Sleeping patterns will improve Outcome: Not Progressing

## 2023-03-27 NOTE — Plan of Care (Signed)
  Problem: Education: Goal: Knowledge of [facility] Education information/materials will improve Outcome: Progressing Goal: Emotional status will improve Outcome: Progressing Goal: Mental status will improve Outcome: Progressing Goal: Verbalization of understanding the information provided will improve Outcome: Progressing   Problem: Activity: Goal: Interest or engagement in activities will improve Outcome: Progressing   

## 2023-03-28 DIAGNOSIS — F314 Bipolar disorder, current episode depressed, severe, without psychotic features: Secondary | ICD-10-CM | POA: Diagnosis not present

## 2023-03-28 NOTE — Progress Notes (Signed)
Patient ID: [name], female   DOB: 10/18/1995, 27 y.o.   MRN: [id number] Deanna [name] presents with depressed mood, affect blunted. Deanna [name] remains isolative and withdrawn, and forwards little this morning answering most questions with shaking her head. She continues to require much encouragement to get her to exit her room for medications and meals and staff report she remains isolative. She denies any AH or VH and denies any SI HI. She has not been attending groups this far but did comply with taking medications after much encouragement. Pt is safe, above discussed with MD, NP and SW in treatment team. Pt has attended lunch and was observed speaking with peers more. Pt completed self inventory and rated her depression , hopelessness and anxiety all at 0/10 on scale 10 being worst 0 being none. Pt is safe, will con't to monitor.

## 2023-03-28 NOTE — Group Note (Signed)
Date:  03/28/2023 Time:  10:10 AM  Group Topic/Focus:  Goals Group:   The focus of this group is to help patients establish daily goals to achieve during treatment and discuss how the patient can incorporate goal setting into their daily lives to aide in recovery.    Participation Level:  Did Not Attend  Participation Quality:      Affect:      Cognitive:      Insight: None  Engagement in Group:   Modes of Intervention:      Additional Comments:     Reymundo Poll 03/28/2023, 10:10 AM

## 2023-03-28 NOTE — Progress Notes (Signed)
[facility] MD Progress Note  03/28/2023 4:09 PM Deanna [name]  MRN:  [id number] Subjective:   Deanna [name] is a 27 yr old female who presented on 9/25 to [facility] after being found non responsive in her car, she was admitted to [facility] on 9/30.  PPHx is significant for Bipolar Disorder and GAD, and 1 Prior Suicide Attempt (OD- 05/2016) and 1 Prior Psychiatric Hospitalization [facility] 05/2016).   Case was discussed in the multidisciplinary team. MAR was reviewed and patient was compliant with medications.  She did not receive any PRN Medications yesterday.   Psychiatric Team made the following recommendations yesterday: -Increase Abilify to 10 mg tomorrow for mood stability and depression   On interview today patient reports she slept fair last night.  She reports her appetite is doing fair.  She reports no SI, HI, or AVH.  She reports no Paranoia or Ideas of Reference.  She reports no issues with her medications.  She reports that she is not noticing much of a difference in her mood today with the increase in her Abilify.  Asked her if she had been able to think of anyone who could come visit her and she reports that her ex was supposed to come visit last night but was unable to.  She reports that most likely her ex will be able to visit tonight.  Discussed with her that this was important and encouraged her to confirm this.  She reports she does not understand why she was put on a room lockout order as she has been attending groups.  She reports no other concerns at present.  Principal Problem: Bipolar disorder (HCC) Diagnosis: Principal Problem:   Bipolar disorder (HCC)  Total Time spent with patient:  I personally spent 35 minutes on the unit in direct patient care. The direct patient care time included face-to-face time with the patient, reviewing the patient's chart, communicating with other professionals, and coordinating care. Greater than 50% of this time was spent in counseling or coordinating  care with the patient regarding goals of hospitalization, psycho-education, and discharge planning needs.   Past Psychiatric History: Bipolar Disorder and GAD, and 1 Prior Suicide Attempt (OD- 05/2016) and 1 Prior Psychiatric Hospitalization [facility] 05/2016).  Past Medical History:  Past Medical History:  Diagnosis Date   Allergy    Anxiety    Asthma    Depression    Eczema    Ovarian cyst    resolved    Past Surgical History:  Procedure Laterality Date   OVARIAN CYST REMOVAL     Family History:  Family History  Problem Relation Age of Onset   Asthma Mother    Bipolar disorder Mother    Asthma Father    Hypertension Father    Bipolar disorder Father    Hypertension Paternal Grandmother    Diabetes Paternal Grandmother    Asthma Paternal Grandmother    Family Psychiatric  History:  Reports No Known Diagnosis, Substance Abuse, or Suicides  Social History:  Social History   Substance and Sexual Activity  Alcohol Use No     Social History   Substance and Sexual Activity  Drug Use No    Social History   Socioeconomic History   Marital status: Significant Other    Spouse name: Not on file   Number of children: Not on file   Years of education: Not on file   Highest education level: Not on file  Occupational History   Not on file  Tobacco Use  Smoking status: Every Day    Current packs/day: 0.50    Types: Cigarettes   Smokeless tobacco: Never  Vaping Use   Vaping status: Every Day  Substance and Sexual Activity   Alcohol use: No   Drug use: No   Sexual activity: Yes    Partners: Female    Birth control/protection: None  Other Topics Concern   Not on file  Social History Narrative   Not on file   Social Determinants of Health   Financial Resource Strain: Not on file  Food Insecurity: Patient Declined (03/22/2023)   Hunger Vital Sign    Worried About Running Out of Food in the Last Year: Patient declined    Ran Out of Food in the Last Year: Patient  declined  Transportation Needs: Patient Declined (03/22/2023)   PRAPARE - Administrator, Civil Service (Medical): Patient declined    Lack of Transportation (Non-Medical): Patient declined  Physical Activity: Not on file  Stress: Not on file  Social Connections: Not on file   Additional Social History:                         Sleep: Fair  Appetite:  Fair  Current Medications: Current Facility-Administered Medications  Medication Dose Route Frequency Provider Last Rate Last Admin   acetaminophen (TYLENOL) tablet 650 mg  650 mg Oral Q6H PRN Ntuen, Jesusita Oka, FNP       alum & mag hydroxide-simeth (MAALOX/MYLANTA) 200-200-20 MG/5ML suspension 30 mL  30 mL Oral Q4H PRN Ntuen, Jesusita Oka, FNP       ARIPiprazole (ABILIFY) tablet 10 mg  10 mg Oral Daily Ntuen, Jesusita Oka, FNP   10 mg at 03/28/23 1610   haloperidol (HALDOL) tablet 5 mg  5 mg Oral TID PRN Phineas Inches, MD       And   LORazepam (ATIVAN) tablet 2 mg  2 mg Oral TID PRN Phineas Inches, MD       And   diphenhydrAMINE (BENADRYL) capsule 50 mg  50 mg Oral TID PRN Massengill, Harrold Donath, MD       haloperidol lactate (HALDOL) injection 5 mg  5 mg Intramuscular TID PRN Massengill, Harrold Donath, MD       And   LORazepam (ATIVAN) injection 2 mg  2 mg Intramuscular TID PRN Massengill, Harrold Donath, MD       And   diphenhydrAMINE (BENADRYL) injection 50 mg  50 mg Intramuscular TID PRN Massengill, Harrold Donath, MD       hydrOXYzine (ATARAX) tablet 25 mg  25 mg Oral TID PRN Phineas Inches, MD   25 mg at 03/26/23 2155   magnesium hydroxide (MILK OF MAGNESIA) suspension 30 mL  30 mL Oral Daily PRN Ntuen, Jesusita Oka, FNP       traZODone (DESYREL) tablet 50 mg  50 mg Oral QHS PRN Massengill, Harrold Donath, MD   50 mg at 03/26/23 2155   triamcinolone ointment (KENALOG) 0.5 %   Topical BID Cecilie Lowers, FNP   Given at 03/27/23 9604   vitamin D3 (CHOLECALCIFEROL) tablet 1,000 Units  1,000 Units Oral Daily Cecilie Lowers, FNP   1,000 Units at 03/28/23 5409     Lab Results:  Results for orders placed or performed during the hospital encounter of 03/22/23 (from the past 48 hour(s))  Basic metabolic panel     Status: Abnormal   Collection Time: 03/27/23  6:17 PM  Result Value Ref Range   Sodium 136 135 -  145 mmol/L   Potassium 4.1 3.5 - 5.1 mmol/L   Chloride 103 98 - 111 mmol/L   CO2 23 22 - 32 mmol/L   Glucose, Bld 99 70 - 99 mg/dL    Comment: Glucose reference range applies only to samples taken after fasting for at least 8 hours.   BUN 10 6 - 20 mg/dL   Creatinine, Ser 1.60 (H) 0.44 - 1.00 mg/dL   Calcium 9.3 8.9 - 73.7 mg/dL   GFR, Estimated >10 >62 mL/min    Comment: (NOTE) Calculated using the CKD-EPI Creatinine Equation (2021)    Anion gap 10 5 - 15    Comment: Performed at [facility], [address] [place], [place] [address]    Blood Alcohol level:  Lab Results  Component Value Date   [facility] <10 03/19/2023   ETH <5 06/01/2016    Metabolic Disorder Labs: Lab Results  Component Value Date   HGBA1C 5.1 03/24/2023   MPG 99.67 03/24/2023   No results found for: "PROLACTIN" Lab Results  Component Value Date   CHOL 188 03/24/2023   TRIG 107 03/24/2023   HDL 40 (L) 03/24/2023   CHOLHDL 4.7 03/24/2023   VLDL 21 03/24/2023   LDLCALC 127 (H) 03/24/2023    Physical Findings: AIMS:  , ,  ,  ,    CIWA:    COWS:     Musculoskeletal: Strength & Muscle Tone: within normal limits Gait & Station: normal Patient leans: N/A  Psychiatric Specialty Exam:  Presentation  General Appearance:  Appropriate for Environment; Casual  Eye Contact: Poor  Speech: Clear and Coherent; Slow  Speech Volume: Decreased  Handedness: Right   Mood and Affect  Mood: Depressed  Affect: Depressed; Flat (Minimizing)   Thought Process  Thought Processes: Linear  Descriptions of Associations:Intact  Orientation:Full (Time, Place and Person)  Thought Content:Logical; WDL  History of  Schizophrenia/Schizoaffective disorder:No data recorded Duration of Psychotic Symptoms:No data recorded Hallucinations:Hallucinations: None  Ideas of Reference:None  Suicidal Thoughts:Suicidal Thoughts: No  Homicidal Thoughts:Homicidal Thoughts: No   Sensorium  Memory: Immediate Fair; Recent Fair  Judgment: Fair  Insight: Shallow   Executive Functions  Concentration: Fair  Attention Span: Fair  Recall: [employer] of Knowledge: Fair  Language: Fair   Psychomotor Activity  Psychomotor Activity: Psychomotor Activity: Normal   Assets  Assets: Communication Skills; Desire for Improvement   Sleep  Sleep: Sleep: Fair    Physical Exam: Physical Exam Vitals and nursing note reviewed.  Constitutional:      General: She is not in acute distress.    Appearance: Normal appearance. She is normal weight. She is not ill-appearing or toxic-appearing.  HENT:     Head: Normocephalic and atraumatic.  Pulmonary:     Effort: Pulmonary effort is normal.  Musculoskeletal:        General: Normal range of motion.  Neurological:     General: No focal deficit present.     Mental Status: She is alert.    Review of Systems  Respiratory:  Negative for cough and shortness of breath.   Cardiovascular:  Negative for chest pain.  Gastrointestinal:  Negative for abdominal pain, constipation, diarrhea, nausea and vomiting.  Neurological:  Negative for dizziness, weakness and headaches.  Psychiatric/Behavioral:  Positive for depression. Negative for hallucinations and suicidal ideas. The patient is nervous/anxious.    Blood pressure 94/76, pulse (!) 115, temperature (!) 97.3 F (36.3 C), resp. rate 16, height 5\' 3"  (1.6 m), weight 74.5 kg, SpO2  99%. Body mass index is 29.09 kg/m.   Treatment Plan Summary: Daily contact with patient to assess and evaluate symptoms and progress in treatment and Medication management  Deanna [name] is a 27 yr old female who presented  on 9/25 to [facility] after being found non responsive in her car, she was admitted to [facility] on 9/30.  PPHx is significant for Bipolar Disorder and GAD, and 1 Prior Suicide Attempt (OD- 05/2016) and 1 Prior Psychiatric Hospitalization [facility] 05/2016).   Abbe has tolerated the increase in Abilify made this morning.  She continues to minimize and downplay her symptoms.  Her Ex was unable to visit last night but is supposed to be coming this evening.  If she does come to visit we will attempt to contact her to see if patient is improving.  We will not make any other changes to her medications at this time.  We will continue to monitor.   Bipolar Disorder, Depressed  GAD: -Increase Abilify to 10 mg daily for mood stability and depression -Continue Agitation Protocol: Haldol/Ativan/Benadryl   -Continue Triamcinolone ointment 0.5% BID -Continue PRN's: Tylenol, Maalox, Atarax, Milk of Magnesia, Trazodone   Lauro Franklin, MD 03/28/2023, 4:09 PM

## 2023-03-28 NOTE — [facility] Group Notes (Signed)
[facility] Group Notes:  (Nursing/MHT/Case Management/Adjunct)  Date:  03/28/2023  Time:  9:10 PM  Type of Therapy:   Wrap-up group  Participation Level:  Did Not Attend  Participation Quality:    Affect:    Cognitive:    Insight:    Engagement in Group:    Modes of Intervention:    Summary of Progress/Problems: Pt refused to attend group.  Noah Delaine 03/28/2023, 9:10 PM

## 2023-03-28 NOTE — Plan of Care (Signed)

## 2023-03-28 NOTE — Progress Notes (Signed)
   03/27/23 2159  Psych Admission Type (Psych Patients Only)  Admission Status Voluntary  Psychosocial Assessment  Patient Complaints None  Eye Contact Fair  Facial Expression Flat  Affect Depressed  Speech Logical/coherent  Interaction Isolative  Motor Activity Slow  Appearance/Hygiene Unremarkable  Behavior Characteristics Cooperative;Appropriate to situation  Mood Depressed  Thought Process  Coherency WDL  Content WDL  Delusions None reported or observed  Perception WDL  Hallucination None reported or observed  Judgment Impaired  Confusion None  Danger to Self  Current suicidal ideation? Denies  Agreement Not to Harm Self Yes  Description of Agreement verbal  Danger to Others  Danger to Others None reported or observed

## 2023-03-28 NOTE — Group Note (Signed)
Date:  03/28/2023 Time:  5:20 PM  Group Topic/Focus:  Wellness Toolbox:   The focus of this group is to discuss various aspects of wellness, balancing those aspects and exploring ways to increase the ability to experience wellness.  Patients will create a wellness toolbox for use upon discharge.    Participation Level:  Minimal  Participation Quality:  Attentive  Affect:  Appropriate  Cognitive:  Appropriate  Insight: Appropriate  Engagement in Group:  Limited  Modes of Intervention:  Exploration  Additional Comments:     Reymundo Poll 03/28/2023, 5:20 PM

## 2023-03-28 NOTE — Progress Notes (Addendum)
Patient presents with a pleasant and calm affect.  Patient did not report any anxiety, depression, SI/HI or AVH. Pt reported having a good day and did not mention any concerns.   Patient had no scheduled medications ordered.  Support and encouragement offered. Q15 mins checks carried out. Patient is socializing with peers in front of her room.   Patient is able to vocalize needs. Care plan is being implemented. Will continue to monitor patient .

## 2023-03-29 DIAGNOSIS — F314 Bipolar disorder, current episode depressed, severe, without psychotic features: Secondary | ICD-10-CM | POA: Diagnosis not present

## 2023-03-29 NOTE — Progress Notes (Signed)
   03/29/23 0530  15 Minute Checks  Location Bedroom  Visual Appearance Calm  Behavior Sleeping  Sleep (Behavioral Health Patients Only)  Calculate sleep? (Click Yes once per 24 hr at 0600 safety check) Yes  Documented sleep last 24 hours 10.5

## 2023-03-29 NOTE — [facility] Group Notes (Signed)
[facility] Group Notes:  (Nursing/MHT/Case Management/Adjunct)  Date:  03/29/2023  Time:  4:09 PM  Type of Therapy:  Psychoeducational Skills  Participation Level:  Did Not Attend  Participation Quality:  na    Summary of Progress/Problems: Did not attend afternoon RN GROUP.   Malva Limes 03/29/2023, 4:09 PM

## 2023-03-29 NOTE — Progress Notes (Addendum)
Patient ID: Deanna [name], female   DOB: 25-Jun-1995, 27 y.o.   MRN: [id number] Deanna [name] presents with depressed mood, affect blunted. [name] remains isolative and withdrawn, and forwards little when approached this am. She continues to require much encouragement to get her to exit her room for medications and meals and staff report she remains isolative except at meal times yesterday. Most of shift thus far the patient has been in bed, resting quietly.. She denies any AH or VH and denies any SI HI. She has not been attending groups this far despite encouragement. Pt is safe, above discussed with MD, NP and SW in treatment team. Pt appears to minimize her symptoms. She completed self inventory and rates her depression, anxiety and hopelessness all at 0/10 on scale 10 being worst 0 being none. Pt put goal for today is '' working on going home and do what I need to do .'' Pt is safe, will con't to monitor.

## 2023-03-29 NOTE — Group Note (Signed)
Date:  03/29/2023 Time:  10:42 PM  Group Topic/Focus:  Wrap-Up Group:   The focus of this group is to help patients review their daily goal of treatment and discuss progress on daily workbooks.    Participation Level:  Active  Participation Quality:  Appropriate and Sharing  Affect:  Appropriate  Cognitive:  Appropriate  Insight: Appropriate  Engagement in Group:  Engaged  Modes of Intervention:  Activity and Socialization  Additional Comments:  The patient stated that she had a good day. The patient shared that she had a visit from her mother and that she was able to talk with her kids. The patient did not share if she had a goal. The patient rated her day a 10/10. The patient participated in the group activity at the end of the group.   Kennieth Francois 03/29/2023, 10:42 PM

## 2023-03-29 NOTE — Plan of Care (Signed)

## 2023-03-29 NOTE — Plan of Care (Signed)

## 2023-03-29 NOTE — Group Note (Signed)
Date:  03/29/2023 Time:  2:02 PM  Group Topic/Focus:  Goals Group:   The focus of this group is to help patients establish daily goals to achieve during treatment and discuss how the patient can incorporate goal setting into their daily lives to aide in recovery.    Participation Level:  Active  Participation Quality:  Appropriate  Affect:  Appropriate  Cognitive:  Alert  Insight: Good  Engagement in Group:  Engaged  Modes of Intervention:  Education  Additional Comments:    [name] 03/29/2023, 2:02 PM

## 2023-03-29 NOTE — [facility] Group Notes (Signed)
Type of Therapy and Topic:  Group Therapy:Gratitude  Participation Level:  Did Not Attend   Description of Group:   In this group, patients shared and discussed the importance of acknowledging the elements in their lives for which they are grateful and how this can positively impact their mood.  The group discussed how bringing the positive elements of their lives to the forefront of their minds can help with recovery from any illness, physical or mental.  An exercise was done as a group in which a list was made of gratitude items in order to encourage participants to consider other potential positives in their lives.  Therapeutic Goals: Patients will identify one or more item for which they are grateful in each of 6 categories:  people, experiences, things, places, skills, and other. Patients will discuss how it is possible to seek out gratitude in even bad situations. Patients will explore other possible items of gratitude that they could remember.   Summary of Patient Progress: NA  Therapeutic Modalities:   Solution-Focused Therapy Activity

## 2023-03-29 NOTE — Progress Notes (Signed)
[facility] MD Progress Note  03/29/2023 12:52 PM Deanna [name]  MRN:  [id number] Subjective:   Deanna [name] is a 27 yr old female who presented on 9/25 to [facility] after being found non responsive in her car, she was admitted to [facility] on 9/30.  PPHx is significant for Bipolar Disorder and GAD, and 1 Prior Suicide Attempt (OD- 05/2016) and 1 Prior Psychiatric Hospitalization [facility] 05/2016).   Case was discussed in the multidisciplinary team. MAR was reviewed and patient was compliant with medications.  She did not require any PRN Medications yesterday.   Psychiatric Team made the following recommendations yesterday: -Increase Abilify to 10 mg tomorrow for mood stability and depression   On interview today patient reports she slept good last night.  She reports her appetite is doing good.  She reports no SI, HI, or AVH.  She reports no Paranoia or Ideas of Reference.  She reports no issues with her medications.  She reports her mood is "ok."  When asked if her ex was able to visit last night she reports she was not.  Asked if she thought her ex might be able to come tonight to visit and she reports she thinks so.  Discussed with her the importance of this visit as it would allow [place] to gauge where she is in treatment and if we can start the discharge process.  She reports no other concerns at present.   Principal Problem: Bipolar disorder (HCC) Diagnosis: Principal Problem:   Bipolar disorder (HCC)  Total Time spent with patient:  I personally spent 35 minutes on the unit in direct patient care. The direct patient care time included face-to-face time with the patient, reviewing the patient's chart, communicating with other professionals, and coordinating care. Greater than 50% of this time was spent in counseling or coordinating care with the patient regarding goals of hospitalization, psycho-education, and discharge planning needs.   Past Psychiatric History: Bipolar Disorder and GAD, and 1 Prior  Suicide Attempt (OD- 05/2016) and 1 Prior Psychiatric Hospitalization [facility] 05/2016).  Past Medical History:  Past Medical History:  Diagnosis Date   Allergy    Anxiety    Asthma    Depression    Eczema    Ovarian cyst    resolved    Past Surgical History:  Procedure Laterality Date   OVARIAN CYST REMOVAL     Family History:  Family History  Problem Relation Age of Onset   Asthma Mother    Bipolar disorder Mother    Asthma Father    Hypertension Father    Bipolar disorder Father    Hypertension Paternal Grandmother    Diabetes Paternal Grandmother    Asthma Paternal Grandmother    Family Psychiatric  History:  Reports No Known Diagnosis, Substance Abuse, or Suicides  Social History:  Social History   Substance and Sexual Activity  Alcohol Use No     Social History   Substance and Sexual Activity  Drug Use No    Social History   Socioeconomic History   Marital status: Significant Other    Spouse name: Not on file   Number of children: Not on file   Years of education: Not on file   Highest education level: Not on file  Occupational History   Not on file  Tobacco Use   Smoking status: Every Day    Current packs/day: 0.50    Types: Cigarettes   Smokeless tobacco: Never  Vaping Use   Vaping status: Every Day  Substance and Sexual Activity   Alcohol use: No   Drug use: No   Sexual activity: Yes    Partners: Female    Birth control/protection: None  Other Topics Concern   Not on file  Social History Narrative   Not on file   Social Determinants of Health   Financial Resource Strain: Not on file  Food Insecurity: Patient Declined (03/22/2023)   Hunger Vital Sign    Worried About Running Out of Food in the Last Year: Patient declined    Ran Out of Food in the Last Year: Patient declined  Transportation Needs: Patient Declined (03/22/2023)   PRAPARE - Administrator, Civil Service (Medical): Patient declined    Lack of Transportation  (Non-Medical): Patient declined  Physical Activity: Not on file  Stress: Not on file  Social Connections: Not on file   Additional Social History:                         Sleep: Good   Appetite:  Good  Current Medications: Current Facility-Administered Medications  Medication Dose Route Frequency Provider Last Rate Last Admin   acetaminophen (TYLENOL) tablet 650 mg  650 mg Oral Q6H PRN Ntuen, Jesusita Oka, FNP       alum & mag hydroxide-simeth (MAALOX/MYLANTA) 200-200-20 MG/5ML suspension 30 mL  30 mL Oral Q4H PRN Ntuen, Jesusita Oka, FNP       ARIPiprazole (ABILIFY) tablet 10 mg  10 mg Oral Daily Ntuen, Jesusita Oka, FNP   10 mg at 03/29/23 0754   haloperidol (HALDOL) tablet 5 mg  5 mg Oral TID PRN Phineas Inches, MD       And   LORazepam (ATIVAN) tablet 2 mg  2 mg Oral TID PRN Phineas Inches, MD       And   diphenhydrAMINE (BENADRYL) capsule 50 mg  50 mg Oral TID PRN Massengill, Harrold Donath, MD       haloperidol lactate (HALDOL) injection 5 mg  5 mg Intramuscular TID PRN Massengill, Harrold Donath, MD       And   LORazepam (ATIVAN) injection 2 mg  2 mg Intramuscular TID PRN Massengill, Harrold Donath, MD       And   diphenhydrAMINE (BENADRYL) injection 50 mg  50 mg Intramuscular TID PRN Massengill, Harrold Donath, MD       hydrOXYzine (ATARAX) tablet 25 mg  25 mg Oral TID PRN Phineas Inches, MD   25 mg at 03/26/23 2155   magnesium hydroxide (MILK OF MAGNESIA) suspension 30 mL  30 mL Oral Daily PRN Ntuen, Jesusita Oka, FNP       traZODone (DESYREL) tablet 50 mg  50 mg Oral QHS PRN Massengill, Harrold Donath, MD   50 mg at 03/26/23 2155   triamcinolone ointment (KENALOG) 0.5 %   Topical BID Cecilie Lowers, FNP   Given at 03/27/23 1324   vitamin D3 (CHOLECALCIFEROL) tablet 1,000 Units  1,000 Units Oral Daily Ntuen, Jesusita Oka, FNP   1,000 Units at 03/29/23 0754    Lab Results:  Results for orders placed or performed during the hospital encounter of 03/22/23 (from the past 48 hour(s))  Basic metabolic panel     Status:  Abnormal   Collection Time: 03/27/23  6:17 PM  Result Value Ref Range   Sodium 136 135 - 145 mmol/L   Potassium 4.1 3.5 - 5.1 mmol/L   Chloride 103 98 - 111 mmol/L   CO2 23 22 - 32 mmol/L   Glucose,  Bld 99 70 - 99 mg/dL    Comment: Glucose reference range applies only to samples taken after fasting for at least 8 hours.   BUN 10 6 - 20 mg/dL   Creatinine, Ser 9.14 (H) 0.44 - 1.00 mg/dL   Calcium 9.3 8.9 - 78.2 mg/dL   GFR, Estimated >95 >62 mL/min    Comment: (NOTE) Calculated using the CKD-EPI Creatinine Equation (2021)    Anion gap 10 5 - 15    Comment: Performed at [facility], [address] [place], [place] [address]    Blood Alcohol level:  Lab Results  Component Value Date   [facility] <10 03/19/2023   ETH <5 06/01/2016    Metabolic Disorder Labs: Lab Results  Component Value Date   HGBA1C 5.1 03/24/2023   MPG 99.67 03/24/2023   No results found for: "PROLACTIN" Lab Results  Component Value Date   CHOL 188 03/24/2023   TRIG 107 03/24/2023   HDL 40 (L) 03/24/2023   CHOLHDL 4.7 03/24/2023   VLDL 21 03/24/2023   LDLCALC 127 (H) 03/24/2023    Physical Findings: AIMS:  , ,  ,  ,    CIWA:    COWS:     Musculoskeletal: Strength & Muscle Tone: within normal limits Gait & Station: normal Patient leans: N/A  Psychiatric Specialty Exam:  Presentation  General Appearance:  Appropriate for Environment; Casual  Eye Contact: Poor  Speech: Clear and Coherent; Slow  Speech Volume: Decreased  Handedness: Right   Mood and Affect  Mood: Depressed  Affect: Depressed; Flat; Constricted (guarded)   Thought Process  Thought Processes: Linear  Descriptions of Associations:Intact  Orientation:Full (Time, Place and Person)  Thought Content:Logical; WDL  History of Schizophrenia/Schizoaffective disorder:No data recorded Duration of Psychotic Symptoms:No data recorded Hallucinations:Hallucinations: None  Ideas of  Reference:None  Suicidal Thoughts:Suicidal Thoughts: No  Homicidal Thoughts:Homicidal Thoughts: No   Sensorium  Memory: Immediate Fair; Recent Fair  Judgment: Fair  Insight: Shallow   Executive Functions  Concentration: Fair  Attention Span: Fair  Recall: [employer] of Knowledge: Fair  Language: Fair   Psychomotor Activity  Psychomotor Activity: Psychomotor Activity: Normal   Assets  Assets: Communication Skills; Desire for Improvement; Resilience   Sleep  Sleep: Sleep: Good Number of Hours of Sleep: 10.5    Physical Exam: Physical Exam Vitals and nursing note reviewed.  Constitutional:      General: She is not in acute distress.    Appearance: Normal appearance. She is normal weight. She is not ill-appearing or toxic-appearing.  HENT:     Head: Normocephalic and atraumatic.  Pulmonary:     Effort: Pulmonary effort is normal.  Musculoskeletal:        General: Normal range of motion.  Neurological:     General: No focal deficit present.     Mental Status: She is alert.    Review of Systems  Respiratory:  Negative for cough and shortness of breath.   Cardiovascular:  Negative for chest pain.  Gastrointestinal:  Negative for abdominal pain, constipation, diarrhea, nausea and vomiting.  Neurological:  Negative for dizziness, weakness and headaches.  Psychiatric/Behavioral:  Negative for depression, hallucinations and suicidal ideas. The patient is not nervous/anxious.    Blood pressure (!) 90/58, pulse (!) 124, temperature 98.5 F (36.9 C), temperature source Oral, resp. rate 18, height 5\' 3"  (1.6 m), weight 74.5 kg, SpO2 100%. Body mass index is 29.09 kg/m.   Treatment Plan Summary: Daily contact with patient to assess and  evaluate symptoms and progress in treatment and Medication management  Deanna [name] is a 27 yr old female who presented on 9/25 to [facility] after being found non responsive in her car, she was admitted to [facility] on 9/30.   PPHx is significant for Bipolar Disorder and GAD, and 1 Prior Suicide Attempt (OD- 05/2016) and 1 Prior Psychiatric Hospitalization [facility] 05/2016).   Meliss continues to be minimal when it comes to anything about herself but has been seen interacting more with other patients on the unit and her sleep and appetite are improving.  At this point it is important to obtain some kind of baseline.  Stressed to her the importance of ensuring her ex visits so that we can determine if she is back at baseline.  We will not make any changes to her medications at this time.  We will continue to monitor.   Bipolar Disorder, Depressed  GAD: -Continue Abilify 10 mg daily for mood stability and depression -Continue Agitation Protocol: Haldol/Ativan/Benadryl   -Continue Vitamin D3 1000 units daily -Continue Triamcinolone ointment 0.5% BID -Continue PRN's: Tylenol, Maalox, Atarax, Milk of Magnesia, Trazodone   Lauro Franklin, MD 03/29/2023, 12:52 PM

## 2023-03-30 DIAGNOSIS — F314 Bipolar disorder, current episode depressed, severe, without psychotic features: Secondary | ICD-10-CM | POA: Diagnosis not present

## 2023-03-30 NOTE — [facility] Group Notes (Signed)
Pt did attend AA group  

## 2023-03-30 NOTE — Group Note (Signed)
Date:  03/30/2023 Time:  11:18 AM  Group Topic/Focus:  Goals Group:   The focus of this group is to help patients establish daily goals to achieve during treatment and discuss how the patient can incorporate goal setting into their daily lives to aide in recovery.    Participation Level:  Did Not Attend  Participation Quality:    Affect:      Cognitive:      Insight: None  Engagement in Group:      Modes of Intervention:      Additional Comments:     Reymundo Poll 03/30/2023, 11:18 AM

## 2023-03-30 NOTE — [facility] Suicide Risk Assessment (Signed)
[facility] INPATIENT:  Family/Significant Other Suicide Prevention Education  Suicide Prevention Education:  Education Completed; 03-30-2023,  Deanna [name] [phone] has been identified by the patient's mother who will be residing with post discharge with whom the patient will be residing, and identified as the person(s) who will aid the patient in the event of a mental health crisis (suicidal ideations/suicide attempt).  With written consent from the patient, the family member/significant other has been provided the following suicide prevention education, prior to the and/or following the discharge of the patient.  The suicide prevention education provided includes the following: Suicide risk factors Suicide prevention and interventions National Suicide Hotline telephone number [facility] assessment telephone number [facility] Emergency Assistance 911 [facility] and/or Residential Mobile Crisis Unit telephone number  Request made of family/significant other to: Remove weapons (e.g., guns, rifles, knives), all items previously/currently identified as safety concern.   Remove drugs/medications (over-the-counter, prescriptions, illicit drugs), all items previously/currently identified as a safety concern.  Deanna [name] [phone] verbalizes understanding of the suicide prevention education information provided.  The family member/significant other agrees to remove the items of safety concern listed above.  Deanna [name] 03/30/2023, 9:28 AM

## 2023-03-30 NOTE — Progress Notes (Addendum)
[facility] MD Progress Note  03/30/2023 12:26 PM Deanna [name]  MRN:  [id number]  Reason for for admission:   NYIAH PIANKA is a 27 yr old female who presented on 9/25 to [facility] after being found non responsive in her car, she was admitted to [facility] on 9/30.  PPHx is significant for Bipolar Disorder and GAD, and 1 Prior Suicide Attempt (OD- 05/2016) and 1 Prior Psychiatric Hospitalization [facility] 05/2016).  Case was discussed in the multidisciplinary team. MAR was reviewed and patient was compliant with medications.  She did not require any PRN Medications yesterday.  Psychiatric Team made the following recommendations yesterday:  -Continue Abilify 10 mg for mood stability and depression -Continue trazodon tablets 50 mg q. nightly p.o. as needed for insomnia -Continue hydroxyzine tablets 25 mg p.o. 3 times daily as needed for anxiety  On interview today patient reports she slept good last night.  She reports her appetite is doing good.  She reports no SI, HI, or AVH.  She reports no Paranoia or Ideas of Reference.  She reports no issues with her medications.  Today's assessment notes: Patient is seen and examined in her room lying on her bed.  She presents alert, and oriented to person, place, time, and situation.  Mood appears euthymic and improving.  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  Chart reviewed and findings shared with the treatment team and consult with attending psychiatrist.  She reports that her mom came to visit with her last night and has seen some improvement in her mood and affect.  Denies delusional thinking, paranoia.  Patient does not appear to be responding to internal stimuli.  Denies acute distress.  On assessment today, the pt reports that her mood is euthymic, improved since admission, and stable. Denies feeling down, depressed, or sad.  Reports that anxiety symptoms are at  manageable level.  Sleep is stable. Appetite is stable.  Concentration is without complaint.  Energy level is adequate. Denies having any suicidal thoughts. Denies having any suicidal intent and plan.  Denies having any HI.  Denies having psychotic symptoms.   Denies having side effects to current psychiatric medications.   Discussed discharge planning:  : How to identify the signs of impending crisis, use of internal coping strategies, reaching out to friends and family that can help navigate a crisis, and a list of mental health professionals and agencies to call. Further to follow up on her mental health appointments and her PCP appointments.  Principal Problem: Bipolar disorder (HCC) Diagnosis: Principal Problem:   Bipolar disorder (HCC)  Total Time spent with patient:  I personally spent 35 minutes on the unit in direct patient care. The direct patient care time included face-to-face time with the patient, reviewing the patient's chart, communicating with other professionals, and coordinating care. Greater than 50% of this time was spent in counseling or coordinating care with the patient regarding goals of hospitalization, psycho-education, and discharge planning needs.  Past Psychiatric History: Bipolar Disorder and GAD, and 1 Prior Suicide Attempt (OD- 05/2016) and 1 Prior Psychiatric Hospitalization [facility] 05/2016).  Past Medical History:  Past Medical History:  Diagnosis Date   Allergy    Anxiety    Asthma    Depression    Eczema    Ovarian cyst    resolved    Past Surgical History:  Procedure Laterality Date   OVARIAN CYST REMOVAL  Family History:  Family History  Problem Relation Age of Onset   Asthma Mother    Bipolar disorder Mother    Asthma Father    Hypertension Father    Bipolar disorder Father    Hypertension Paternal Grandmother    Diabetes Paternal Grandmother    Asthma Paternal Grandmother    Family Psychiatric  History:  Reports No Known  Diagnosis, Substance Abuse, or Suicides  Social History:  Social History   Substance and Sexual Activity  Alcohol Use No     Social History   Substance and Sexual Activity  Drug Use No    Social History   Socioeconomic History   Marital status: Significant Other    Spouse name: Not on file   Number of children: Not on file   Years of education: Not on file   Highest education level: Not on file  Occupational History   Not on file  Tobacco Use   Smoking status: Every Day    Current packs/day: 0.50    Types: Cigarettes   Smokeless tobacco: Never  Vaping Use   Vaping status: Every Day  Substance and Sexual Activity   Alcohol use: No   Drug use: No   Sexual activity: Yes    Partners: Female    Birth control/protection: None  Other Topics Concern   Not on file  Social History Narrative   Not on file   Social Determinants of Health   Financial Resource Strain: Not on file  Food Insecurity: Patient Declined (03/22/2023)   Hunger Vital Sign    Worried About Running Out of Food in the Last Year: Patient declined    Ran Out of Food in the Last Year: Patient declined  Transportation Needs: Patient Declined (03/22/2023)   PRAPARE - Administrator, Civil Service (Medical): Patient declined    Lack of Transportation (Non-Medical): Patient declined  Physical Activity: Not on file  Stress: Not on file  Social Connections: Not on file   Additional Social History:    Sleep: Good   Appetite:  Good  Current Medications: Current Facility-Administered Medications  Medication Dose Route Frequency Provider Last Rate Last Admin   acetaminophen (TYLENOL) tablet 650 mg  650 mg Oral Q6H PRN Jayleen Afonso, Jesusita Oka, FNP       alum & mag hydroxide-simeth (MAALOX/MYLANTA) 200-200-20 MG/5ML suspension 30 mL  30 mL Oral Q4H PRN Janaria Mccammon, Jesusita Oka, FNP       ARIPiprazole (ABILIFY) tablet 10 mg  10 mg Oral Daily Anisah Kuck, Jesusita Oka, FNP   10 mg at 03/30/23 8657   haloperidol (HALDOL) tablet 5 mg   5 mg Oral TID PRN Phineas Inches, MD       And   LORazepam (ATIVAN) tablet 2 mg  2 mg Oral TID PRN Massengill, Harrold Donath, MD       And   diphenhydrAMINE (BENADRYL) capsule 50 mg  50 mg Oral TID PRN Massengill, Harrold Donath, MD       haloperidol lactate (HALDOL) injection 5 mg  5 mg Intramuscular TID PRN Massengill, Harrold Donath, MD       And   LORazepam (ATIVAN) injection 2 mg  2 mg Intramuscular TID PRN Massengill, Harrold Donath, MD       And   diphenhydrAMINE (BENADRYL) injection 50 mg  50 mg Intramuscular TID PRN Massengill, Harrold Donath, MD       hydrOXYzine (ATARAX) tablet 25 mg  25 mg Oral TID PRN Phineas Inches, MD   25 mg at 03/26/23 2155  magnesium hydroxide (MILK OF MAGNESIA) suspension 30 mL  30 mL Oral Daily PRN Michie Molnar, Jesusita Oka, FNP       traZODone (DESYREL) tablet 50 mg  50 mg Oral QHS PRN Massengill, Harrold Donath, MD   50 mg at 03/26/23 2155   triamcinolone ointment (KENALOG) 0.5 %   Topical BID Cecilie Lowers, FNP   Given at 03/27/23 4098   vitamin D3 (CHOLECALCIFEROL) tablet 1,000 Units  1,000 Units Oral Daily Cecilie Lowers, FNP   1,000 Units at 03/30/23 1191    Lab Results:  No results found for this or any previous visit (from the past 48 hour(s)).  Blood Alcohol level:  Lab Results  Component Value Date   ETH <10 03/19/2023   ETH <5 06/01/2016    Metabolic Disorder Labs: Lab Results  Component Value Date   HGBA1C 5.1 03/24/2023   MPG 99.67 03/24/2023   No results found for: "PROLACTIN" Lab Results  Component Value Date   CHOL 188 03/24/2023   TRIG 107 03/24/2023   HDL 40 (L) 03/24/2023   CHOLHDL 4.7 03/24/2023   VLDL 21 03/24/2023   LDLCALC 127 (H) 03/24/2023   Physical Findings: AIMS:  , ,  ,  ,    CIWA:    COWS:     Musculoskeletal: Strength & Muscle Tone: within normal limits Gait & Station: normal Patient leans: N/A  Psychiatric Specialty Exam:  Presentation  General Appearance:  Casual; Fairly Groomed  Eye Contact: Good  Speech: Clear and Coherent  Speech  Volume: Normal  Handedness: Right  Mood and Affect  Mood: -- (Improving)  Affect: Congruent  Thought Process  Thought Processes: Coherent; Goal Directed; Linear  Descriptions of Associations:Intact  Orientation:Full (Time, Place and Person)  Thought Content:Logical  History of Schizophrenia/Schizoaffective disorder:No data recorded Duration of Psychotic Symptoms:No data recorded Hallucinations:Hallucinations: None  Ideas of Reference:None  Suicidal Thoughts:Suicidal Thoughts: No  Homicidal Thoughts:Homicidal Thoughts: No  Sensorium  Memory: Immediate Good; Recent Good  Judgment: Fair  Insight: Fair  Art therapist  Concentration: Good  Attention Span: Good  Recall: Fair  Fund of Knowledge: Fair  Language: Good  Psychomotor Activity  Psychomotor Activity: Psychomotor Activity: Normal  Assets  Assets: Communication Skills  Sleep  Sleep: Sleep: Good Number of Hours of Sleep: 8.75  Physical Exam: Physical Exam Vitals and nursing note reviewed.  Constitutional:      General: She is not in acute distress.    Appearance: Normal appearance. She is normal weight. She is not ill-appearing or toxic-appearing.  HENT:     Head: Normocephalic and atraumatic.     Nose: Nose normal.     Mouth/Throat:     Mouth: Mucous membranes are moist.  Eyes:     Extraocular Movements: Extraocular movements intact.  Cardiovascular:     Rate and Rhythm: Tachycardia present.  Pulmonary:     Effort: Pulmonary effort is normal.  Abdominal:     Comments: Deferred  Genitourinary:    Comments: Deferred Musculoskeletal:        General: Normal range of motion.     Cervical back: Normal range of motion.  Skin:    General: Skin is warm.  Neurological:     General: No focal deficit present.     Mental Status: She is alert and oriented to person, place, and time.  Psychiatric:        Mood and Affect: Mood normal.        Behavior: Behavior normal.  Thought Content: Thought content normal.    Review of Systems  Constitutional:  Negative for chills and fever.  HENT:  Negative for sore throat.   Eyes:  Negative for blurred vision.  Respiratory:  Negative for cough, shortness of breath and wheezing.   Cardiovascular:  Negative for chest pain and palpitations.  Gastrointestinal:  Negative for abdominal pain, constipation, diarrhea, heartburn, nausea and vomiting.  Genitourinary:  Negative for dysuria, frequency and urgency.  Musculoskeletal: Negative.   Skin:  Negative for itching and rash.  Neurological:  Negative for dizziness, weakness and headaches.  Endo/Heme/Allergies:        See allergy listing  Psychiatric/Behavioral:  Positive for depression. The patient is nervous/anxious.    Blood pressure 100/66, pulse (!) 126, temperature 98.4 F (36.9 C), temperature source Oral, resp. rate 16, height 5\' 3"  (1.6 m), weight 74.5 kg, SpO2 100%. Body mass index is 29.09 kg/m.  Treatment Plan Summary: Daily contact with patient to assess and evaluate symptoms and progress in treatment and Medication management  Deanna [name] is a 27 yr old female who presented on 9/25 to [facility] after being found non responsive in her car, she was admitted to [facility] on 9/30.  PPHx is significant for Bipolar Disorder and GAD, and 1 Prior Suicide Attempt (OD- 05/2016) and 1 Prior Psychiatric Hospitalization [facility] 05/2016).   Tyffani continues to be minimal when it comes to anything about herself but has been seen interacting more with other patients on the unit and her sleep and appetite are improving.  At this point it is important to obtain some kind of baseline.  Stressed to her the importance of ensuring her ex visits so that we can determine if she is back at baseline.  We will not make any changes to her medications at this time.  We will continue to monitor.  Bipolar Disorder, Depressed  GAD: -Continue Abilify 10 mg daily for mood stability and  depression -Continue Agitation Protocol: Haldol/Ativan/Benadryl -Continue Vitamin D3 1000 units daily -Continue Triamcinolone ointment 0.5% BID -Continue PRN's: Tylenol, Maalox, Atarax, Milk of Magnesia, Trazodone   Cecilie Lowers, FNP 03/30/2023, 12:26 PM Patient ID: [name], female   DOB: [birth year], 27 y.o.   MRN: [id number]

## 2023-03-30 NOTE — Plan of Care (Signed)

## 2023-03-30 NOTE — Progress Notes (Signed)
CSW met with pt to discuss discharge planning. Pt provided consent to speak with her Deanna [name] (Mother) [phone]. Ms. Tirone asserts that the pt has agreed to allow pt to reside with her "until she gets back on her feet". Ms. Jans indicated that there are no weapons in the home and states that she does not have any safety concerns. CSW will continue to monitor.

## 2023-03-30 NOTE — Group Note (Signed)
Recreation Therapy Group Note   Group Topic:Stress Management  Group Date: 03/30/2023 Start Time: 0930 End Time: 0950 Facilitators: Makiya Jeune-McCall, LRT,CTRS Location: [address] Dayroom   Group Topic: Stress Management   Goal Area(s) Addresses:  Patient will actively participate in stress management techniques presented during session.  Patient will successfully identify benefit of practicing stress management post d/c.   Intervention: Relaxation exercise with ambient sound and script   Group Description: Guided Imagery. LRT provided education, instruction, and demonstration on practice of visualization via guided imagery. Patient was asked to participate in the technique introduced during session. LRT debriefed including topics of mindfulness, stress management and specific scenarios each patient could use these techniques. Patients were given suggestions of ways to access scripts post d/c and encouraged to explore Youtube and other apps available on smartphones, tablets, and computers.  Education:  Stress Management, Discharge Planning.   Education Outcome: Acknowledges education   Affect/Mood: N/A   Participation Level: Did not attend    Clinical Observations/Individualized Feedback:     Plan: Continue to engage patient in RT group sessions 2-3x/week.   Arnav Cregg-McCall, LRT,CTRS 03/30/2023 12:45 PM

## 2023-03-31 DIAGNOSIS — F314 Bipolar disorder, current episode depressed, severe, without psychotic features: Secondary | ICD-10-CM | POA: Diagnosis not present

## 2023-03-31 MED ORDER — TRAZODONE HCL 50 MG PO TABS: 50.0000 mg | ORAL_TABLET | Freq: Every evening | ORAL | 0 refills | Status: AC | PRN

## 2023-03-31 MED ORDER — VITAMIN D3 25 MCG PO TABS: 1000.0000 [IU] | ORAL_TABLET | Freq: Every day | ORAL | 0 refills | Status: AC

## 2023-03-31 MED ORDER — TRIAMCINOLONE ACETONIDE 0.5 % EX OINT: TOPICAL_OINTMENT | Freq: Two times a day (BID) | CUTANEOUS | 0 refills | Status: AC

## 2023-03-31 MED ORDER — HYDROXYZINE HCL 25 MG PO TABS: 25.0000 mg | ORAL_TABLET | Freq: Three times a day (TID) | ORAL | 0 refills | Status: AC | PRN

## 2023-03-31 MED ORDER — ARIPIPRAZOLE 10 MG PO TABS: 10.0000 mg | ORAL_TABLET | Freq: Every day | ORAL | 0 refills | Status: AC

## 2023-03-31 NOTE — Plan of Care (Signed)

## 2023-03-31 NOTE — Progress Notes (Signed)

## 2023-03-31 NOTE — Progress Notes (Signed)
Patient ID: Deanna [name], female   DOB: 07-30-1995, 27 y.o.   MRN: [id number] Order received for patients discharge. AVS instructions reviewed with patient at length. Patient denies any SI HI or AV Hallucinations. No signs of acute decompensation. Pt verbalized understanding of follow up plan. Pt escorted from unit to lobby to care of family.

## 2023-03-31 NOTE — BHH Suicide Risk Assessment (Addendum)
Suicide Risk Assessment  Discharge Assessment    [facility] Discharge Suicide Risk Assessment   Principal Problem: Bipolar disorder [facility]) Discharge Diagnoses: Principal Problem:   Bipolar disorder (HCC)  Total Time spent with patient: 30 minutes  Musculoskeletal: Strength & Muscle Tone: within normal limits Gait & Station: normal Patient leans: N/A  Reason for admission: Deanna [name] is a 27 year old AA female with prior psychiatric history significant for bipolar disorder depressed type, suicide attempts with OD on medication, GAD who presents voluntarily to [facility] from [facility] health ED at [facility] for overdose with unresponsiveness with unknown substance in the context of break-up with her significant other. After medical evaluation/stabilization & clearance, she was transferred to the [facility] for further psychiatric evaluation & treatments.   Psychiatric Specialty Exam  Presentation  General Appearance:  Appropriate for Environment; Casual; Fairly Groomed  Eye Contact: Good  Speech: Clear and Coherent; Normal Rate  Speech Volume: Normal  Handedness: Right   Mood and Affect  Mood: Euthymic  Duration of Depression Symptoms: No data recorded Affect: Congruent   Thought Process  Thought Processes: Coherent  Descriptions of Associations:Intact  Orientation:Full (Time, Place and Person)  Thought Content:Logical  History of Schizophrenia/Schizoaffective disorder:No data recorded Duration of Psychotic Symptoms:No data recorded Hallucinations:Hallucinations: None  Ideas of Reference:None  Suicidal Thoughts:Suicidal Thoughts: No  Homicidal Thoughts:Homicidal Thoughts: No   Sensorium  Memory: Immediate Good; Recent Good  Judgment: Fair  Insight: Fair  Art therapist  Concentration: Good  Attention Span: Good  Recall: Fair  Fund of Knowledge: Fair  Language: Good  Psychomotor Activity  Psychomotor Activity: Psychomotor  Activity: Normal  Assets  Assets: Communication Skills; Desire for Improvement; Housing  Sleep  Sleep: Sleep: Good Number of Hours of Sleep: 9  Physical Exam: Physical Exam Vitals and nursing note reviewed.  HENT:     Head: Normocephalic.     Nose: Nose normal.     Mouth/Throat:     Mouth: Mucous membranes are moist.  Eyes:     Extraocular Movements: Extraocular movements intact.  Cardiovascular:     Rate and Rhythm: Tachycardia present.     Comments: Blood pressure 100/66, pulse 126.  Nursing staff to recheck vital signs prior to discharge Pulmonary:     Effort: Pulmonary effort is normal.  Abdominal:     Comments: Deferred  Genitourinary:    Comments: Deferred Musculoskeletal:        General: Normal range of motion.     Cervical back: Normal range of motion.  Skin:    General: Skin is warm.  Neurological:     General: No focal deficit present.     Mental Status: She is alert and oriented to person, place, and time.  Psychiatric:        Mood and Affect: Mood normal.        Behavior: Behavior normal.        Thought Content: Thought content normal.        Judgment: Judgment normal.    Review of Systems  Constitutional:  Negative for chills and fever.  HENT:  Negative for sore throat.   Eyes:  Negative for blurred vision.  Respiratory:  Negative for cough, shortness of breath and wheezing.   Cardiovascular:  Negative for chest pain and palpitations.  Gastrointestinal:  Negative for abdominal pain, heartburn, nausea and vomiting.  Genitourinary:  Negative for dysuria, frequency and urgency.  Musculoskeletal:  Negative for back pain, falls, joint pain, myalgias and neck pain.  Neurological:  Negative  for dizziness, tingling, tremors, sensory change and headaches.  Endo/Heme/Allergies:        See allergy listing  Psychiatric/Behavioral:  Positive for depression (Stable with medication). The patient is nervous/anxious (Improved with medication) and has insomnia  (Improved with medication).    Blood pressure 100/66, pulse (!) 126, temperature 98.4 F (36.9 C), temperature source Oral, resp. rate 16, height 5\' 3"  (1.6 m), weight 74.5 kg, SpO2 100%. Body mass index is 29.09 kg/m.  Mental Status Per Nursing Assessment::   On Admission:  NA  Demographic Factors:  Adolescent or young adult, Gay, lesbian, or bisexual orientation, and Low socioeconomic status  Loss Factors: Decrease in vocational status, Loss of significant relationship, and Financial problems/change in socioeconomic status  Historical Factors: Prior suicide attempts and Impulsivity  Risk Reduction Factors:   Employed, Living with another person, especially a relative, Positive social support, Positive therapeutic relationship, and Positive coping skills or problem solving skills  Continued Clinical Symptoms:  Depression:   Impulsivity Recent sense of peace/wellbeing Unstable or Poor Therapeutic Relationship Previous Psychiatric Diagnoses and Treatments  Cognitive Features That Contribute To Risk:  Polarized thinking    Suicide Risk:  Mild:  There are no identifiable plans, no associated intent, mild dysphoria and related symptoms, good self-control (both objective and subjective assessment), few other risk factors, and identifiable protective factors, including available and accessible social support.   Follow-up Information     [facility] Follow up on 04/01/2023.   Why: You have a hospital follow up appointment on 04/01/23 at 11:00 am, Virtual telehealth, to obtain therapy and medication management services. Contact information: [address], [place], [place] [address]  Phone: [phone]                Plan Of Care/Follow-up recommendations:  Discharge Recommendations:   The patient is being discharged to her home. Patient is to take her discharge medications as ordered.  See follow up above.We recommend that she participates in individual  therapy to target uncontrollable agitation and substance abuse.  We recommend that she participates in therapy to target the conflict with her family, to improve communication skills and conflict resolution skills.  patient is to initiate/implement a contingency based behavioral model to address patient's behavior. We recommend that she gets AIMS scale, height, weight, blood pressure, fasting lipid panel, fasting blood sugar in three months from discharge if she's on atypical antipsychotics.  Patient will benefit from monitoring of recurrent suicidal ideation since patient is on antidepressant medication. The patient should abstain from all illicit substances and alcohol. If the patient's symptoms worsen or do not continue to improve or if the patient becomes actively suicidal or homicidal then it is recommended that the patient return to the closest hospital emergency room or call 911 for further evaluation and treatment. National Suicide Prevention Lifeline 1800-SUICIDE or (256)308-0916. Please follow up with your primary medical doctor for all other medical needs.  The patient has been educated on the possible side effects to medications and she/her guardian is to contact a medical professional and inform outpatient provider of any new side effects of medication. She is to take regular diet and activity as tolerated.  Will benefit from moderate daily exercise. Patient was educated about removing/locking any firearms, medications or dangerous products from the home.  Activity:  As tolerated Diet:  Regular Diet  Cecilie Lowers, FNP 03/31/2023, 11:13 AM

## 2023-03-31 NOTE — Progress Notes (Signed)
  [facility] Adult Case Management Discharge Plan :  Will you be returning to the same living situation after discharge:  No. With Mother Margy Clarks [phone] At discharge, do you have transportation home?: Yes,  Mother Do you have the ability to pay for your medications: Yes,  Insured  Release of information consent forms completed and in the chart;  Patient's signature needed at discharge.  Patient to Follow up at:  Follow-up Information     [facility] Follow up on 04/01/2023.   Why: You have a hospital follow up appointment on 04/01/23 at 11:00 am, Virtual telehealth, to obtain therapy and medication management services. Contact information: [address] [place], [place] [address]  Phone: [phone]                Next level of care provider has access to [facility] Link:no  Safety Planning and Suicide Prevention discussed: Yes,  Margy Clarks     Has patient been referred to the Quitline?: Patient refused referral for treatment  Patient has been referred for addiction treatment: Yes, the patient will follow up with an outpatient provider for substance use disorder. Psychiatrist/APP: appointment made and Therapist: appointment made Patient to continue working towards treatment goals after discharge. Patient no longer meets criteria for inpatient criteria per attending physician. Continue taking medications as prescribed, nursing to provide instructions at discharge. Follow up with all scheduled appointments.   Jadamarie Butson S Valentine Barney, LCSW 03/31/2023, 9:43 AM

## 2023-03-31 NOTE — Discharge Summary (Addendum)
Physician Discharge Summary Note  Patient:  Deanna [name] is an 27 y.o., female MRN:  [id number] DOB:  1995/07/16 Patient phone:  [phone] (home)  Patient address:   [address] [place] [address],  Total Time spent with patient: 30 minutes  Date of Admission:  03/22/2023 Date of Discharge:   03/31/2023  Reason for Admission:  : Deanna [name] is a 27 year old AA female with prior psychiatric history significant for bipolar disorder depressed type, suicide attempts with OD on medication, GAD who presents voluntarily to [facility] from [facility] health ED at [facility] for overdose with unresponsiveness with unknown substance in the context of break-up with her significant other. After medical evaluation/stabilization & clearance, she was transferred to the [facility] for further psychiatric evaluation & treatments.   Principal Problem: Bipolar disorder [facility]) Discharge Diagnoses: Principal Problem:   Bipolar disorder [facility])   Past Psychiatric History: Previous Psych Diagnoses: Bipolar disorder, SI with attempted self injury. Prior inpatient treatment: Yes, in 2018 at the [facility] Current/prior outpatient treatment: Denies Prior rehab hx: Denies Psychotherapy hx: Yes History of suicide: Yes x 1 in 2018 History of homicide or aggression: Denies Psychiatric medication history: Yes, patient has prior history of Remeron, Lamictal, and hydroxyzine Psychiatric medication compliance history: Yes, patient reports compliance Neuromodulation history: Denies Current Psychiatrist: Denies current psychiatrist Current therapist: Denies current therapist  Past Medical History:  Past Medical History:  Diagnosis Date   Allergy    Anxiety    Asthma    Depression    Eczema    Ovarian cyst    resolved    Past Surgical History:  Procedure Laterality Date   OVARIAN CYST REMOVAL     Family History:  Family History  Problem Relation Age of Onset   Asthma Mother    Bipolar disorder  Mother    Asthma Father    Hypertension Father    Bipolar disorder Father    Hypertension Paternal Grandmother    Diabetes Paternal Grandmother    Asthma Paternal Grandmother    Family Psychiatric  History: See H&P  Social History:  Social History   Substance and Sexual Activity  Alcohol Use No     Social History   Substance and Sexual Activity  Drug Use No    Social History   Socioeconomic History   Marital status: Significant Other    Spouse name: Not on file   Number of children: Not on file   Years of education: Not on file   Highest education level: Not on file  Occupational History   Not on file  Tobacco Use   Smoking status: Every Day    Current packs/day: 0.50    Types: Cigarettes   Smokeless tobacco: Never  Vaping Use   Vaping status: Every Day  Substance and Sexual Activity   Alcohol use: No   Drug use: No   Sexual activity: Yes    Partners: Female    Birth control/protection: None  Other Topics Concern   Not on file  Social History Narrative   Not on file   Social Determinants of Health   Financial Resource Strain: Not on file  Food Insecurity: Patient Declined (03/22/2023)   Hunger Vital Sign    Worried About Running Out of Food in the Last Year: Patient declined    Ran Out of Food in the Last Year: Patient declined  Transportation Needs: Patient Declined (03/22/2023)   PRAPARE - Administrator, Civil Service (Medical):  Patient declined    Lack of Transportation (Non-Medical): Patient declined  Physical Activity: Not on file  Stress: Not on file  Social Connections: Not on file   Hospital Course:  During the patient's hospitalization, patient had extensive initial psychiatric evaluation, and follow-up psychiatric evaluations every day.  Psychiatric diagnoses provided upon initial assessment:   Bipolar disorder (HCC)   Suicide attempt via OD   GAD   H/o SA via OD in 2018  Patient's psychiatric medications were adjusted on  admission: Initiate Abilify 5 mg p.o. daily for mood stabilization Continue hydroxyzine tablets 25 mg p.o. 3 times daily as needed for anxiety Continue trazodone tablets 50 mg p.o. at bedtime as needed for insomnia Resume home meds colecalciferol 25 mg 1 tablet 1000 unit total p.o. daily for vitamin D deficiency Initiates potassium chloride 40 mEq p.o. x 1 dose only for K+ 3.3  During the hospitalization, other adjustments were made to the patient's psychiatric medication regimen:  Abilify was increased to 10 mg p.o. daily for mood stabilization  Patient's care was discussed during the interdisciplinary team meeting every day during the hospitalization.  The patient denies having side effects to prescribed psychiatric medication.  Gradually, patient started adjusting to milieu. The patient was evaluated each day by a clinical provider to ascertain response to treatment. Improvement was noted by the patient's report of decreasing symptoms, improved sleep and appetite, affect, medication tolerance, behavior, and participation in unit programming.  Patient was asked each day to complete a self inventory noting mood, mental status, pain, new symptoms, anxiety and concerns.    Symptoms were reported as significantly decreased or resolved completely by discharge.   On day of discharge, the patient reports that their mood is stable. The patient denied having suicidal thoughts for more than 48 hours prior to discharge.  Patient denies having homicidal thoughts.  Patient denies having auditory hallucinations.  Patient denies any visual hallucinations or other symptoms of psychosis. The patient was motivated to continue taking medication with a goal of continued improvement in mental health.   The patient reports their target psychiatric symptoms of bipolar disorder responded well to the psychiatric medications, and the patient reports overall benefit other psychiatric hospitalization. Supportive  psychotherapy was provided to the patient. The patient also participated in regular group therapy while hospitalized. Coping skills, problem solving as well as relaxation therapies were also part of the unit programming.  Labs were reviewed with the patient, and abnormal results were discussed with the patient.  The patient is able to verbalize their individual safety plan to this provider.  # It is recommended to the patient to continue psychiatric medications as prescribed, after discharge from the hospital.    # It is recommended to the patient to follow up with your outpatient psychiatric provider and PCP.  # It was discussed with the patient, the impact of alcohol, drugs, tobacco have been there overall psychiatric and medical wellbeing, and total abstinence from substance use was recommended the patient.ed.  # Prescriptions provided or sent directly to preferred pharmacy at discharge. Patient agreeable to plan. Given opportunity to ask questions. Appears to feel comfortable with discharge.    # In the event of worsening symptoms, the patient is instructed to call the crisis hotline, 911 and or go to the nearest ED for appropriate evaluation and treatment of symptoms. To follow-up with primary care provider for other medical issues, concerns and or health care needs  # Patient was discharged to home with a plan to  follow up as noted below.   Physical Findings: AIMS:  , ,  ,  ,    CIWA:    COWS:     Musculoskeletal: Strength & Muscle Tone: within normal limits Gait & Station: normal Patient leans: N/A  Psychiatric Specialty Exam:  Presentation  General Appearance:  Appropriate for Environment; Casual; Fairly Groomed  Eye Contact: Good  Speech: Clear and Coherent; Normal Rate  Speech Volume: Normal  Handedness: Right  Mood and Affect  Mood: Euthymic  Affect: Congruent  Thought Process  Thought Processes: Coherent  Descriptions of  Associations:Intact  Orientation:Full (Time, Place and Person)  Thought Content:Logical  History of Schizophrenia/Schizoaffective disorder:No data recorded Duration of Psychotic Symptoms:No data recorded Hallucinations:Hallucinations: None  Ideas of Reference:None  Suicidal Thoughts:Suicidal Thoughts: No  Homicidal Thoughts:Homicidal Thoughts: No  Sensorium  Memory: Immediate Good; Recent Good  Judgment: Fair  Insight: Fair  Art therapist  Concentration: Good  Attention Span: Good  Recall: Fair  Fund of Knowledge: Fair  Language: Good  Psychomotor Activity  Psychomotor Activity: Psychomotor Activity: Normal  Assets  Assets: Communication Skills; Desire for Improvement; Housing  Sleep  Sleep: Sleep: Good Number of Hours of Sleep: 9  Physical Exam: Physical Exam Vitals and nursing note reviewed.  HENT:     Head: Normocephalic.     Nose: Nose normal.     Mouth/Throat:     Mouth: Mucous membranes are moist.  Eyes:     Extraocular Movements: Extraocular movements intact.  Cardiovascular:     Rate and Rhythm: Tachycardia present.  Pulmonary:     Effort: Pulmonary effort is normal.  Abdominal:     Comments: Deferred  Genitourinary:    Comments: Deferred Musculoskeletal:        General: Normal range of motion.     Cervical back: Normal range of motion.  Skin:    General: Skin is warm.  Neurological:     General: No focal deficit present.     Mental Status: She is alert and oriented to person, place, and time.  Psychiatric:        Mood and Affect: Mood normal.        Behavior: Behavior normal.        Thought Content: Thought content normal.    Review of Systems  Constitutional:  Negative for chills and fever.  HENT:  Negative for hearing loss and sore throat.   Eyes:  Negative for blurred vision.  Respiratory:  Negative for cough, shortness of breath and wheezing.   Cardiovascular:  Negative for chest pain and palpitations.   Gastrointestinal:  Negative for abdominal pain, heartburn, nausea and vomiting.  Genitourinary:  Negative for dysuria, frequency and urgency.  Musculoskeletal: Negative.   Skin:  Negative for itching and rash.  Neurological:  Negative for dizziness, tingling, tremors, sensory change and headaches.  Endo/Heme/Allergies:        See allergy listing  Psychiatric/Behavioral:  Negative for depression and suicidal ideas. The patient is nervous/anxious (Improved with medication) and has insomnia (Improved with medication).    Blood pressure 100/66, pulse (!) 126, temperature 98.4 F (36.9 C), temperature source Oral, resp. rate 16, height 5\' 3"  (1.6 m), weight 74.5 kg, SpO2 100%. Body mass index is 29.09 kg/m.   Social History   Tobacco Use  Smoking Status Every Day   Current packs/day: 0.50   Types: Cigarettes  Smokeless Tobacco Never   Tobacco Cessation:  A prescription for an FDA-approved tobacco cessation medication was offered at discharge and the patient  refused  Blood Alcohol level:  Lab Results  Component Value Date   ETH <10 03/19/2023   ETH <5 06/01/2016   Metabolic Disorder Labs:  Lab Results  Component Value Date   HGBA1C 5.1 03/24/2023   MPG 99.67 03/24/2023   No results found for: "PROLACTIN" Lab Results  Component Value Date   CHOL 188 03/24/2023   TRIG 107 03/24/2023   HDL 40 (L) 03/24/2023   CHOLHDL 4.7 03/24/2023   VLDL 21 03/24/2023   LDLCALC 127 (H) 03/24/2023    See Psychiatric Specialty Exam and Suicide Risk Assessment completed by Attending Physician prior to discharge.  Discharge destination:  Home  Is patient on multiple antipsychotic therapies at discharge:  No   Has Patient had three or more failed trials of antipsychotic monotherapy by history:  No  Recommended Plan for Multiple Antipsychotic Therapies: NA  Discharge Instructions     Diet - low sodium heart healthy   Complete by: As directed    Increase activity slowly   Complete by:  As directed       Allergies as of 03/31/2023       Reactions   Dairy Aid [tilactase] Hives      Peanut (diagnostic) Other (See Comments)   Unknown reaction   Penicillins Rash        Medication List     TAKE these medications      Indication  ARIPiprazole 10 MG tablet Commonly known as: ABILIFY Take 1 tablet (10 mg total) by mouth daily. Start taking on: April 01, 2023  Indication: Major Depressive Disorder   hydrOXYzine 25 MG tablet Commonly known as: ATARAX Take 1 tablet (25 mg total) by mouth 3 (three) times daily as needed for anxiety.  Indication: Feeling Anxious   traZODone 50 MG tablet Commonly known as: DESYREL Take 1 tablet (50 mg total) by mouth at bedtime as needed for sleep.  Indication: Trouble Sleeping   triamcinolone ointment 0.5 % Commonly known as: KENALOG Apply topically 2 (two) times daily.  Indication: Atopic Dermatitis   vitamin D3 25 MCG tablet Commonly known as: CHOLECALCIFEROL Take 1 tablet (1,000 Units total) by mouth daily.  Indication: Vitamin D Deficiency        Follow-up Information     [facility] Follow up on 04/01/2023.   Why: You have a hospital follow up appointment on 04/01/23 at 11:00 am, Virtual telehealth, to obtain therapy and medication management services. Contact information: [address], [place], [place] [address]  Phone: [phone]               Follow-up recommendations:   Discharge Recommendations:  Discharge Recommendations:  The patient is being discharged to her home. Patient is to take her discharge medications as ordered.  See follow up above.We recommend that she participates in individual therapy to target uncontrollable agitation and substance abuse.  We recommend that she participates in therapy to target the conflict with her family, to improve communication skills and conflict resolution skills.  patient is to initiate/implement a contingency based behavioral model to  address patient's behavior. We recommend that she gets AIMS scale, height, weight, blood pressure, fasting lipid panel, fasting blood sugar in three months from discharge if she's on atypical antipsychotics.  Patient will benefit from monitoring of recurrent suicidal ideation since patient is on antidepressant medication. The patient should abstain from all illicit substances and alcohol. If the patient's symptoms worsen or do not continue to improve or if the patient becomes actively suicidal  or homicidal then it is recommended that the patient return to the closest hospital emergency room or call 911 for further evaluation and treatment. National Suicide Prevention Lifeline 1800-SUICIDE or 480 803 0499. Please follow up with your primary medical doctor for all other medical needs.  The patient has been educated on the possible side effects to medications and she/her guardian is to contact a medical professional and inform outpatient provider of any new side effects of medication. She is to take regular diet and activity as tolerated.  Will benefit from moderate daily exercise. Patient was educated about removing/locking any firearms, medications or dangerous products from the home.  Activity:  As tolerated Diet:  Regular Diet   Signed: Cecilie Lowers, FNP 03/31/2023, 11:25 AM

## 2023-05-04 ENCOUNTER — Ambulatory Visit (HOSPITAL_COMMUNITY): Payer: Medicaid Other | Admitting: Psychiatry

## 2023-05-07 ENCOUNTER — Encounter (HOSPITAL_BASED_OUTPATIENT_CLINIC_OR_DEPARTMENT_OTHER): Payer: Self-pay

## 2023-05-07 ENCOUNTER — Emergency Department (HOSPITAL_BASED_OUTPATIENT_CLINIC_OR_DEPARTMENT_OTHER)
Admission: EM | Admit: 2023-05-07 | Discharge: 2023-05-07 | Disposition: A | Discharge status: Home / Self Care | Payer: Medicaid Other | Attending: Emergency Medicine | Admitting: Emergency Medicine

## 2023-05-07 ENCOUNTER — Other Ambulatory Visit: Payer: Self-pay

## 2023-05-07 DIAGNOSIS — Z20828 Contact with and (suspected) exposure to other viral communicable diseases: Secondary | ICD-10-CM | POA: Diagnosis not present

## 2023-05-07 DIAGNOSIS — J45909 Unspecified asthma, uncomplicated: Secondary | ICD-10-CM | POA: Diagnosis not present

## 2023-05-07 DIAGNOSIS — Z202 Contact with and (suspected) exposure to infections with a predominantly sexual mode of transmission: Secondary | ICD-10-CM | POA: Diagnosis not present

## 2023-05-07 DIAGNOSIS — Z91010 Allergy to peanuts: Secondary | ICD-10-CM | POA: Diagnosis not present

## 2023-05-07 DIAGNOSIS — B2790 Infectious mononucleosis, unspecified without complication: Secondary | ICD-10-CM | POA: Diagnosis not present

## 2023-05-07 LAB — MONONUCLEOSIS SCREEN: Mono Screen: NEGATIVE

## 2023-05-07 NOTE — ED Triage Notes (Signed)
Pt reports she was sexually involved with a girl who told her she tested positive for gonorrhea and mono. She would like to be tested for these. Denies any symptoms.

## 2023-05-07 NOTE — Discharge Instructions (Addendum)
You were seen in the emergency department with concern for STD.  As we discussed, we have sent testing for gonorrhea, chlamydia, and mononucleosis.   You can sign up for [facility] MyChart to access your medical records and test results using this link: [email or web address]  If any of your testing is positive, you should make any and all sexual partners aware so they can get tested/treated. If your testing is positive, please go to the health department or clinic for necessary treatment. I've attached the address to the [facility] Department.   Please refrain from sexual intercourse until you no longer have symptoms or have finished your antibiotic course.   Continue to monitor how you're doing and return to the ER for new or worsening symptoms such as fevers, worsening pain/discomfort.

## 2023-05-07 NOTE — ED Provider Notes (Signed)
[place] [facility] Provider Note   CSN: [id number] Arrival date & time: 05/07/23  2201     History  Chief Complaint  Patient presents with   Exposure to STD    Deanna [name] is a 27 y.o. female with history of bipolar 1 disorder, eczema, asthma, ovarian cyst, anxiety, depression, who presents the emergency department with concern for exposure to STD.  Patient states that she was recently sexually involved with a partner who tested positive for gonorrhea and mononucleosis.  Patient would like to be tested for these.  She is not having any symptoms.   Exposure to STD       Home Medications Prior to Admission medications   Medication Sig Start Date End Date Taking? Authorizing Provider  ARIPiprazole (ABILIFY) 10 MG tablet Take 1 tablet (10 mg total) by mouth daily. 04/01/23   Ntuen, Jesusita Oka, FNP  hydrOXYzine (ATARAX) 25 MG tablet Take 1 tablet (25 mg total) by mouth 3 (three) times daily as needed for anxiety. 03/31/23   Ntuen, Jesusita Oka, FNP  traZODone (DESYREL) 50 MG tablet Take 1 tablet (50 mg total) by mouth at bedtime as needed for sleep. 03/31/23   Cecilie Lowers, FNP  triamcinolone ointment (KENALOG) 0.5 % Apply topically 2 (two) times daily. 03/31/23   Cecilie Lowers, FNP      Allergies    Dairy aid [tilactase], Peanut (diagnostic), and Penicillins    Review of Systems   Review of Systems  All other systems reviewed and are negative.   Physical Exam Updated Vital Signs BP 111/85 (BP Location: Left Arm)   Pulse (!) 106   Temp 98.6 F (37 C) (Oral)   Resp 16   Ht 5\' 3"  (1.6 m)   Wt 68 kg   LMP 05/07/2023 (Approximate)   SpO2 100%   BMI 26.57 kg/m  Physical Exam Vitals and nursing note reviewed.  Constitutional:      Appearance: Normal appearance.  HENT:     Head: Normocephalic and atraumatic.  Eyes:     Conjunctiva/sclera: Conjunctivae normal.  Pulmonary:     Effort: Pulmonary effort is normal. No respiratory distress.   Skin:    General: Skin is warm and dry.  Neurological:     Mental Status: She is alert.  Psychiatric:        Mood and Affect: Mood normal.        Behavior: Behavior normal.     ED Results / Procedures / Treatments   Labs (all labs ordered are listed, but only abnormal results are displayed) Labs Reviewed  MONONUCLEOSIS SCREEN  GC/CHLAMYDIA PROBE AMP ([place]) NOT AT [facility]    EKG None  Radiology No results found.  Procedures Procedures    Medications Ordered in ED Medications - No data to display  ED Course/ Medical Decision Making/ A&P                                 Medical Decision Making Amount and/or Complexity of Data Reviewed Labs: ordered.  This patient is a 27 y.o. female  who presents to the ED for exposure to STD and mononucleosis.   Past Medical History / Co-morbidities / Social History: bipolar 1 disorder, eczema, asthma, ovarian cyst, anxiety, depression Allergy to penicillins  Physical Exam: Physical exam performed. The pertinent findings include: Normal vitals, no acute distress.  Patient is okay with self swabbing and  we will defer GU exam at this time.  Lab Tests/Imaging studies: I personally interpreted labs/imaging and the pertinent results include: G/C and mononucleosis testing pending.  Disposition: After consideration of the diagnostic results and the patients response to treatment, I feel that emergency department workup does not suggest an emergent condition requiring admission or immediate intervention beyond what has been performed at this time. Patient understands that they have mononucleosis testing and G/C cultures pending and they will need to inform all sexual partners if results are positive and seek treatment at the health department. Discussed importance of using protection when sexually active. The patient is safe for discharge and has been instructed to return immediately for worsening symptoms, change in symptoms or any  other concerns.  Final Clinical Impression(s) / ED Diagnoses Final diagnoses:  Exposure to STD  Exposure to mononucleosis syndrome    Rx / DC Orders ED Discharge Orders     None      Portions of this report may have been transcribed using voice recognition software. Every effort was made to ensure accuracy; however, inadvertent computerized transcription errors may be present.    Jeanella Flattery 05/07/23 2306    Tegeler, Canary Brim, MD 05/07/23 2312

## 2023-05-11 LAB — GC/CHLAMYDIA PROBE AMP (~~LOC~~) NOT AT ARMC
Chlamydia: NEGATIVE
Comment: NEGATIVE
Comment: NORMAL
Neisseria Gonorrhea: NEGATIVE

## 2023-06-09 ENCOUNTER — Ambulatory Visit (HOSPITAL_COMMUNITY): Payer: Medicaid Other | Admitting: Mental Health
